# Patient Record
Sex: Female | Born: 1945
Health system: Southern US, Community
[De-identification: ages and names within clinical notes are randomized; demographics above are authoritative.]

## PROBLEM LIST (undated history)

## (undated) DIAGNOSIS — E785 Hyperlipidemia, unspecified: Secondary | ICD-10-CM

## (undated) DIAGNOSIS — R7303 Prediabetes: Secondary | ICD-10-CM

## (undated) DIAGNOSIS — E039 Hypothyroidism, unspecified: Secondary | ICD-10-CM

## (undated) DIAGNOSIS — F329 Major depressive disorder, single episode, unspecified: Secondary | ICD-10-CM

## (undated) DIAGNOSIS — Z9889 Other specified postprocedural states: Secondary | ICD-10-CM

## (undated) DIAGNOSIS — I4510 Unspecified right bundle-branch block: Secondary | ICD-10-CM

## (undated) DIAGNOSIS — M1990 Unspecified osteoarthritis, unspecified site: Secondary | ICD-10-CM

## (undated) DIAGNOSIS — T7840XA Allergy, unspecified, initial encounter: Secondary | ICD-10-CM

## (undated) DIAGNOSIS — M8580 Other specified disorders of bone density and structure, unspecified site: Secondary | ICD-10-CM

## (undated) DIAGNOSIS — G250 Essential tremor: Secondary | ICD-10-CM

## (undated) DIAGNOSIS — F32A Depression, unspecified: Secondary | ICD-10-CM

## (undated) DIAGNOSIS — I201 Angina pectoris with documented spasm: Secondary | ICD-10-CM

## (undated) DIAGNOSIS — F419 Anxiety disorder, unspecified: Secondary | ICD-10-CM

## (undated) DIAGNOSIS — I6529 Occlusion and stenosis of unspecified carotid artery: Secondary | ICD-10-CM

## (undated) DIAGNOSIS — R519 Headache, unspecified: Secondary | ICD-10-CM

## (undated) DIAGNOSIS — I2510 Atherosclerotic heart disease of native coronary artery without angina pectoris: Secondary | ICD-10-CM

## (undated) DIAGNOSIS — I10 Essential (primary) hypertension: Secondary | ICD-10-CM

## (undated) DIAGNOSIS — R112 Nausea with vomiting, unspecified: Secondary | ICD-10-CM

## (undated) HISTORY — DX: Hyperlipidemia, unspecified: E78.5

## (undated) HISTORY — DX: Essential tremor: G25.0

## (undated) HISTORY — PX: TYMPANOSTOMY TUBE PLACEMENT: SHX32

## (undated) HISTORY — DX: Occlusion and stenosis of unspecified carotid artery: I65.29

## (undated) HISTORY — PX: TOTAL ABDOMINAL HYSTERECTOMY W/ BILATERAL SALPINGOOPHORECTOMY: SHX83

## (undated) HISTORY — DX: Major depressive disorder, single episode, unspecified: F32.9

## (undated) HISTORY — DX: Allergy, unspecified, initial encounter: T78.40XA

## (undated) HISTORY — PX: KNEE SURGERY: SHX244

## (undated) HISTORY — DX: Angina pectoris with documented spasm: I20.1

## (undated) HISTORY — DX: Depression, unspecified: F32.A

## (undated) HISTORY — DX: Hypothyroidism, unspecified: E03.9

## (undated) HISTORY — DX: Essential (primary) hypertension: I10

## (undated) HISTORY — PX: THYROIDECTOMY: SHX17

## (undated) HISTORY — DX: Other specified disorders of bone density and structure, unspecified site: M85.80

## (undated) HISTORY — PX: CATARACT EXTRACTION W/ INTRAOCULAR LENS IMPLANT: SHX1309

## (undated) HISTORY — DX: Unspecified right bundle-branch block: I45.10

---

## 2002-05-19 ENCOUNTER — Encounter: Payer: Self-pay | Admitting: Emergency Medicine

## 2002-05-19 ENCOUNTER — Emergency Department (HOSPITAL_COMMUNITY): Admission: EM | Admit: 2002-05-19 | Discharge: 2002-05-19 | Payer: Self-pay | Admitting: Emergency Medicine

## 2002-12-28 ENCOUNTER — Ambulatory Visit (HOSPITAL_COMMUNITY): Admission: RE | Admit: 2002-12-28 | Discharge: 2002-12-28 | Payer: Self-pay | Admitting: Family Medicine

## 2003-01-24 ENCOUNTER — Encounter: Admission: RE | Admit: 2003-01-24 | Discharge: 2003-01-24 | Payer: Self-pay | Admitting: Family Medicine

## 2003-02-01 ENCOUNTER — Ambulatory Visit (HOSPITAL_COMMUNITY): Admission: RE | Admit: 2003-02-01 | Discharge: 2003-02-01 | Payer: Self-pay | Admitting: Orthopedic Surgery

## 2005-07-23 ENCOUNTER — Encounter: Admission: RE | Admit: 2005-07-23 | Discharge: 2005-07-23 | Payer: Self-pay | Admitting: Family Medicine

## 2005-08-09 HISTORY — PX: OTHER SURGICAL HISTORY: SHX169

## 2006-05-10 ENCOUNTER — Encounter: Admission: RE | Admit: 2006-05-10 | Discharge: 2006-05-10 | Payer: Self-pay | Admitting: Family Medicine

## 2006-05-17 ENCOUNTER — Encounter: Admission: RE | Admit: 2006-05-17 | Discharge: 2006-05-17 | Payer: Self-pay | Admitting: Family Medicine

## 2007-02-14 ENCOUNTER — Encounter: Admission: RE | Admit: 2007-02-14 | Discharge: 2007-02-14 | Payer: Self-pay | Admitting: Occupational Medicine

## 2007-04-26 ENCOUNTER — Encounter: Admission: RE | Admit: 2007-04-26 | Discharge: 2007-04-26 | Payer: Self-pay | Admitting: Internal Medicine

## 2007-10-18 ENCOUNTER — Encounter: Admission: RE | Admit: 2007-10-18 | Discharge: 2007-12-16 | Payer: Self-pay | Admitting: Occupational Medicine

## 2008-01-07 ENCOUNTER — Ambulatory Visit: Payer: Self-pay | Admitting: Internal Medicine

## 2008-01-07 ENCOUNTER — Inpatient Hospital Stay (HOSPITAL_COMMUNITY): Admission: EM | Admit: 2008-01-07 | Discharge: 2008-01-10 | Payer: Self-pay | Admitting: Emergency Medicine

## 2008-01-09 ENCOUNTER — Ambulatory Visit: Payer: Self-pay | Admitting: Vascular Surgery

## 2008-01-10 HISTORY — PX: CARDIAC CATHETERIZATION: SHX172

## 2010-03-01 ENCOUNTER — Encounter: Payer: Self-pay | Admitting: Family Medicine

## 2010-06-24 NOTE — Consult Note (Signed)
NAMEMAYBREE, RILING              ACCOUNT NO.:  [id number]   MEDICAL RECORD NO.:  [id number]          PATIENT TYPE:  INP   LOCATION:  3734                         FACILITY:  [facility]   PHYSICIAN:  Armanda Magic, M.D.     DATE OF BIRTH:  1945/06/14   DATE OF CONSULTATION:  01/09/2008  DATE OF DISCHARGE:                                 CONSULTATION   REFERRING PHYSICIAN:  [facility], Dr. Rito Ehrlich.   CHIEF COMPLAINT:  Elevated troponin and cardiac enzymes.   HISTORY OF PRESENT ILLNESS:  This is a 65 year old white female with a  history of hypertension and dyslipidemia who was admitted to the  emergency room on January 06, 2008, with acute mental status changes.  Apparently, she was home watching her grandchildren and all of a sudden  she did not feel well.  Apparently, she had been having some  intermittent nausea for several days.  She said she got real upset  because her 55-year-old had a rash.  She was waiting for his mom to get  there to take him to the doctor.  She said all of a sudden she did not  feel well.  She called her daughter to come over and then she called the  EMS.  She did not have any syncopal episode and does not remember  anything though from the episode.  She says she did not remember having  any chest pain, but she said she told the EMS that she did have chest  pain.  She has not had any episodes of chest pain or shortness of breath  in the past.  Initially, she was admitted with a diagnosis of TIA, but  then was found to have elevated cardiac enzymes consistent with non-ST  elevation MI.  We are now asked to consult.   PAST MEDICAL HISTORY:  1. Hypertension.  2. Dyslipidemia.  3. Anxiety.  4. Hypothyroidism.   PAST SURGICAL HISTORY:  Thyroid surgery and total abdominal  hysterectomy.   SOCIAL HISTORY:  She lives alone.  She denies any tobacco or alcohol  use.  Nor drug use.   FAMILY HISTORY:  Her sister who is younger than her died of an MI.  She  was  a smoker.  Her brother also died of an MI.  She has a sister who had  a TIA.   ALLERGIES:  She has no known drug allergies.   MEDICATIONS AT HOME:  1. Propranolol 80 mg daily.  2. Aspirin.  3. Paxil 20 mg a day.  4. Simvastatin 40 mg a day.  5. Armour Thyroid 60 mg daily.  6. Lisinopril HCT 20/25 mg daily.   REVIEW OF SYSTEMS:  All 14 review of systems is negative except what is  stated in the HPI.   PHYSICAL EXAMINATION:  VITAL SIGNS:  Blood pressure 143/81, heart rate  87, respirations 20, and O2 saturations 94% on room air.  GENERAL:  A well-developed and well-nourished female in no acute  distress.  HEENT:  Benign.  NECK:  Supple without lymphadenopathy.  Carotid upstrokes +2  bilaterally.  No bruits.  LUNGS:  Clear to auscultation throughout.  HEART:  Regular rate and rhythm.  No murmurs, rubs, or gallops.  Normal  S1 and S2.  ABDOMEN:  Soft, nontender, and nondistended with active bowel sounds.  No hepatosplenomegaly.  No abdominal bruits.  No pulsatile masses.  EXTREMITIES:  No cyanosis, erythema, or edema.   EKG shows sinus rhythm with right bundle-branch block.  Head CT was  negative.  MRI/MRA of the head was negative as well.  UA was negative.   LABORATORY DATA:  CPK is 92, 105, 99, 159, 116, 91, 83.  MB 2.0, 5.6, 6,  10.9, 11.2, 5.7, 3.8.  Troponin 0.04, 0.48, 0.53, 1.25, 0.92, 0.58,  0.49.  D-dimer less than 0.22.  Sodium 140, potassium 3.0, chloride 103,  bicarb 27, BUN 6, creatinine 0.48, glucose was not obtained.  White cell  count 7.1, hemoglobin is 14.8, hematocrit 44.2, platelet count 207.   ASSESSMENT:  1. Acute mental status changes possibly secondary to non-ST-elevation      myocardial infarction.  2. Elevated cardiac enzymes with a questionable episode of chest pain      and also nausea for several days.  Cardiac enzymes have peaked and      are trending downward.  Enzyme pattern is consistent with a small      non-ST-elevation myocardial infarction.   The patient does have a      very strong family history of coronary disease with myocardial      infarctions resulting in death in her younger sister and brother.  3. Hypokalemia.  4. Hypertension.  5. Strong family history of coronary disease in early age.  6. Dyslipidemia.   PLAN:  1. Check 2D echocardiogram to evaluate LV function.  We will need cath      and plan for January 10, 2008, for cardiac catheterization to      evaluate coronary anatomy.  2. Check a BMET in the morning.  3. Continue beta-blocker.  4. Change DVT heparin to full dose heparin for non-ST-elevation MI per      pharmacy.  5. Add Aspirin 325 mg daily.  6. Hold hydrochlorothiazide.      Armanda Magic, M.D.  Electronically Signed     TT/MEDQ  D:  01/09/2008  T:  01/09/2008  Job:  [id number]   cc:   Donia Guiles, M.D.

## 2010-06-24 NOTE — Discharge Summary (Signed)
Vanessa Stewart, CRAYTON              ACCOUNT NO.:  [id number]   MEDICAL RECORD NO.:  [id number]          PATIENT TYPE:  INP   LOCATION:  3734                         FACILITY:  [facility]   PHYSICIAN:  Ramiro Harvest, MD    DATE OF BIRTH:  [birth year]   DATE OF ADMISSION:  01/06/2008  DATE OF DISCHARGE:  01/10/2008                               DISCHARGE SUMMARY   ATTENDING PHYSICIAN:  Ramiro Harvest, M.D.   PRIMARY CARE PHYSICIAN:  Donia Guiles, M.D.   DISCHARGE DIAGNOSES:  1. Altered mental status likely secondary to non-ST elevated      myocardial infarction versus transient ischemic attack.  2. Non-ST elevated myocardial infarction secondary to esophageal      spasm.  3. Esophageal spasm.  4. Hypertension.  5. Hyperlipidemia.  6. Anxiety.  7. Hypothyroidism.  8. Status post total abdominal hysterectomy.  9. Status post thyroid surgery.   DISCHARGE MEDICATIONS:  1. Norvasc 5 mg p.o. daily  2. Simvastatin 80 mg p.o. daily  3. Aspirin 325 mg p.o. daily  4. Paroxetine  20 mg p.o. daily.  5. Lisinopril/ HCTZ 20/25 one tablet p.o. daily.  6. Armour thyroid 60 mg p.o. daily.  7. Zyrtec take as previously taken.   DISPOSITION/FOLLOWUP:  The patient will be discharged home.  The patient  is to follow up with Dr. Mayford Knife of [facility] in 2 weeks.  The  patient is also to follow up with his primary care physician, Dr.  Arvilla Market in 1-2 weeks.  On followup, the patient's blood pressure needs  to be reassessed as a propranolol has been changed to Norvasc.  The patient will likely need a comprehensive metabolic profile done in  about 6 weeks to follow up on her LFTs as the patient's Zocor been  increased to 80 mg daily.   CONSULTATIONS:  A cardiology consult was done.  The patient was seen in  consultation by Dr. Armanda Magic of [facility] on December 23, 2007.   PROCEDURE PERFORMED:  1. A CT of the head without contrast was performed on January 06, 2008 that  showed no acute intracranial abnormality.  2. An MRI/MRA of the head and neck was performed on January 07, 2008      which showed no acute intracranial findings.  No change from prior      CT.  No vascular stenosis or occlusion. Negative MRA of the neck.  3. Carotid Dopplers were done on January 09, 2008, that showed mild      intimal thickening noted throughout with mild area of known      calcific plaque noted in the left distal CCA/bifurcation.      Vertebral artery flow is antegrade bilaterally.  No significant      proximal right ICA stenosis noted.  The distal ICA was difficult to      visualize.  No significant left ICA stenosis noted.  4. A 2-D echo was obtained on December 23, 2007 that showed overall      left ventricular systolic function was normal, EF of 55-60%.  There  was no left ventricular regional wall motion abnormalities.  Left      ventricular wall thickness was mildly to moderately increased.  The      left atrium was moderately dilated.  Right ventricular size was      upper limits of normal.  Estimated peak right ventricular systolic      pressure was within the upper limits of normal.  Estimated peak      right ventricular systolic pressure was in the range of 25-30 mmHg.      The right atrium was mildly dilated.  No obvious cardioembolic      source identified.  Consider TEE if clinically warranted.  5. A cardiac catheterization was done on January 10, 2008 that showed      some coronary vasal spasm, normal coronary arteries, normal left      ventricle.   BRIEF ADMISSION HISTORY AND PHYSICAL:  Ms. Vanessa Stewart is a 65-year-  old white female with history of hypertension and hyperlipidemia brought  in by EMS.  Not clear about the details of the event.  There were no  family members except for two grandchildren with her when the episode  happened, but based on the patchy history that was obtained from the  patient and the family members, it seemed as if  she had been feeling  nauseated for the last few days and on the day of admission was not  feeling well.  Apparently she called 9-1-1 and was found to be confused.  Was brought in by EMS.  The patient currently was at her baseline at the  time she was seen by the admitting physician and told the admitting  physician that she had no similar episodes in the past.  The patient  denied any nausea at that time.  Did have some nausea earlier which had  resolved with medication.  The patient denied any shortness of breath.  The patient denied any chest pain, no dizziness, no weakness of  extremities or problems swallowing.  No recent travels.  The patient did  tell the admitting physician that thyroid medication had recently been  changed to a generic prescription. The patient does check a blood  pressure at home and systolic was usually 130s with diastolic 70s to  16X.  The patient had told the admitting physician she had some carotid  Dopplers done by PCP.  However, was told that the plaque burden was  normal for age.   PHYSICAL EXAMINATION:  VITAL SIGNS:  Per admitting physician,  temperature 98, pulse of 69 to 78, respiratory rate 16-20, blood  pressure 134/68 which was down from 156/89, satting 98% on room air.  GENERAL:  The patient is a white female, looking younger than her stated  age, in no acute distress, alert and oriented.  HEENT: Normocephalic, atraumatic.  Pupils equal, round and reactive to  light and accommodation.  Extraocular movements intact.  Oropharynx was  clear.  No lesions.  No exudates.  Neck was supple.  No lymphadenopathy.  No icterus, no pallor noted.  CARDIOVASCULAR:  Regular rate and rhythm.  No murmurs heard.  RESPIRATORY:  Lungs were clear to auscultation bilaterally.  No wheezes,  no rhonchi.  No crackles.  ABDOMEN:  Soft, nontender, nondistended,  positive bowel sounds.  EXTREMITIES:  No clubbing, cyanosis or edema.  NEUROLOGIC:  The patient was alert and  oriented x3.  Cranial nerves II-  XII grossly intact.  No focal deficits.   ADMISSION LABORATORY DATA:  EKG with a right bundle branch block.  No  previous EKGs for comparison.  CBC showed white count 5.8, hemoglobin  14.9, hematocrit 43.5, platelets of 212,000.  Sodium 137, potassium 2.8,  chloride 101, bicarb 27, glucose 110, BUN 10, creatinine 0.5, calcium of  8.8, albumin of 4, AST 18%.  Cardiac enzymes were negative.  CT of the  head as stated above.  Urinalysis was negative for UTI.  Urine  toxicology was negative.  Alcohol level was less than 5.   HOSPITAL COURSE:  1. Altered mental status.  Initially it was felt that the patient's      altered mental status could likely be secondary to a TIA.  A stroke      workup was undertaken.  TSH levels were obtained that came back      negative.  Cardiac enzymes  were obtained which came back elevated.      A CT of the head was obtained with results as stated above. MRI was      also obtained with results as stated above.  Homocysteine level was      obtained with results of 7.5 which was within normal limits.  A D-      dimer was obtained which was negative.  The patient's alcohol level      was obtained which was less than 5.  The patient had returned to      her baseline mentation by the time she was seen by the admitting      physician.  The patient remained stable.  Cardiology was consulted      as it was felt that the patient's altered mental status may be      likely secondary to a non-ST elevated MI.  The patient was seen in      consultation by cardiology.  A fasting lipid panel was also      obtained which showed an increased LDL of 114.  As the patient had      had a possible TIA versus a non-ST elevated MI, it was noted that      the patient's Zocor needed to be increased to 80 mg daily with      follow-up as an outpatient with her PCP.  The patient remained      stable and asymptomatic throughout the rest of the  hospitalization.      The patient was discharged in stable condition.  For non-ST      elevated MI, see problem #2.  2. Non-ST elevated MI.  During the patient's workup to rule out CVA      versus a TIA, it was noted that the patient did have elevated      cardiac enzymes.  Cardiology was consulted.  The patient was seen      in consultation by cardiology.  The patient was restarted back on      aspirin, was placed on the Zocor.  Zocor levels were increased      secondary to LDL level of 114.  The patient remained asymptomatic      and chest pain free throughout the hospitalization.  A 2-D echo was      also obtained with results as stated above.  The patient was taken      to the cardiac catheterization lab.  Had a catheterization done by      Dr. Armanda Magic on January 10, 2008 which also was normal      coronaries.  It  was felt the patient's non-ST elevated MI was      secondary to esophageal spasm and the recommendations were made to      discontinue the patient's propranolol and place her on Norvasc 5 mg      daily and to follow up as outpatient.  The patient remained in      stable and improved condition by day of discharge.  The patient      will be discharged in stable condition.  3. Hyperlipidemia.  During the hospitalization a total cholesterol      panel was obtained.  The patient was noted to have a cholesterol      level of 182 and triglycerides of 163, HDL of 35 and LDL of 114.      Due to the patient having a non-ST elevated MI secondary to      esophageal spasm and a history of hypertension and hyperlipidemia,      the patient's Zocor was increased to 80 mg daily.  This will need      to be followed up as an outpatient per primary care physician.   The rest of the patient's chronic medical issues remained stable  throughout the hospitalization.  The patient will be discharged in  stable and improved condition.  On day of discharge vital signs showed  temperature 97.8,  pulse of 65, blood pressure 116/69, respiratory rate  16, satting 96% on room air.   DISCHARGE LABORATORY DATA:  Sodium 140, potassium 3.7, chloride 103,  bicarb 29, BUN 8, creatinine 0.55, glucose of 104, calcium of 9.3.  CBC  showed a white count 6.6, hemoglobin of 14.6, platelets of 177,000, a  hematocrit of 43.6.   It has been a pleasure taking care of Ms. Rosalyn Gess.      Ramiro Harvest, MD  Electronically Signed     DT/MEDQ  D:  01/10/2008  T:  01/10/2008  Job:  [id number]   cc:   Donia Guiles, M.D.  Armanda Magic, M.D.

## 2010-06-24 NOTE — H&P (Signed)
Vanessa Stewart, Vanessa Stewart              ACCOUNT NO.:  [id number]   MEDICAL RECORD NO.:  [id number]          PATIENT TYPE:  INP   LOCATION:  3001                         FACILITY:  [facility]   PHYSICIAN:  Joylene John, MD       DATE OF BIRTH:  1945/04/13   DATE OF ADMISSION:  01/06/2008  DATE OF DISCHARGE:                              HISTORY & PHYSICAL   REASON FOR ADMISSION:  Confusion and what seems like to be a TIA  episode.   HISTORY OF PRESENT ILLNESS:  This is a 65 year old white female with  past medical history of hypertension and hyperlipidemia, brought in by  EMS.  The patient is not clear the details about the event.  There were  no family members except for her 2 grand kids with her when the episode  happened, but based on the patchy history that is obtained from the  patient and the family members, it seems that she has been feeling  nauseated for last few days and that today she was not feeling well.  Apparently, she called 911, was found to be confused, was brought in by  EMS.  The patient currently back to her baseline, tells me that she has  had no similar episodes in the past.  Denies any nausea right now.  She  did have some nausea earlier which has resolved with medication.  She  denies any shortness of breath, chest pain, dizziness, or any weakness  of her extremities or problems swallowing.  No recent travels.  The  patient does tell me that her thyroid medication was recently changed to  a generic portion.  The patient does check her blood pressure at home,  usually systolic is in the 130s, diastolic in the 70-80s.  The patient  does tell me that she did have carotid Dopplers by her PCP; however, she  was told that the plaque burden was normal for her age.   PAST MEDICAL HISTORY:  Anxiety, hyperlipidemia, hypertension, and  hypothyroidism.   PAST SURGICAL HISTORY:  Thyroid surgery many years ago and hysterectomy.   SOCIAL HISTORY:  No alcohol, drugs, or smoking.   Lives by herself.   FAMILY HISTORY:  Sister with TIA.   No allergies to food or medicines.   HOME MEDICATIONS:  Propranolol 80 mg once daily, aspirin, Paxil 20 mg  once daily, simvastatin 40 mg once daily, Armour Thyroid 60 mg once  daily, and lisinopril and hydrochlorothiazide combination 20/25 mg once  daily.   REVIEW OF SYSTEMS:  Please refer to the HPI, otherwise the 14-point  review of systems is negative.   PHYSICAL EXAMINATION:  VITAL SIGNS:  Temperature of 98, pulse of 69-78,  respirations 16-20, and blood pressure is 134/68.  Originally, when she  came in, blood pressure was 156/89.  The patient is sating 98% on room  air.  GENERAL:  This is a white female, looks younger than her stated age, in  no acute distress, alert and oriented.  HEENT:  No icterus.  No pallor noted.  NECK:  Supple.  No JVD appreciated.  No oral lesions noted.  CARDIOVASCULAR:  Regular rate and rhythm.  No murmurs heard.  LUNGS:  Clear to auscultation.  ABDOMEN:  Belly is soft.  Bowel sounds present.  LOWER EXTREMITIES:  No edema.  NEUROLOGIC:  Nonfocal.   Her EKG shows a right bundle-branch block; however, there are no  previous EKGs to compare, so plan to repeat an EKG in the morning.  Her  pertinent labs show the following:  She has a white count of 5.8,  hemoglobin 14.9, crit of 43.5, and platelets 212.  Sodium 137, potassium  2.8, chloride 101, bicarb 27, glucose 110, BUN 10, creatinine 0.5, and  calcium is 8.8.  Albumin is 4.  AST is 18 and ALT.  Her first set of  cardiac enzymes are negative.  CT head did not show any acute pathology.  Urinalysis was negative for UTI and urine tox was done which was  negative.  Alcohol level was less than 5, meaning negative.   ASSESSMENT AND PLAN:  This is a 65 year old white female with history of  hypertension and hyperlipidemia, who comes in with what seems like  transient ischemic attack.  Plan is to admit the patient to a tele bed  to do serial  cardiac enzymes, echo and carotid Dopplers in the morning.  Repeat labs in the morning.  We will add magnesium level to the labs  since her potassium was a little low.  We will continue her home  medications except we will change propranolol to metoprolol 25 mg q.12  h. with whole parameters.  If the patient's workup is negative, then she  probably can be discharged with followup with PCP.      Joylene John, MD  Electronically Signed     RP/MEDQ  D:  01/06/2008  T:  01/07/2008  Job:  [id number]

## 2010-06-24 NOTE — Cardiovascular Report (Signed)
NAMEJUANA, HARALSON              ACCOUNT NO.:  [id number]   MEDICAL RECORD NO.:  [id number]          PATIENT TYPE:  INP   LOCATION:  3734                         FACILITY:  [facility]   PHYSICIAN:  Armanda Magic, M.D.     DATE OF BIRTH:  [birth year]   DATE OF PROCEDURE:  DATE OF DISCHARGE:  01/10/2008                            CARDIAC CATHETERIZATION   REFERRING PHYSICIAN:  [facility].   PROCEDURES:  1. Left heart catheterization.  2. Coronary angiography.  3. Left ventriculography.   OPERATOR:  Armanda Magic, MD   INDICATIONS:  Non-ST-elevation myocardial infarction.   COMPLICATIONS:  None.   INTRAVENOUS ACCESS:  Via right femoral artery 6-French sheath   INTRAVENOUS MEDICATIONS:  Versed 1 mg, fentanyl 25 mcg.   This is a 65 year old female who recently presented with an episode of  mental status changes and some chest pain.  She presented to the  emergency room with possible TIA but was found to have elevated cardiac  enzymes consistent with non-ST-elevation MI.  She now presents for  cardiac catheterization.   The patient was brought to the Cardiac Catheterization Laboratory in a  fasting nonsedated state.  An informed consent was obtained.  The  patient was connected to continuous heart rate and pulse oximetry  monitor, intermittent blood pressure monitoring.  The right groin was  prepped and draped in a sterile fashion.  A 1% Xylocaine was used for  local anesthesia.  Using the modified Seldinger technique, a 6-French  sheath was placed in the right femoral artery.  Under fluoroscopic  guidance, a 6-French JL-4 catheter was placed in the left coronary  artery.  Multiple cine films taken at 3-degree RAO, 4-degree LAO views.  This catheter was then exchanged out over a guidewire for a 6-French JR-  4 catheter, which was placed under fluoroscopic guidance in the right  coronary artery.  Multiple cine films were taken at 3-degree RAO, 4-  degree LAO views.  Catheter  was then exchanged out over a guidewire for  a 6-French angled-pigtail catheter, which was placed under fluoroscopic  guidance in the left ventricular cavity.  Left ventriculography was  performed in a 3-degree RAO view using a total of 30 mL of contrast at  15 mL per second.  The catheter was then pulled back across the aortic  valve with no significant gradient noted.  At the end of procedure, all  catheters and sheaths were removed.  Manual compression was performed  until adequate hemostasis was obtained.  The patient was transferred  back to room in stable condition.   RESULTS:  1. The left main coronary artery is widely patent and bifurcates into      left anterior descending artery and left circumflex artery.  2. The left anterior descending artery is widely patent throughout its      course.  The apex giving rise to 1 diagonal branch, which is widely      patent.  3. The left circumflex is widely patent throughout its course in the      AV groove.  It gives rise to a small first obtuse  marginal branch      and then a second much larger obtuse marginal-2 branch, which      bifurcates into 2 daughter branches both of which are widely      patent.  4. The right coronary artery is widely patent throughout its course      and distally bifurcates into posterior descending artery and      posterolateral artery.  5. Left ventriculography shows normal LV function, EF 60%, left      ventricular pressure 152/0, aortic pressure 150/72 mmHg, LVEDP 5      mmHg.   ASSESSMENT:  1. Non-ST-elevation myocardial infarction possibly secondary to      vasospasm.  2. Normal coronary artery.  3. Normal left ventricular function.   PLAN:  IV fluid and bedrest for 6 hours.  We will discontinue  propranolol and change Norvasc 5 mg a day for possible vasospasm, will  follow up with me in 2 weeks, continue aspirin.      Armanda Magic, M.D.  Electronically Signed     TT/MEDQ  D:  01/10/2008   T:  01/10/2008  Job:  [id number]   cc:   Donia Guiles, M.D.

## 2010-09-22 ENCOUNTER — Other Ambulatory Visit: Payer: Self-pay | Admitting: Family Medicine

## 2010-09-22 ENCOUNTER — Ambulatory Visit
Admission: RE | Admit: 2010-09-22 | Discharge: 2010-09-22 | Disposition: A | Discharge status: Home / Self Care | Payer: BC Managed Care – PPO | Source: Ambulatory Visit | Attending: Family Medicine | Admitting: Family Medicine

## 2010-09-22 DIAGNOSIS — M25562 Pain in left knee: Secondary | ICD-10-CM

## 2010-11-11 LAB — CARDIAC PANEL(CRET KIN+CKTOT+MB+TROPI)
CK, MB: 6 ng/mL — ABNORMAL HIGH (ref 0.3–4.0)
Relative Index: 10.9 — ABNORMAL HIGH (ref 0.0–2.5)
Relative Index: INVALID (ref 0.0–2.5)
Troponin I: 0.53 ng/mL (ref 0.00–0.06)
Troponin I: 1.25 ng/mL (ref 0.00–0.06)

## 2010-11-11 LAB — MAGNESIUM: Magnesium: 2.3 mg/dL (ref 1.5–2.5)

## 2010-11-11 LAB — URINALYSIS, ROUTINE W REFLEX MICROSCOPIC
Bilirubin Urine: NEGATIVE
Glucose, UA: NEGATIVE mg/dL
Nitrite: NEGATIVE
Protein, ur: 100 mg/dL — AB
Specific Gravity, Urine: 1.014 (ref 1.005–1.030)
Urobilinogen, UA: 0.2 mg/dL (ref 0.0–1.0)
pH: 7.5 (ref 5.0–8.0)

## 2010-11-11 LAB — TSH
TSH: 1.549 u[IU]/mL (ref 0.350–4.500)
TSH: 2.33 u[IU]/mL (ref 0.350–4.500)

## 2010-11-11 LAB — PROTIME-INR
INR: 0.9 (ref 0.00–1.49)
Prothrombin Time: 12.7 seconds (ref 11.6–15.2)

## 2010-11-11 LAB — CBC
HCT: 43.5 % (ref 36.0–46.0)
HCT: 44.1 % (ref 36.0–46.0)
Hemoglobin: 14.8 g/dL (ref 12.0–15.0)
Hemoglobin: 14.9 g/dL (ref 12.0–15.0)
Platelets: 182 10*3/uL (ref 150–400)
RBC: 4.81 MIL/uL (ref 3.87–5.11)
RBC: 4.86 MIL/uL (ref 3.87–5.11)
RDW: 13 % (ref 11.5–15.5)
RDW: 13.1 % (ref 11.5–15.5)
RDW: 13.5 % (ref 11.5–15.5)
WBC: 7.1 10*3/uL (ref 4.0–10.5)
WBC: 7.5 10*3/uL (ref 4.0–10.5)

## 2010-11-11 LAB — COMPREHENSIVE METABOLIC PANEL
ALT: 11 U/L (ref 0–35)
AST: 19 U/L (ref 0–37)
Albumin: 4 g/dL (ref 3.5–5.2)
Alkaline Phosphatase: 69 U/L (ref 39–117)
BUN: 10 mg/dL (ref 6–23)
CO2: 27 mEq/L (ref 19–32)
Calcium: 8.8 mg/dL (ref 8.4–10.5)
Calcium: 9.1 mg/dL (ref 8.4–10.5)
Chloride: 101 mEq/L (ref 96–112)
Creatinine, Ser: 0.48 mg/dL (ref 0.4–1.2)
GFR calc Af Amer: >60 mL/min (ref >60)
GFR calc Af Amer: >60 mL/min (ref >60)
Sodium: 140 mEq/L (ref 135–145)
Total Protein: 6.7 g/dL (ref 6.0–8.3)

## 2010-11-11 LAB — RAPID URINE DRUG SCREEN, HOSP PERFORMED
Benzodiazepines: NOT DETECTED
Cocaine: NOT DETECTED
Tetrahydrocannabinol: NOT DETECTED

## 2010-11-11 LAB — GLUCOSE, CAPILLARY: Glucose-Capillary: 135 mg/dL — ABNORMAL HIGH (ref 70–99)

## 2010-11-11 LAB — APTT: aPTT: 29 seconds (ref 24–37)

## 2010-11-11 LAB — CK TOTAL AND CKMB (NOT AT ARMC)
CK, MB: 3.8 ng/mL (ref 0.3–4.0)
CK, MB: 5.6 ng/mL — ABNORMAL HIGH (ref 0.3–4.0)
Relative Index: 5.3 — ABNORMAL HIGH (ref 0.0–2.5)
Relative Index: 9.7 — ABNORMAL HIGH (ref 0.0–2.5)
Relative Index: INVALID (ref 0.0–2.5)
Total CK: 105 U/L (ref 7–177)
Total CK: 116 U/L (ref 7–177)

## 2010-11-11 LAB — DIFFERENTIAL
Basophils Relative: 1 % (ref 0–1)
Eosinophils Absolute: 0.2 10*3/uL (ref 0.0–0.7)
Eosinophils Relative: 3 % (ref 0–5)
Monocytes Absolute: 0.4 10*3/uL (ref 0.1–1.0)
Neutro Abs: 3.4 10*3/uL (ref 1.7–7.7)
Neutrophils Relative %: 58 % (ref 43–77)

## 2010-11-11 LAB — TROPONIN I
Troponin I: 0.49 ng/mL — ABNORMAL HIGH (ref 0.00–0.06)
Troponin I: 0.58 ng/mL (ref 0.00–0.06)
Troponin I: 0.92 ng/mL (ref 0.00–0.06)

## 2010-11-11 LAB — HOMOCYSTEINE: Homocysteine: 7.5 umol/L (ref 4.0–15.4)

## 2010-11-11 LAB — URINE MICROSCOPIC-ADD ON

## 2010-11-11 LAB — HEPARIN LEVEL (UNFRACTIONATED): Heparin Unfractionated: 0.86 IU/mL — ABNORMAL HIGH (ref 0.30–0.70)

## 2010-11-11 LAB — LIPID PANEL
Cholesterol: 182 mg/dL (ref 0–200)
LDL Cholesterol: 114 mg/dL — ABNORMAL HIGH (ref 0–99)
Total CHOL/HDL Ratio: 5.2 RATIO

## 2010-11-14 LAB — BASIC METABOLIC PANEL
Calcium: 9.3 mg/dL (ref 8.4–10.5)
GFR calc Af Amer: >60 mL/min (ref >60)
GFR calc non Af Amer: >60 mL/min (ref >60)
Sodium: 140 mEq/L (ref 135–145)

## 2010-11-14 LAB — CBC
Hemoglobin: 14.6 g/dL (ref 12.0–15.0)
RBC: 4.75 MIL/uL (ref 3.87–5.11)

## 2012-08-22 ENCOUNTER — Ambulatory Visit
Admission: RE | Admit: 2012-08-22 | Discharge: 2012-08-22 | Disposition: A | Discharge status: Home / Self Care | Payer: Medicare Other | Source: Ambulatory Visit | Attending: Family Medicine | Admitting: Family Medicine

## 2012-08-22 ENCOUNTER — Other Ambulatory Visit: Payer: Self-pay | Admitting: Family Medicine

## 2012-08-22 DIAGNOSIS — IMO0002 Reserved for concepts with insufficient information to code with codable children: Secondary | ICD-10-CM

## 2013-05-16 ENCOUNTER — Ambulatory Visit (INDEPENDENT_AMBULATORY_CARE_PROVIDER_SITE_OTHER): Payer: Medicare HMO | Admitting: Family Medicine

## 2013-05-16 VITALS — BP 130/82 | HR 77 | Temp 97.4°F | Resp 18 | Wt 162.0 lb

## 2013-05-16 DIAGNOSIS — H6120 Impacted cerumen, unspecified ear: Secondary | ICD-10-CM

## 2013-05-16 DIAGNOSIS — R42 Dizziness and giddiness: Secondary | ICD-10-CM

## 2013-05-16 DIAGNOSIS — H6590 Unspecified nonsuppurative otitis media, unspecified ear: Secondary | ICD-10-CM

## 2013-05-16 MED ORDER — AMOXICILLIN 875 MG PO TABS: 875.0000 mg | ORAL_TABLET | Freq: Two times a day (BID) | ORAL | Status: DC

## 2013-05-16 NOTE — Progress Notes (Signed)
This is a 68 year old retired woman who comes in with 24 hours of intermittent lightheadedness. She's had no vertigo, ear pain, or loss of hearing.  Patient's had no palpitations, chest pain, or shortness of breath. She also denies any edema of the legs.  Objective: No acute distress HEENT: Unremarkable except for cerumen impaction the right ear. This was debrided clear and she does have a mildly erythematous eardrum. Chest: Clear Heart: Regular no murmur Neck: No bruits, no thyromegaly  Assessment: Patient may have an early otitis, difficult to say. Her symptoms are mild  Nonsuppurative otitis media, not specified as acute or chronic - Plan: amoxicillin (AMOXIL) 875 MG tablet  Lightheadedness  Cerumen impaction  Signed, Robyn Haber, MD

## 2013-05-16 NOTE — Patient Instructions (Signed)
Otitis Media With Effusion Otitis media with effusion is the presence of fluid in the middle ear. This is a common problem in children, which often follows ear infections. It may be present for weeks or longer after the infection. Unlike an acute ear infection, otitis media with effusion refers only to fluid behind the ear drum and not infection. Children with repeated ear and sinus infections and allergy problems are the most likely to get otitis media with effusion. CAUSES  The most frequent cause of the fluid buildup is dysfunction of the eustachian tubes. These are the tubes that drain fluid in the ears to the to the back of the nose (nasopharynx). SYMPTOMS   The main symptom of this condition is hearing loss. As a result, you or your child may:  Listen to the TV at a loud volume.  Not respond to questions.  Ask "what" often when spoken to.  Mistake or confuse on sound or word for another.  There may be a sensation of fullness or pressure but usually not pain. DIAGNOSIS   Your health care provider will diagnose this condition by examining you or your child's ears.  Your health care provider may test the pressure in you or your child's ear with a tympanometer.  A hearing test may be conducted if the problem persists. TREATMENT   Treatment depends on the duration and the effects of the effusion.  Antibiotics, decongestants, nose drops, and cortisone-type drugs (tablets or nasal spray) may not be helpful.  Children with persistent ear effusions may have delayed language or behavioral problems. Children at risk for developmental delays in hearing, learning, and speech may require referral to a specialist earlier than children not at risk.  You or your child's health care provider may suggest a referral to an ear, nose, and throat surgeon for treatment. The following may help restore normal hearing:  Drainage of fluid.  Placement of ear tubes (tympanostomy tubes).  Removal of  adenoids (adenoidectomy). HOME CARE INSTRUCTIONS   Avoid second hand smoke.  Infants who are breast fed are less likely to have this condition.  Avoid feeding infants while laying flat.  Avoid known environmental allergens.  Avoid people who are sick. SEEK MEDICAL CARE IF:   Hearing is not better in 3 months.  Hearing is worse.  Ear pain.  Drainage from the ear.  Dizziness. MAKE SURE YOU:   Understand these instructions.  Will watch your condition.  Will get help right away if you are not doing well or get worse. Document Released: 03/05/2004 Document Revised: 11/16/2012 Document Reviewed: 08/23/2012 Exit[employer] Patient Information 2014 [employer].  

## 2013-06-06 ENCOUNTER — Ambulatory Visit (INDEPENDENT_AMBULATORY_CARE_PROVIDER_SITE_OTHER): Payer: Managed Care, Other (non HMO) | Admitting: *Deleted

## 2013-06-06 DIAGNOSIS — E7800 Pure hypercholesterolemia, unspecified: Secondary | ICD-10-CM

## 2013-06-06 LAB — LIPID PANEL
Cholesterol: 199 mg/dL (ref 0–200)
HDL: 47.4 mg/dL (ref >39.00)
LDL Cholesterol: 125 mg/dL — ABNORMAL HIGH (ref 0–99)
Total CHOL/HDL Ratio: 4
Triglycerides: 131 mg/dL (ref 0.0–149.0)
VLDL: 26.2 mg/dL (ref 0.0–40.0)

## 2013-06-06 LAB — ALT: ALT: 8 U/L (ref 0–35)

## 2013-06-09 ENCOUNTER — Telehealth: Payer: Self-pay | Admitting: Cardiology

## 2013-06-09 NOTE — Telephone Encounter (Signed)
New message  ° ° °Patient calling back for test results.  °

## 2013-06-09 NOTE — Telephone Encounter (Signed)
Called pt to give results.

## 2013-06-14 ENCOUNTER — Encounter: Payer: Self-pay | Admitting: General Surgery

## 2013-06-14 DIAGNOSIS — I201 Angina pectoris with documented spasm: Secondary | ICD-10-CM

## 2013-06-14 DIAGNOSIS — I4510 Unspecified right bundle-branch block: Secondary | ICD-10-CM

## 2013-06-14 DIAGNOSIS — I10 Essential (primary) hypertension: Secondary | ICD-10-CM

## 2013-06-14 DIAGNOSIS — E7800 Pure hypercholesterolemia, unspecified: Secondary | ICD-10-CM

## 2013-06-29 ENCOUNTER — Encounter (INDEPENDENT_AMBULATORY_CARE_PROVIDER_SITE_OTHER): Payer: Self-pay

## 2013-06-29 ENCOUNTER — Encounter: Payer: Self-pay | Admitting: Cardiology

## 2013-06-29 ENCOUNTER — Ambulatory Visit (INDEPENDENT_AMBULATORY_CARE_PROVIDER_SITE_OTHER): Payer: Managed Care, Other (non HMO) | Admitting: Cardiology

## 2013-06-29 VITALS — BP 144/78 | HR 72 | Ht 63.0 in | Wt 159.0 lb

## 2013-06-29 DIAGNOSIS — I201 Angina pectoris with documented spasm: Secondary | ICD-10-CM

## 2013-06-29 DIAGNOSIS — I4510 Unspecified right bundle-branch block: Secondary | ICD-10-CM

## 2013-06-29 DIAGNOSIS — E785 Hyperlipidemia, unspecified: Secondary | ICD-10-CM

## 2013-06-29 DIAGNOSIS — I10 Essential (primary) hypertension: Secondary | ICD-10-CM

## 2013-06-29 LAB — BASIC METABOLIC PANEL
BUN: 17 mg/dL (ref 6–23)
CO2: 28 meq/L (ref 19–32)
Calcium: 8.8 mg/dL (ref 8.4–10.5)
Chloride: 101 mEq/L (ref 96–112)
Creatinine, Ser: 0.5 mg/dL (ref 0.4–1.2)
GFR: 133.76 mL/min (ref >60.00)
Glucose, Bld: 103 mg/dL — ABNORMAL HIGH (ref 70–99)
POTASSIUM: 3.5 meq/L (ref 3.5–5.1)
SODIUM: 136 meq/L (ref 135–145)

## 2013-06-29 NOTE — Patient Instructions (Signed)
Your physician recommends that you continue on your current medications as directed. Please refer to the Current Medication list given to you today.  Your physician recommends that you go to the lab for a BMET  Your physician wants you to follow-up in: 1 year with Dr Mallie Snooks will receive a reminder letter in the mail two months in advance. If you don't receive a letter, please call our office to schedule the follow-up appointment.

## 2013-06-29 NOTE — Progress Notes (Signed)
[facility], [place], [place]  [address] Phone: [phone] Fax:  [phone]  Date:  06/29/2013   ID:  Vanessa Stewart, DOB April 14, 1945, MRN [id number]  PCP:  Mayra Neer, MD  Cardiologist:  Fransico Him, MD     History of Present Illness: Vanessa Stewart is a 68 y.o. female with a history of prinzmetal's angina, HTN and RBBB who presents today for followup.  She is doing well.  She denies any chest pain, SOB, DOE, LE edema, dizziness, palpitations or syncope.  She walks at the mall for exercise and uses the elliptical at the gym.    Wt Readings from Last 3 Encounters:  06/29/13 159 lb (72.122 kg)  05/16/13 162 lb (73.483 kg)     Past Medical History  Diagnosis Date  . Allergy   . Hypothyroidism   . Essential tremor   . Osteopenia   . Depression   . Hypertension   . Hyperlipidemia   . RBBB     Chronic  . Prinzmetal's angina     normal coronary arteries with coronary artery vasospasm at time of cath    Current Outpatient Prescriptions  Medication Sig Dispense Refill  . amoxicillin (AMOXIL) 875 MG tablet Take 1 tablet (875 mg total) by mouth 2 (two) times daily.  20 tablet  0  . aspirin 81 MG tablet Take 81 mg by mouth daily.      [name] diltiazem (CARDIZEM CD) 180 MG 24 hr capsule Take 180 mg by mouth daily.      [name] lisinopril-hydrochlorothiazide (PRINZIDE,ZESTORETIC) 20-25 MG per tablet Take 1 tablet by mouth daily.      [name] PARoxetine (PAXIL) 20 MG tablet Take 20 mg by mouth daily.      . simvastatin (ZOCOR) 40 MG tablet Take 40 mg by mouth daily.      [name] thyroid (ARMOUR) 60 MG tablet Take 60 mg by mouth daily before breakfast.       No current facility-administered medications for this visit.    Allergies:    Allergies  Allergen Reactions  . Septra [Sulfamethoxazole-Trimethoprim]     GI issues  . Sulfur     Social History:  The patient  reports that she has never smoked. She does not have any smokeless tobacco history on file. She reports that she  does not drink alcohol or use illicit drugs.   Family History:  The patient's family history includes Heart disease in her brother, brother, mother, and sister; Stroke in her father.   ROS:  Please see the history of present illness.      All other systems reviewed and negative.   PHYSICAL EXAM: VS:  BP 144/78  Pulse 72  Ht 5\' 3"  (1.6 m)  Wt 159 lb (72.122 kg)  BMI 28.17 kg/m2 Well nourished, well developed, in no acute distress HEENT: normal Neck: no JVD Cardiac:  normal S1, S2; RRR; no murmur Lungs:  clear to auscultation bilaterally, no wheezing, rhonchi or rales Abd: soft, nontender, no hepatomegaly Ext: no edema Skin: warm and dry Neuro:  CNs 2-12 intact, no focal abnormalities noted  EKG:   NSR with RBBB    ASSESSMENT AND PLAN: 1.  Prinzmetal's angina - with no angina - continue Cardizem 2.  HTN well controlled - continue Prinizide/Diltizazem - check BMET 3.  RBBB 4.  Dyslipidemia with LDL goal <130 with no history of CAD.  Her last LDL was 125, HDL 47 and TAG 131 - continue Zocor  Followup with  me in 1 year  Signed, Fransico Him, MD 06/29/2013 10:12 AM

## 2013-06-30 ENCOUNTER — Telehealth: Payer: Self-pay | Admitting: Cardiology

## 2013-06-30 DIAGNOSIS — I10 Essential (primary) hypertension: Secondary | ICD-10-CM

## 2013-06-30 MED ORDER — POTASSIUM CHLORIDE CRYS ER 20 MEQ PO TBCR: 20.0000 meq | EXTENDED_RELEASE_TABLET | Freq: Every day | ORAL | Status: DC

## 2013-06-30 NOTE — Telephone Encounter (Signed)
New message ° ° ° ° °Want lab results °

## 2013-06-30 NOTE — Telephone Encounter (Signed)
Spoke with pt and reviewed BMP results and instructions from Dr. Radford Pax to start [place] 20 meq daily. Pt is aware to come in for lab work next Friday.  Will send prescription to [facility] on [address]

## 2013-07-07 ENCOUNTER — Other Ambulatory Visit (INDEPENDENT_AMBULATORY_CARE_PROVIDER_SITE_OTHER): Payer: Managed Care, Other (non HMO)

## 2013-07-07 DIAGNOSIS — I10 Essential (primary) hypertension: Secondary | ICD-10-CM

## 2013-07-07 LAB — BASIC METABOLIC PANEL
BUN: 13 mg/dL (ref 6–23)
CO2: 29 mEq/L (ref 19–32)
Calcium: 9.3 mg/dL (ref 8.4–10.5)
Chloride: 103 mEq/L (ref 96–112)
Creatinine, Ser: 0.5 mg/dL (ref 0.4–1.2)
GFR: 133.75 mL/min (ref >60.00)
Glucose, Bld: 92 mg/dL (ref 70–99)
POTASSIUM: 3.7 meq/L (ref 3.5–5.1)
Sodium: 139 mEq/L (ref 135–145)

## 2013-07-11 ENCOUNTER — Telehealth: Payer: Self-pay | Admitting: Cardiology

## 2013-07-11 NOTE — Telephone Encounter (Signed)
New message ° ° ° ° ° °Returning a nurses call °

## 2013-07-11 NOTE — Telephone Encounter (Signed)
Patient notified of lab results. Patient verbalized understanding and appreciation for results phone call. She has no questions or concerns at this time.

## 2013-09-16 DIAGNOSIS — S83242A Other tear of medial meniscus, current injury, left knee, initial encounter: Secondary | ICD-10-CM | POA: Insufficient documentation

## 2013-09-16 DIAGNOSIS — M25562 Pain in left knee: Secondary | ICD-10-CM | POA: Insufficient documentation

## 2013-10-05 ENCOUNTER — Other Ambulatory Visit: Payer: Self-pay | Admitting: *Deleted

## 2013-10-05 MED ORDER — POTASSIUM CHLORIDE CRYS ER 20 MEQ PO TBCR: 20.0000 meq | EXTENDED_RELEASE_TABLET | Freq: Every day | ORAL | Status: DC

## 2013-11-08 DIAGNOSIS — Z9889 Other specified postprocedural states: Secondary | ICD-10-CM | POA: Insufficient documentation

## 2013-11-24 ENCOUNTER — Other Ambulatory Visit: Payer: Self-pay

## 2014-01-29 ENCOUNTER — Telehealth: Payer: Self-pay | Admitting: Cardiology

## 2014-01-29 NOTE — Telephone Encounter (Signed)
Pt st that at her OV with Dr. Brigitte Pulse this morning, her cholesterol was high. Per patient's memory, cholesterol was 229, triglycerides were 199, HDL 179, and LDL 139. Instructed patient to have her PCP send a copy of her labs to [facility].  Patient agrees with treatment plan.

## 2014-01-29 NOTE — Telephone Encounter (Signed)
Pt c/o medication issue: 1. Name of Medication: Simvastin 40mg   2. How are you currently taking this medication (dosage and times per day)? 1 every night 3. Are you having a reaction (difficulty breathing--STAT)?  no 4. What is your medication issue? Pt want to know if this medication can be increased by Dr Brigitte Pulse b/c of her cholesterol being high

## 2014-01-29 NOTE — Telephone Encounter (Signed)
Left message to call back  

## 2014-02-16 ENCOUNTER — Telehealth: Payer: Self-pay | Admitting: Cardiology

## 2014-02-16 ENCOUNTER — Encounter: Payer: Self-pay | Admitting: Cardiology

## 2014-02-16 NOTE — Telephone Encounter (Signed)
error 

## 2014-02-16 NOTE — Telephone Encounter (Signed)
New Msg           Pt calling, would like to discuss her cholesterol. Please call.

## 2014-02-16 NOTE — Telephone Encounter (Signed)
Patient inquiring whether we received lab work from her PCP.  Informed patient that they have not been received.

## 2014-02-27 ENCOUNTER — Telehealth: Payer: Self-pay

## 2014-02-27 DIAGNOSIS — E785 Hyperlipidemia, unspecified: Secondary | ICD-10-CM

## 2014-02-27 NOTE — Telephone Encounter (Signed)
-----   Message from Sueanne Margarita, MD sent at 02/19/2014  9:21 PM EST ----- LDL goal <130 and last LDL 139 - please have patient follow a low fat diet and increase exercise and recheck in 3 months

## 2014-02-27 NOTE — Telephone Encounter (Signed)
Patient informed of results and verbal understanding expressed.  Instructed patient to follow low fat diet and increase exercise. Repeat labs scheduled for 4/19. Patient agrees with treatment plan.

## 2014-04-02 ENCOUNTER — Encounter: Payer: Self-pay | Admitting: Cardiology

## 2014-05-29 ENCOUNTER — Other Ambulatory Visit: Payer: Managed Care, Other (non HMO)

## 2014-07-02 ENCOUNTER — Other Ambulatory Visit: Payer: Managed Care, Other (non HMO)

## 2014-07-03 ENCOUNTER — Other Ambulatory Visit (INDEPENDENT_AMBULATORY_CARE_PROVIDER_SITE_OTHER): Payer: Medicare HMO | Admitting: *Deleted

## 2014-07-03 DIAGNOSIS — E785 Hyperlipidemia, unspecified: Secondary | ICD-10-CM | POA: Diagnosis not present

## 2014-07-03 LAB — LIPID PANEL
CHOL/HDL RATIO: 4
Cholesterol: 199 mg/dL (ref 0–200)
HDL: 44.5 mg/dL (ref >39.00)
NonHDL: 154.5
TRIGLYCERIDES: 226 mg/dL — AB (ref 0.0–149.0)
VLDL: 45.2 mg/dL — ABNORMAL HIGH (ref 0.0–40.0)

## 2014-07-03 LAB — ALT: ALT: 8 U/L (ref 0–35)

## 2014-07-03 LAB — LDL CHOLESTEROL, DIRECT: Direct LDL: 114 mg/dL

## 2014-07-03 NOTE — Addendum Note (Signed)
Addended by: Eulis Foster on: 07/03/2014 10:46 AM   Modules accepted: Orders

## 2014-07-05 ENCOUNTER — Encounter: Payer: Self-pay | Admitting: Cardiology

## 2014-07-05 ENCOUNTER — Ambulatory Visit (INDEPENDENT_AMBULATORY_CARE_PROVIDER_SITE_OTHER): Payer: Medicare HMO | Admitting: Cardiology

## 2014-07-05 VITALS — BP 132/90 | HR 71 | Ht 63.0 in | Wt 157.8 lb

## 2014-07-05 DIAGNOSIS — I201 Angina pectoris with documented spasm: Secondary | ICD-10-CM

## 2014-07-05 DIAGNOSIS — I4510 Unspecified right bundle-branch block: Secondary | ICD-10-CM

## 2014-07-05 DIAGNOSIS — I10 Essential (primary) hypertension: Secondary | ICD-10-CM

## 2014-07-05 DIAGNOSIS — E785 Hyperlipidemia, unspecified: Secondary | ICD-10-CM

## 2014-07-05 LAB — BASIC METABOLIC PANEL
BUN: 17 mg/dL (ref 6–23)
CO2: 30 mEq/L (ref 19–32)
Calcium: 9.3 mg/dL (ref 8.4–10.5)
Chloride: 102 mEq/L (ref 96–112)
Creatinine, Ser: 0.47 mg/dL (ref 0.40–1.20)
GFR: 139.92 mL/min (ref >60.00)
Glucose, Bld: 103 mg/dL — ABNORMAL HIGH (ref 70–99)
Potassium: 3.4 mEq/L — ABNORMAL LOW (ref 3.5–5.1)
Sodium: 139 mEq/L (ref 135–145)

## 2014-07-05 NOTE — Patient Instructions (Signed)
Medication Instructions:  Your physician recommends that you continue on your current medications as directed. Please refer to the Current Medication list given to you today.   Labwork: TODAY: BMET  IN 4 MONTHS: Fasting appointment for LFTs, Lipids  Testing/Procedures: None  Follow-Up: Your physician wants you to follow-up in: 6 months with Dr. Radford Pax. You will receive a reminder letter in the mail two months in advance. If you don't receive a letter, please call our office to schedule the follow-up appointment.   Any Other Special Instructions Will Be Listed Below (If Applicable).

## 2014-07-05 NOTE — Progress Notes (Signed)
Cardiology Office Note   Date:  07/05/2014   ID:  Vanessa Stewart, DOB [birth year], MRN [id number]  PCP:  Mayra Neer, MD    Chief Complaint  Patient presents with  . Follow-up    RBBB      History of Present Illness: Vanessa Stewart is a 69 y.o. female with a history of prinzmetal's angina, HTN and RBBB who presents today for followup. She is doing well. She denies any chest pain, SOB, DOE, dizziness, palpitations or syncope. She walks at the mall for exercise and uses the elliptical at the gym.   She occasionally has some LE edema if she stands for long periods of time or going on trips.    Past Medical History  Diagnosis Date  . Allergy   . Hypothyroidism   . Essential tremor   . Osteopenia   . Depression   . Hypertension   . Hyperlipidemia   . RBBB     Chronic  . Prinzmetal's angina     normal coronary arteries with coronary artery vasospasm at time of cath    Past Surgical History  Procedure Laterality Date  . Cardiac catheterization  12/09    coronary spasm noted on heart cath. No coronary artery disease. LV function normal  . Total abdominal hysterectomy w/ bilateral salpingoophorectomy    . Thyroidectomy    . Colonoscopy  07/07    ganem- 10 Years     Current Outpatient Prescriptions  Medication Sig Dispense Refill  . amoxicillin (AMOXIL) 875 MG tablet Take 1 tablet (875 mg total) by mouth 2 (two) times daily. 20 tablet 0  . aspirin 81 MG tablet Take 81 mg by mouth daily.    [name] diltiazem (CARDIZEM CD) 180 MG 24 hr capsule Take 180 mg by mouth daily.    . folic acid (FOLVITE) 1 MG tablet Take 1 mg by mouth daily.    [name] levothyroxine (SYNTHROID, LEVOTHROID) 50 MCG tablet Take 50 mcg by mouth daily.    [name] lisinopril-hydrochlorothiazide (PRINZIDE,ZESTORETIC) 20-25 MG per tablet Take 1 tablet by mouth daily.    . Omega-3 Fatty Acids (FISH OIL) 1000 MG CAPS Take 1 capsule by mouth daily.    [name] PARoxetine (PAXIL) 20 MG tablet Take 20 mg by mouth daily.     . potassium chloride SA (K-DUR,KLOR-CON) 20 MEQ tablet Take 1 tablet (20 mEq total) by mouth daily. 90 tablet 1  . simvastatin (ZOCOR) 40 MG tablet Take 40 mg by mouth daily.    [name] thiamine (VITAMIN B-1) 100 MG tablet Take 100 mg by mouth daily.    [name] thyroid (ARMOUR) 60 MG tablet Take 60 mg by mouth daily before breakfast.     No current facility-administered medications for this visit.    Allergies:   Sulfa antibiotics; Septra; and Sulfur    Social History:  The patient  reports that she has never smoked. She does not have any smokeless tobacco history on file. She reports that she does not drink alcohol or use illicit drugs.   Family History:  The patient's family history includes Heart disease in her brother, brother, mother, and sister; Stroke in her father.    ROS:  Please see the history of present illness.   Otherwise, review of systems are positive for none.   All other systems are reviewed and negative.    PHYSICAL EXAM: VS:  BP 132/90 mmHg  Pulse 71  Ht 5\' 3"  (1.6 m)  Wt 157 lb 12.8 oz (71.578 kg)  BMI 27.96 kg/m2 , BMI Body mass index is 27.96 kg/(m^2). GEN: Well nourished, well developed, in no acute distress HEENT: normal Neck: no JVD, carotid bruits, or masses Cardiac: RRR; no murmurs, rubs, or gallops,no edema  Respiratory:  clear to auscultation bilaterally, normal work of breathing GI: soft, nontender, nondistended, + BS MS: no deformity or atrophy Skin: warm and dry, no rash Neuro:  Strength and sensation are intact Psych: euthymic mood, full affect   EKG:  EKG was ordered today and showed NSR with RBBB    Recent Labs: 07/07/2013: BUN 13; Creatinine 0.5; Potassium 3.7; Sodium 139 07/03/2014: ALT 8    Lipid Panel    Component Value Date/Time   CHOL 199 07/03/2014 1046   TRIG 226.0* 07/03/2014 1046   HDL 44.50 07/03/2014 1046   CHOLHDL 4 07/03/2014 1046   VLDL 45.2* 07/03/2014 1046   LDLCALC 125* 06/06/2013 0752   LDLDIRECT 114.0 07/03/2014 1046        Wt Readings from Last 3 Encounters:  07/05/14 157 lb 12.8 oz (71.578 kg)  06/29/13 159 lb (72.122 kg)  05/16/13 162 lb (73.483 kg)    ASSESSMENT AND PLAN: 1. Prinzmetal's angina - with no angina - continue Cardizem 2. HTN borderline controlled - at home it runs 130/74mmHg - continue Prinizide/Diltizazem - check BMET 3. RBBB 4. Dyslipidemia with LDL goal <130 with no history of CAD. Her last LDL was 114, HDL 45 and TAG 226.  She has been eating a lot of carbs recently and also had knee surgery and has been sedentary.  She is going to watch her diet and get back into swimming - continue Zocor - recheck FLP and ALT in 4 months    Current medicines are reviewed at length with the patient today.  The patient does not have concerns regarding medicines.  The following changes have been made:  no change  Labs/ tests ordered today include: see above assessment and plan  Orders Placed This Encounter  Procedures  . Basic Metabolic Panel (BMET)  . Hepatic function panel  . Lipid Profile  . EKG 12-Lead     Disposition:   FU with me in 1 year   Signed, Sueanne Margarita, MD  07/05/2014 11:30 AM    [facility] [address], [place], [place]  [address] Phone: [phone]; Fax: [phone]

## 2014-07-06 ENCOUNTER — Telehealth: Payer: Self-pay | Admitting: Cardiology

## 2014-07-06 DIAGNOSIS — I10 Essential (primary) hypertension: Secondary | ICD-10-CM

## 2014-07-06 MED ORDER — POTASSIUM CHLORIDE CRYS ER 20 MEQ PO TBCR: 20.0000 meq | EXTENDED_RELEASE_TABLET | Freq: Two times a day (BID) | ORAL | Status: DC

## 2014-07-06 NOTE — Telephone Encounter (Signed)
New problem ° ° °Pt returning your call. °

## 2014-07-06 NOTE — Telephone Encounter (Signed)
-----   Message from Sueanne Margarita, MD sent at 07/05/2014  2:59 PM EDT ----- Increase Kdur to 44meq 1 tablet BID and recheck BMET in 1 week

## 2014-07-06 NOTE — Telephone Encounter (Signed)
Informed patient of results and verbal understanding expressed.  Instructed patient to INCREASE KDUR to 20 meq BID. Repeat BMET scheduled for next Friday.  Patient agrees with treatment plan.

## 2014-07-13 ENCOUNTER — Other Ambulatory Visit (INDEPENDENT_AMBULATORY_CARE_PROVIDER_SITE_OTHER): Payer: Medicare HMO | Admitting: *Deleted

## 2014-07-13 DIAGNOSIS — I10 Essential (primary) hypertension: Secondary | ICD-10-CM

## 2014-07-13 LAB — BASIC METABOLIC PANEL
BUN: 13 mg/dL (ref 6–23)
CO2: 26 mEq/L (ref 19–32)
Calcium: 9.4 mg/dL (ref 8.4–10.5)
Chloride: 104 mEq/L (ref 96–112)
Creatinine, Ser: 0.5 mg/dL (ref 0.40–1.20)
GFR: 130.27 mL/min (ref >60.00)
Glucose, Bld: 125 mg/dL — ABNORMAL HIGH (ref 70–99)
Potassium: 3.8 mEq/L (ref 3.5–5.1)
Sodium: 137 mEq/L (ref 135–145)

## 2014-07-13 NOTE — Addendum Note (Signed)
Addended by: Eulis Foster on: 07/13/2014 08:29 AM   Modules accepted: Orders

## 2014-07-16 ENCOUNTER — Telehealth: Payer: Self-pay | Admitting: Cardiology

## 2014-07-16 DIAGNOSIS — E785 Hyperlipidemia, unspecified: Secondary | ICD-10-CM

## 2014-07-16 NOTE — Telephone Encounter (Signed)
New message      Question about cholesterol

## 2014-07-16 NOTE — Telephone Encounter (Signed)
In EPIC it is listed the patient is taking 40 mg, but she is not.  Patient is currently taking Simvastatin 10 mg and wants to know if Dr. Radford Pax wants her to take 20 mg. Informed the patient that as of a couple weeks ago when we spoke about her lab results, her triglycerides were high and she needed to watch her fats and her carbs but no medication changes were made. Instructed her to continue 10 mg since that is what she has been taking and her labs were fine.

## 2014-07-17 NOTE — Telephone Encounter (Signed)
Agree and repeat lipids in 4 months

## 2014-07-18 MED ORDER — SIMVASTATIN 10 MG PO TABS: 10.0000 mg | ORAL_TABLET | Freq: Every day | ORAL | Status: DC

## 2014-07-18 NOTE — Telephone Encounter (Signed)
Instructed patient to continue Simvastatin 10 mg daily. Patient requests recall letter for fasting lab work in 4 months. Labs ordered for scheduling and refill sent.

## 2014-08-06 ENCOUNTER — Other Ambulatory Visit: Payer: Self-pay

## 2014-10-30 ENCOUNTER — Telehealth: Payer: Self-pay

## 2014-10-30 NOTE — Telephone Encounter (Signed)
Any suggestions on a cheaper option?

## 2014-10-31 NOTE — Telephone Encounter (Signed)
Spoke with pt.  Her K was only 3.8 in June and she is taking 40 mEq per day.  There is no OTC version available that will be able to match that amount of potassium.  I did check [facility] website and it was ~$30 per month for 60 tablets.  Suggested pt check with other pharmacies to see which has the best price and we can send in a new Rx if she would like.

## 2014-12-07 DIAGNOSIS — R350 Frequency of micturition: Secondary | ICD-10-CM | POA: Diagnosis not present

## 2014-12-07 DIAGNOSIS — M545 Low back pain: Secondary | ICD-10-CM | POA: Diagnosis not present

## 2014-12-07 DIAGNOSIS — N3000 Acute cystitis without hematuria: Secondary | ICD-10-CM | POA: Diagnosis not present

## 2015-01-30 DIAGNOSIS — Z0100 Encounter for examination of eyes and vision without abnormal findings: Secondary | ICD-10-CM | POA: Diagnosis not present

## 2015-03-07 DIAGNOSIS — I10 Essential (primary) hypertension: Secondary | ICD-10-CM | POA: Diagnosis not present

## 2015-03-07 DIAGNOSIS — E039 Hypothyroidism, unspecified: Secondary | ICD-10-CM | POA: Diagnosis not present

## 2015-03-07 DIAGNOSIS — I201 Angina pectoris with documented spasm: Secondary | ICD-10-CM | POA: Diagnosis not present

## 2015-03-07 DIAGNOSIS — R69 Illness, unspecified: Secondary | ICD-10-CM | POA: Diagnosis not present

## 2015-03-07 DIAGNOSIS — E7800 Pure hypercholesterolemia, unspecified: Secondary | ICD-10-CM | POA: Diagnosis not present

## 2015-03-07 DIAGNOSIS — Z0000 Encounter for general adult medical examination without abnormal findings: Secondary | ICD-10-CM | POA: Diagnosis not present

## 2015-03-07 DIAGNOSIS — Z1211 Encounter for screening for malignant neoplasm of colon: Secondary | ICD-10-CM | POA: Diagnosis not present

## 2015-03-07 DIAGNOSIS — M810 Age-related osteoporosis without current pathological fracture: Secondary | ICD-10-CM | POA: Diagnosis not present

## 2015-03-11 ENCOUNTER — Encounter: Payer: Self-pay | Admitting: Cardiology

## 2015-03-13 ENCOUNTER — Telehealth: Payer: Self-pay

## 2015-03-13 NOTE — Telephone Encounter (Signed)
From these results, PCP increased simvastatin to 20 mg daily. She has been taking 20 mg daily for 4 days.  FU labs are scheduled for early April. She understands to have PCP forward lab results when they are complete.  Med list updated.

## 2015-03-13 NOTE — Telephone Encounter (Signed)
-----   Message from Sueanne Margarita, MD sent at 03/12/2015  9:40 AM EST ----- LDL from PCP labs too high - please verify what dose of Simvastatin patient is on

## 2015-04-23 DIAGNOSIS — Z0100 Encounter for examination of eyes and vision without abnormal findings: Secondary | ICD-10-CM | POA: Diagnosis not present

## 2015-05-17 ENCOUNTER — Ambulatory Visit (INDEPENDENT_AMBULATORY_CARE_PROVIDER_SITE_OTHER): Payer: Medicare HMO | Admitting: Emergency Medicine

## 2015-05-17 ENCOUNTER — Ambulatory Visit: Payer: Self-pay

## 2015-05-17 ENCOUNTER — Ambulatory Visit (INDEPENDENT_AMBULATORY_CARE_PROVIDER_SITE_OTHER): Payer: Medicare HMO

## 2015-05-17 VITALS — BP 142/90 | HR 63 | Temp 97.5°F | Resp 16 | Ht 62.0 in | Wt 155.4 lb

## 2015-05-17 DIAGNOSIS — R0781 Pleurodynia: Secondary | ICD-10-CM | POA: Diagnosis not present

## 2015-05-17 DIAGNOSIS — S299XXA Unspecified injury of thorax, initial encounter: Secondary | ICD-10-CM | POA: Diagnosis not present

## 2015-05-17 DIAGNOSIS — S20212A Contusion of left front wall of thorax, initial encounter: Secondary | ICD-10-CM | POA: Diagnosis not present

## 2015-05-17 MED ORDER — HYDROCODONE-ACETAMINOPHEN 5-325 MG PO TABS: ORAL_TABLET | ORAL | Status: DC

## 2015-05-17 NOTE — Patient Instructions (Addendum)
IF you received an x-ray today, you will receive an invoice from [facility]. Please contact [facility] at [phone] with questions or concerns regarding your invoice.   IF you received labwork today, you will receive an invoice from [employer]. Please contact [facility] at [phone] with questions or concerns regarding your invoice.   Our billing staff will not be able to assist you with questions regarding bills from these companies.  You will be contacted with the lab results as soon as they are available. The fastest way to get your results is to activate your My Chart account. Instructions are located on the last page of this paperwork. If you have not heard from [place] regarding the results in 2 weeks, please contact this office.     Rib Contusion A rib contusion is a deep bruise on your rib area. Contusions are the result of a blunt trauma that causes bleeding and injury to the tissues under the skin. A rib contusion may involve bruising of the ribs and of the skin and muscles in the area. The skin overlying the contusion may turn blue, purple, or yellow. Minor injuries will give you a painless contusion, but more severe contusions may stay painful and swollen for a few weeks. CAUSES  A contusion is usually caused by a blow, trauma, or direct force to an area of the body. This often occurs while playing contact sports. SYMPTOMS  Swelling and redness of the injured area.  Discoloration of the injured area.  Tenderness and soreness of the injured area.  Pain with or without movement. DIAGNOSIS  The diagnosis can be made by taking a medical history and performing a physical exam. An X-ray, CT scan, or MRI may be needed to determine if there were any associated injuries, such as broken bones (fractures) or internal injuries. TREATMENT  Often, the best treatment for a rib contusion is rest. Icing or applying cold compresses to the  injured area may help reduce swelling and inflammation. Deep breathing exercises may be recommended to reduce the risk of partial lung collapse and pneumonia. Over-the-counter or prescription medicines may also be recommended for pain control. HOME CARE INSTRUCTIONS   Apply ice to the injured area:  Put ice in a plastic bag.  Place a towel between your skin and the bag.  Leave the ice on for 20 minutes, 2-3 times per day.  Take medicines only as directed by your health care provider.  Rest the injured area. Avoid strenuous activity and any activities or movements that cause pain. Be careful during activities and avoid bumping the injured area.  Perform deep-breathing exercises as directed by your health care provider.  Do not lift anything that is heavier than 5 lb (2.3 kg) until your health care provider approves.  Do not use any tobacco products, including cigarettes, chewing tobacco, or electronic cigarettes. If you need help quitting, ask your health care provider. SEEK MEDICAL CARE IF:   You have increased bruising or swelling.  You have pain that is not controlled with treatment.  You have a fever. SEEK IMMEDIATE MEDICAL CARE IF:   You have difficulty breathing or shortness of breath.  You develop a continual cough, or you cough up thick or bloody sputum.  You feel sick to your stomach (nauseous), you throw up (vomit), or you have abdominal pain.   This information is not intended to replace advice given to you by your health care provider. Make sure you discuss any  questions you have with your health care provider.   Document Released: 10/21/2000 Document Revised: 02/16/2014 Document Reviewed: 11/07/2013 Elsevier Interactive Patient Education [employer].
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
## 2015-05-25 ENCOUNTER — Other Ambulatory Visit: Payer: Self-pay | Admitting: Cardiology

## 2015-06-05 DIAGNOSIS — E782 Mixed hyperlipidemia: Secondary | ICD-10-CM | POA: Diagnosis not present

## 2015-06-10 ENCOUNTER — Encounter: Payer: Self-pay | Admitting: Cardiology

## 2015-07-03 ENCOUNTER — Ambulatory Visit (INDEPENDENT_AMBULATORY_CARE_PROVIDER_SITE_OTHER): Payer: Medicare HMO | Admitting: Cardiology

## 2015-07-03 ENCOUNTER — Encounter: Payer: Self-pay | Admitting: Cardiology

## 2015-07-03 VITALS — BP 140/96 | HR 73 | Ht 62.0 in | Wt 153.4 lb

## 2015-07-03 DIAGNOSIS — I10 Essential (primary) hypertension: Secondary | ICD-10-CM

## 2015-07-03 DIAGNOSIS — I201 Angina pectoris with documented spasm: Secondary | ICD-10-CM | POA: Diagnosis not present

## 2015-07-03 DIAGNOSIS — I4510 Unspecified right bundle-branch block: Secondary | ICD-10-CM | POA: Diagnosis not present

## 2015-07-03 DIAGNOSIS — I6529 Occlusion and stenosis of unspecified carotid artery: Secondary | ICD-10-CM | POA: Diagnosis not present

## 2015-07-03 HISTORY — DX: Occlusion and stenosis of unspecified carotid artery: I65.29

## 2015-07-03 MED ORDER — ATORVASTATIN CALCIUM 20 MG PO TABS: 20.0000 mg | ORAL_TABLET | Freq: Every day | ORAL | Status: DC

## 2015-07-03 MED ORDER — POTASSIUM CHLORIDE CRYS ER 20 MEQ PO TBCR: 20.0000 meq | EXTENDED_RELEASE_TABLET | Freq: Two times a day (BID) | ORAL | Status: DC

## 2015-07-03 NOTE — Progress Notes (Signed)
Cardiology Office Note    Date:  07/03/2015   ID:  Vanessa Stewart, DOB 1945-07-27, MRN [id number]  PCP:  Mayra Neer, MD  Cardiologist:  Fransico Him, MD   Chief Complaint  Patient presents with  . Chest Pain  . Hypertension    History of Present Illness:  Vanessa Stewart is a 70 y.o. female with a history of prinzmetal's angina, HTN and RBBB who presents today for followup. She is doing well. She denies any chest pain, SOB, DOE, dizziness, LE edema, palpitations or syncope. She fractured some ribs after a fall and was not exercising but is starting back at the gym. She had a life screening done a few years ago which showed some minimal plaque in her carotid arteries.  Her LDL was up at 127 recently.    Past Medical History  Diagnosis Date  . Allergy   . Hypothyroidism   . Essential tremor   . Osteopenia   . Depression   . Hypertension   . Hyperlipidemia   . RBBB     Chronic  . Prinzmetal's angina (HCC)     normal coronary arteries with coronary artery vasospasm at time of cath  . Carotid artery stenosis 07/03/2015    Past Surgical History  Procedure Laterality Date  . Cardiac catheterization  12/09    coronary spasm noted on heart cath. No coronary artery disease. LV function normal  . Total abdominal hysterectomy w/ bilateral salpingoophorectomy    . Thyroidectomy    . Colonoscopy  07/07    ganem- 10 Years  . Knee surgery      Current Medications: Outpatient Prescriptions Prior to Visit  Medication Sig Dispense Refill  . aspirin 81 MG tablet Take 81 mg by mouth daily.    [name] diltiazem (CARDIZEM CD) 180 MG 24 hr capsule Take 180 mg by mouth daily.    . folic acid (FOLVITE) 1 MG tablet Take 1 mg by mouth daily.    [name] HYDROcodone-acetaminophen (NORCO) 5-325 MG tablet One half tablet every 6 hours as needed for severe pain 20 tablet 0  . KLOR-CON M20 20 MEQ tablet TAKE ONE TABLET BY MOUTH TWICE DAILY 60 tablet 0  . lisinopril-hydrochlorothiazide  (PRINZIDE,ZESTORETIC) 20-25 MG per tablet Take 1 tablet by mouth daily.    . Omega-3 Fatty Acids (FISH OIL) 1000 MG CAPS Take 1 capsule by mouth daily.    [name] PARoxetine (PAXIL) 20 MG tablet Take 20 mg by mouth daily.    [name] thiamine (VITAMIN B-1) 100 MG tablet Take 100 mg by mouth daily.    . simvastatin (ZOCOR) 20 MG tablet Take 20 mg by mouth daily.    [name] thyroid (ARMOUR) 60 MG tablet Take 60 mg by mouth daily before breakfast.     No facility-administered medications prior to visit.     Allergies:   Sulfa antibiotics; Septra; and Sulfur   Social History   Social History  . Marital Status: Widowed    Spouse Name: N/A  . Number of Children: N/A  . Years of Education: N/A   Social History Main Topics  . Smoking status: Never Smoker   . Smokeless tobacco: Never Used  . Alcohol Use: No  . Drug Use: No  . Sexual Activity: Not Asked   Other Topics Concern  . None   Social History Narrative     Family History:  The patient's family history includes Heart disease in her brother, brother, mother, and sister; Stroke in her father.  ROS:   Please see the history of present illness.    ROS All other systems reviewed and are negative.   PHYSICAL EXAM:   VS:  BP 140/96 mmHg  Pulse 73  Ht 5\' 2"  (1.575 m)  Wt 153 lb 6.4 oz (69.582 kg)  BMI 28.05 kg/m2   GEN: Well nourished, well developed, in no acute distress HEENT: normal Neck: no JVD, carotid bruits, or masses Cardiac: RRR; no murmurs, rubs, or gallops,no edema.  Intact distal pulses bilaterally.  Respiratory:  clear to auscultation bilaterally, normal work of breathing GI: soft, nontender, nondistended, + BS MS: no deformity or atrophy Skin: warm and dry, no rash Neuro:  Alert and Oriented x 3, Strength and sensation are intact Psych: euthymic mood, full affect  Wt Readings from Last 3 Encounters:  07/03/15 153 lb 6.4 oz (69.582 kg)  05/17/15 155 lb 6.4 oz (70.489 kg)  07/05/14 157 lb 12.8 oz (71.578 kg)       Studies/Labs Reviewed:   EKG:  EKG is  ordered today and showed NSR at 73bpm with RBBB.  Recent Labs: 07/03/2014: ALT 8 07/13/2014: BUN 13; Creatinine, Ser 0.50; Potassium 3.8; Sodium 137   Lipid Panel    Component Value Date/Time   CHOL 199 07/03/2014 1046   TRIG 226.0* 07/03/2014 1046   HDL 44.50 07/03/2014 1046   CHOLHDL 4 07/03/2014 1046   VLDL 45.2* 07/03/2014 1046   LDLCALC 125* 06/06/2013 0752   LDLDIRECT 114.0 07/03/2014 1046    Additional studies/ records that were reviewed today include:  none    ASSESSMENT:    1. Prinzmetal angina ([place])   2. Essential hypertension, benign   3. RBBB   4. Carotid artery stenosis, unspecified laterality      PLAN:  In order of problems listed above:  1. Prinzmetal angina - she has not had any further angina.  Continue CCB 2. HTN - BP borderline controlled today but at home 126/75mmHg. Continue CCB and Prinizide 3. RBBB 4. Carotid artery stenosis by life screening - I will get carotid dopplers to assess further.  If this documents carotid disease then will need LDL < 70.  She is on simvastatin and dose was increased in March.  I have discussed with her that she should not be on simvastatin in conjunction with CCB.  I have told her to stop the Simvastatin and change to Lipitor 20mg  daily and recheck FLP and ALT in 6 weeks.     Medication Adjustments/Labs and Tests Ordered: Current medicines are reviewed at length with the patient today.  Concerns regarding medicines are outlined above.  Medication changes, Labs and Tests ordered today are listed in the Patient Instructions below.  There are no Patient Instructions on file for this visit.   Signed, Fransico Him, MD  07/03/2015 9:40 AM    Vanessa Stewart, [place], [place]  [id number] Phone: [phone]; Fax: [phone]

## 2015-07-03 NOTE — Patient Instructions (Signed)
Medication Instructions:  1) STOP SIMVASTATIN 2) START LIPITOR 20 mg daily  Labwork: Your physician recommends that you return for FASTING lab work in [place].  Testing/Procedures: Your physician has requested that you have a carotid duplex. This test is an ultrasound of the carotid arteries in your neck. It looks at blood flow through these arteries that supply the brain with blood. Allow one hour for this exam. There are no restrictions or special instructions.  Follow-Up: Your physician wants you to follow-up in: 6 months with Dr. Radford Pax. You will receive a reminder letter in the mail two months in advance. If you don't receive a letter, please call our office to schedule the follow-up appointment.   Any Other Special Instructions Will Be Listed Below (If Applicable).     If you need a refill on your cardiac medications before your next appointment, please call your pharmacy.

## 2015-07-11 ENCOUNTER — Telehealth: Payer: Self-pay | Admitting: Cardiology

## 2015-07-11 ENCOUNTER — Inpatient Hospital Stay (HOSPITAL_COMMUNITY): Admission: RE | Admit: 2015-07-11 | Payer: Medicare HMO | Source: Ambulatory Visit

## 2015-07-11 NOTE — Telephone Encounter (Signed)
If price is a concern, may try pravastatin 80mg  daily.  If her carotid dopplers show evidence of disease, this may not be potent enough to get her to goal of < 70 mg/dL.

## 2015-07-11 NOTE — Telephone Encounter (Signed)
Patient called to report she tried taking Lipitor prescribed at last OV 5/24 but simply cannot take it. She is "sore all over." She looked into Crestor prices and it is too expensive.  She is requesting new recommendations from Dr. Radford Pax for medications.  To Dr. Radford Pax and the [facility].

## 2015-07-11 NOTE — Telephone Encounter (Signed)
New Message  Pt calling to speak w/ RN about pt's lipitor. Please call back and discuss.

## 2015-07-11 NOTE — Telephone Encounter (Signed)
Left message to call back  

## 2015-07-12 MED ORDER — PRAVASTATIN SODIUM 80 MG PO TABS: 80.0000 mg | ORAL_TABLET | Freq: Every evening | ORAL | Status: DC

## 2015-07-12 NOTE — Telephone Encounter (Signed)
F/u    Patient returning RN call from 6/1

## 2015-07-12 NOTE — Telephone Encounter (Signed)
Patient agrees to try Pravastatin. Instructed patient to START PRAVASTATIN 80 mg daily.  Rescheduled fasting lab work to 8/4. Patient agrees with treatment plan.

## 2015-07-15 ENCOUNTER — Telehealth: Payer: Self-pay | Admitting: Cardiology

## 2015-07-15 NOTE — Telephone Encounter (Signed)
Patient called to report her intolerance to Pravastatin. She just started taking last week and she wakes up in the morning with extremely sore, weak legs and hands.  She st the only statin she was able to take was Simvastatin, but due to other medications she could not increase the dosage.  She is intolerant to Lipitor and Pravastatin and cannot afford Crestor to try.  To [facility] Clinic for recommendations.

## 2015-07-15 NOTE — Telephone Encounter (Signed)
Pt calling re Cholesterol med not working -pls call [phone]

## 2015-07-17 NOTE — Telephone Encounter (Signed)
Max dose of simvastatin with diltiazem is 10mg  daily.  Pt on dilt for Prinzmetal angina.  Verapamil has the same dosing limits so not able to adjust her CCB to avoid interaction. She is primary prevention (has carotid ultrasound scheduled to ensure no CAD) and simvastatin 10mg  resulted in LDL of 140 mg/dL.  Best option may be to continue low dose simvastatin given her intolerances and drug interactions.

## 2015-07-18 NOTE — Telephone Encounter (Signed)
Patient st she tried Pravastatin again and does not have any aching. She has even gone swimming and has had no discomfort in her arms or legs. She st she will continue to take Pravastatin and will call if side effects occur. Patient was grateful for call.

## 2015-07-18 NOTE — Telephone Encounter (Signed)
Agree with Elberta Leatherwood PharmD

## 2015-07-23 ENCOUNTER — Telehealth: Payer: Self-pay | Admitting: Cardiology

## 2015-07-23 MED ORDER — SIMVASTATIN 10 MG PO TABS: 10.0000 mg | ORAL_TABLET | Freq: Every day | ORAL | Status: DC

## 2015-07-23 NOTE — Telephone Encounter (Signed)
Vanessa Stewart, RPH at 07/17/2015 2:48 PM     Status: Signed       Expand All Collapse All   Max dose of simvastatin with diltiazem is 10mg  daily. Pt on dilt for Prinzmetal angina. Verapamil has the same dosing limits so not able to adjust her CCB to avoid interaction. She is primary prevention (has carotid ultrasound scheduled to ensure no CAD) and simvastatin 10mg  resulted in LDL of 140 mg/dL. Best option may be to continue low dose simvastatin given her intolerances and drug interactions.         Patient called to report her legs are weak and aching and she can no longer tolerate her Pravastatin. Per note last week, instructed patient to STOP PRAVASTATIN and START SIMVASTATIN 10 mg daily. She will keep follow-up lab appointment in August. Patient was grateful for call back.

## 2015-07-23 NOTE — Telephone Encounter (Signed)
New Message  Pt requested to speak /w RN about her cholesterol medication. Please call back and discuss.

## 2015-07-26 DIAGNOSIS — Z1231 Encounter for screening mammogram for malignant neoplasm of breast: Secondary | ICD-10-CM | POA: Diagnosis not present

## 2015-07-30 ENCOUNTER — Telehealth: Payer: Self-pay

## 2015-07-30 ENCOUNTER — Encounter: Payer: Self-pay | Admitting: Cardiology

## 2015-07-30 ENCOUNTER — Ambulatory Visit (HOSPITAL_COMMUNITY)
Admission: RE | Admit: 2015-07-30 | Discharge: 2015-07-30 | Disposition: A | Discharge status: Home / Self Care | Payer: Medicare HMO | Source: Ambulatory Visit | Attending: Cardiology | Admitting: Cardiology

## 2015-07-30 DIAGNOSIS — F329 Major depressive disorder, single episode, unspecified: Secondary | ICD-10-CM | POA: Diagnosis not present

## 2015-07-30 DIAGNOSIS — R69 Illness, unspecified: Secondary | ICD-10-CM | POA: Diagnosis not present

## 2015-07-30 DIAGNOSIS — E785 Hyperlipidemia, unspecified: Secondary | ICD-10-CM | POA: Diagnosis not present

## 2015-07-30 DIAGNOSIS — I6523 Occlusion and stenosis of bilateral carotid arteries: Secondary | ICD-10-CM | POA: Insufficient documentation

## 2015-07-30 DIAGNOSIS — I10 Essential (primary) hypertension: Secondary | ICD-10-CM | POA: Insufficient documentation

## 2015-07-30 DIAGNOSIS — I6529 Occlusion and stenosis of unspecified carotid artery: Secondary | ICD-10-CM

## 2015-07-30 NOTE — Telephone Encounter (Signed)
-----   Message from Sueanne Margarita, MD sent at 07/30/2015  3:33 PM EDT ----- 1-39% bilateral carotid artery stenosis - repeat dopplers in 2 years

## 2015-07-30 NOTE — Telephone Encounter (Signed)
Informed patient of results and verbal understanding expressed.   Repeat carotids ordered to be scheduled in 2 years. Patient agrees with treatment plan. 

## 2015-08-01 DIAGNOSIS — R200 Anesthesia of skin: Secondary | ICD-10-CM | POA: Diagnosis not present

## 2015-08-14 ENCOUNTER — Other Ambulatory Visit: Payer: Medicare HMO

## 2015-08-20 DIAGNOSIS — H2511 Age-related nuclear cataract, right eye: Secondary | ICD-10-CM | POA: Diagnosis not present

## 2015-08-20 DIAGNOSIS — H25011 Cortical age-related cataract, right eye: Secondary | ICD-10-CM | POA: Diagnosis not present

## 2015-08-20 DIAGNOSIS — H25012 Cortical age-related cataract, left eye: Secondary | ICD-10-CM | POA: Diagnosis not present

## 2015-08-20 DIAGNOSIS — H2512 Age-related nuclear cataract, left eye: Secondary | ICD-10-CM | POA: Diagnosis not present

## 2015-09-12 ENCOUNTER — Telehealth: Payer: Self-pay | Admitting: Cardiology

## 2015-09-12 NOTE — Telephone Encounter (Signed)
Informed patient lab work is being drawn to check her cholesterol and liver function after starting Zocor. Explained to her that results will be forwarded to PCP, so if she draws labs next week (a concern was double charging for tests), she shouldn't have to have the same lab work drawn again unless there are abnormalities. She was grateful for call.

## 2015-09-12 NOTE — Telephone Encounter (Signed)
New message     Pt has a appt tomorrow for lab work and wants to know can she wait and have the lab work done with her reg doc in 6 months. She wants to know if insurance will pay for both lab work appt. Please call.

## 2015-09-13 ENCOUNTER — Other Ambulatory Visit: Payer: Medicare HMO | Admitting: *Deleted

## 2015-09-13 DIAGNOSIS — I6529 Occlusion and stenosis of unspecified carotid artery: Secondary | ICD-10-CM

## 2015-09-13 DIAGNOSIS — I4510 Unspecified right bundle-branch block: Secondary | ICD-10-CM

## 2015-09-13 DIAGNOSIS — I10 Essential (primary) hypertension: Secondary | ICD-10-CM | POA: Diagnosis not present

## 2015-09-13 DIAGNOSIS — I201 Angina pectoris with documented spasm: Secondary | ICD-10-CM | POA: Diagnosis not present

## 2015-09-13 LAB — HEPATIC FUNCTION PANEL
ALK PHOS: 76 U/L (ref 33–130)
ALT: 6 U/L (ref 6–29)
AST: 13 U/L (ref 10–35)
Albumin: 4.1 g/dL (ref 3.6–5.1)
BILIRUBIN DIRECT: 0.1 mg/dL (ref <=0.2)
BILIRUBIN INDIRECT: 0.4 mg/dL (ref 0.2–1.2)
BILIRUBIN TOTAL: 0.5 mg/dL (ref 0.2–1.2)
TOTAL PROTEIN: 6.6 g/dL (ref 6.1–8.1)

## 2015-09-13 LAB — BASIC METABOLIC PANEL
BUN: 14 mg/dL (ref 7–25)
CALCIUM: 9 mg/dL (ref 8.6–10.4)
CO2: 27 mmol/L (ref 20–31)
Chloride: 101 mmol/L (ref 98–110)
Creat: 0.51 mg/dL (ref 0.50–0.99)
Glucose, Bld: 96 mg/dL (ref 65–99)
Potassium: 4 mmol/L (ref 3.5–5.3)
Sodium: 138 mmol/L (ref 135–146)

## 2015-09-13 LAB — LIPID PANEL
CHOL/HDL RATIO: 4.3 ratio (ref <=5.0)
Cholesterol: 222 mg/dL — ABNORMAL HIGH (ref 125–200)
HDL: 52 mg/dL (ref >=46)
LDL CALC: 137 mg/dL — AB (ref <130)
Triglycerides: 167 mg/dL — ABNORMAL HIGH (ref <150)
VLDL: 33 mg/dL — AB (ref <30)

## 2015-09-17 ENCOUNTER — Encounter: Payer: Self-pay | Admitting: Cardiology

## 2015-09-17 NOTE — Telephone Encounter (Signed)
This encounter was created in error - please disregard.

## 2015-09-17 NOTE — Telephone Encounter (Signed)
New message   Pt verbalized that she is calling for rn, and that she wants her lab results sent to [facility] for her appt tomorrow.

## 2015-09-18 DIAGNOSIS — E782 Mixed hyperlipidemia: Secondary | ICD-10-CM | POA: Diagnosis not present

## 2015-09-18 DIAGNOSIS — I10 Essential (primary) hypertension: Secondary | ICD-10-CM | POA: Diagnosis not present

## 2015-09-18 DIAGNOSIS — G629 Polyneuropathy, unspecified: Secondary | ICD-10-CM | POA: Diagnosis not present

## 2015-09-18 DIAGNOSIS — E039 Hypothyroidism, unspecified: Secondary | ICD-10-CM | POA: Diagnosis not present

## 2015-09-18 DIAGNOSIS — R7301 Impaired fasting glucose: Secondary | ICD-10-CM | POA: Diagnosis not present

## 2015-09-26 ENCOUNTER — Telehealth: Payer: Self-pay

## 2015-09-26 DIAGNOSIS — E785 Hyperlipidemia, unspecified: Secondary | ICD-10-CM

## 2015-09-26 MED ORDER — SIMVASTATIN 10 MG PO TABS: 10.0000 mg | ORAL_TABLET | Freq: Every day | ORAL | 3 refills | Status: DC

## 2015-09-26 MED ORDER — EZETIMIBE 10 MG PO TABS: 10.0000 mg | ORAL_TABLET | Freq: Every day | ORAL | 3 refills | Status: DC

## 2015-09-26 NOTE — Telephone Encounter (Signed)
-----   Message from Leeroy Bock, [facility] sent at 09/18/2015 12:07 PM EDT ----- Patient taking max dose simvastatin 10mg  given interaction with diltiazem. Would see if Zetia is affordable first and if pt is willing to try Zetia 10mg  daily in addition to her simvastatin.  [id number] would not likely be an option later due to cost considering Crestor is too expensive for her.

## 2015-09-26 NOTE — Telephone Encounter (Signed)
Patient agrees to try Zetia 10 mg daily. Rx called in. Patient unable to schedule fasting labs at this time. She will call back after vacation to schedule labs in 2 months. She was grateful for call.

## 2015-10-13 DIAGNOSIS — R21 Rash and other nonspecific skin eruption: Secondary | ICD-10-CM | POA: Diagnosis not present

## 2015-10-13 DIAGNOSIS — B354 Tinea corporis: Secondary | ICD-10-CM | POA: Diagnosis not present

## 2015-10-21 DIAGNOSIS — H2512 Age-related nuclear cataract, left eye: Secondary | ICD-10-CM | POA: Diagnosis not present

## 2015-10-22 DIAGNOSIS — H2511 Age-related nuclear cataract, right eye: Secondary | ICD-10-CM | POA: Diagnosis not present

## 2015-10-25 DIAGNOSIS — L304 Erythema intertrigo: Secondary | ICD-10-CM | POA: Diagnosis not present

## 2015-10-25 DIAGNOSIS — Z23 Encounter for immunization: Secondary | ICD-10-CM | POA: Diagnosis not present

## 2015-10-29 DIAGNOSIS — H2513 Age-related nuclear cataract, bilateral: Secondary | ICD-10-CM | POA: Diagnosis not present

## 2015-11-11 DIAGNOSIS — H2511 Age-related nuclear cataract, right eye: Secondary | ICD-10-CM | POA: Diagnosis not present

## 2015-11-19 DIAGNOSIS — H2513 Age-related nuclear cataract, bilateral: Secondary | ICD-10-CM | POA: Diagnosis not present

## 2015-11-21 ENCOUNTER — Other Ambulatory Visit: Payer: Medicare HMO

## 2015-11-25 ENCOUNTER — Other Ambulatory Visit: Payer: Medicare HMO | Admitting: *Deleted

## 2015-11-25 DIAGNOSIS — E785 Hyperlipidemia, unspecified: Secondary | ICD-10-CM | POA: Diagnosis not present

## 2015-11-25 LAB — LIPID PANEL
CHOL/HDL RATIO: 2.2 ratio (ref <=5.0)
Cholesterol: 148 mg/dL (ref 125–200)
HDL: 68 mg/dL (ref >=46)
LDL CALC: 61 mg/dL (ref <130)
Triglycerides: 95 mg/dL (ref <150)
VLDL: 19 mg/dL (ref <30)

## 2015-11-25 LAB — ALT: ALT: 9 U/L (ref 6–29)

## 2015-11-28 ENCOUNTER — Telehealth: Payer: Self-pay | Admitting: Cardiology

## 2015-11-28 NOTE — Telephone Encounter (Signed)
Phone rang x3 and cut off. Will try again later.    Pts results were released to [place].

## 2015-11-28 NOTE — Telephone Encounter (Signed)
Follow Up:     Pt would like her lab results from Monday please.

## 2015-11-28 NOTE — Telephone Encounter (Signed)
Pt cb. I advised pt of lab results. She voiced understanding and thanks. I also advised they were released to [place].

## 2016-02-13 DIAGNOSIS — L570 Actinic keratosis: Secondary | ICD-10-CM | POA: Diagnosis not present

## 2016-02-13 DIAGNOSIS — R202 Paresthesia of skin: Secondary | ICD-10-CM | POA: Diagnosis not present

## 2016-02-13 DIAGNOSIS — R7301 Impaired fasting glucose: Secondary | ICD-10-CM | POA: Diagnosis not present

## 2016-02-13 DIAGNOSIS — L821 Other seborrheic keratosis: Secondary | ICD-10-CM | POA: Diagnosis not present

## 2016-03-27 ENCOUNTER — Encounter: Payer: Self-pay | Admitting: Cardiology

## 2016-03-27 DIAGNOSIS — Z1211 Encounter for screening for malignant neoplasm of colon: Secondary | ICD-10-CM | POA: Diagnosis not present

## 2016-03-27 DIAGNOSIS — K5730 Diverticulosis of large intestine without perforation or abscess without bleeding: Secondary | ICD-10-CM | POA: Diagnosis not present

## 2016-04-06 ENCOUNTER — Ambulatory Visit (INDEPENDENT_AMBULATORY_CARE_PROVIDER_SITE_OTHER): Payer: Medicare HMO | Admitting: Cardiology

## 2016-04-06 ENCOUNTER — Encounter: Payer: Self-pay | Admitting: Cardiology

## 2016-04-06 VITALS — BP 164/92 | HR 67 | Ht 62.5 in | Wt 155.0 lb

## 2016-04-06 DIAGNOSIS — I6523 Occlusion and stenosis of bilateral carotid arteries: Secondary | ICD-10-CM | POA: Diagnosis not present

## 2016-04-06 DIAGNOSIS — I10 Essential (primary) hypertension: Secondary | ICD-10-CM

## 2016-04-06 DIAGNOSIS — I201 Angina pectoris with documented spasm: Secondary | ICD-10-CM

## 2016-04-06 DIAGNOSIS — E7800 Pure hypercholesterolemia, unspecified: Secondary | ICD-10-CM

## 2016-04-06 DIAGNOSIS — I4510 Unspecified right bundle-branch block: Secondary | ICD-10-CM | POA: Diagnosis not present

## 2016-04-06 NOTE — Progress Notes (Signed)
Cardiology Office Note    Date:  04/06/2016   ID:  [name], DOB 06-30-1945, MRN [id number]  PCP:  Mayra Neer, MD  Cardiologist:  Fransico Him, MD   Chief Complaint  Patient presents with  . Follow-up    Prinzmetals angina/HTN    History of Present Illness:  Vanessa Stewart is a 71 y.o. female with a history of prinzmetal's angina, HTN and RBBB who presents today for followup. She is doing well. She denies any chest pain or pressure, SOB, DOE, PND, orthopnea, dizziness, LE edema, palpitations or syncope. At last OV, she was switched from simvastatin to Liptor due to interaction with amlodipine.  She tried the lipitor but got muscle aches so she put herself back on simvastatin and muscle aches resolved.   Past Medical History:  Diagnosis Date  . Allergy   . Carotid artery stenosis 07/03/2015   1-39% bilateral by  dopplers 07/2015  . Depression   . Essential tremor   . Hyperlipidemia   . Hypertension   . Hypothyroidism   . Osteopenia   . Prinzmetal's angina (HCC)    normal coronary arteries with coronary artery vasospasm at time of cath  . RBBB    Chronic    Past Surgical History:  Procedure Laterality Date  . CARDIAC CATHETERIZATION  12/09   coronary spasm noted on heart cath. No coronary artery disease. LV function normal  . colonoscopy  07/07   ganem- 10 Years  . KNEE SURGERY    . THYROIDECTOMY    . TOTAL ABDOMINAL HYSTERECTOMY W/ BILATERAL SALPINGOOPHORECTOMY      Current Medications: Current Meds  Medication Sig  . ALPRAZolam (XANAX) 0.5 MG tablet Take 0.5 mg by mouth 2 (two) times daily as needed. (anxiety)  . aspirin 81 MG tablet Take 81 mg by mouth daily.  [name] diltiazem (CARDIZEM CD) 180 MG 24 hr capsule Take 180 mg by mouth daily.  [name] ezetimibe (ZETIA) 10 MG tablet Take 1 tablet (10 mg total) by mouth daily.  . folic acid (FOLVITE) 1 MG tablet Take 1 mg by mouth daily.  [name] HYDROcodone-acetaminophen (NORCO) 5-325 MG tablet One half tablet every 6  hours as needed for severe pain  . levothyroxine (SYNTHROID, LEVOTHROID) 50 MCG tablet Take 50 mcg by mouth daily before breakfast.  . lisinopril-hydrochlorothiazide (PRINZIDE,ZESTORETIC) 20-25 MG per tablet Take 1 tablet by mouth daily.  . Omega-3 Fatty Acids (FISH OIL) 1000 MG CAPS Take 1 capsule by mouth daily.  [name] PARoxetine (PAXIL) 20 MG tablet Take 20 mg by mouth daily.  . potassium chloride SA (KLOR-CON M20) 20 MEQ tablet Take 1 tablet (20 mEq total) by mouth 2 (two) times daily.  . simvastatin (ZOCOR) 10 MG tablet Take 1 tablet (10 mg total) by mouth at bedtime.  . thiamine (VITAMIN B-1) 100 MG tablet Take 100 mg by mouth daily.    Allergies:   Sulfa antibiotics; Septra [sulfamethoxazole-trimethoprim]; and Sulfur   Social History   Social History  . Marital status: Widowed    Spouse name: N/A  . Number of children: N/A  . Years of education: N/A   Social History Main Topics  . Smoking status: Never Smoker  . Smokeless tobacco: Never Used  . Alcohol use No  . Drug use: No  . Sexual activity: Not Asked   Other Topics Concern  . None   Social History Narrative  . None     Family History:  The patient's family history includes Heart disease in her  brother, brother, mother, and sister; Stroke in her father.   ROS:   Please see the history of present illness.    ROS All other systems reviewed and are negative.  No flowsheet data found.    PHYSICAL EXAM:   VS:  BP (!) 164/92   Pulse 67   Ht 5' 2.5" (1.588 m)   Wt 155 lb (70.3 kg)   SpO2 96%   BMI 27.90 kg/m    GEN: Well nourished, well developed, in no acute distress  HEENT: normal  Neck: no JVD, carotid bruits, or masses Cardiac: RRR; no murmurs, rubs, or gallops,no edema.  Intact distal pulses bilaterally.  Respiratory:  clear to auscultation bilaterally, normal work of breathing GI: soft, nontender, nondistended, + BS MS: no deformity or atrophy  Skin: warm and dry, no rash Neuro:  Alert and Oriented x  3, Strength and sensation are intact Psych: euthymic mood, full affect  Wt Readings from Last 3 Encounters:  04/06/16 155 lb (70.3 kg)  07/03/15 153 lb 6.4 oz (69.6 kg)  05/17/15 155 lb 6.4 oz (70.5 kg)      Studies/Labs Reviewed:   EKG:  EKG is not ordered today.  Recent Labs: 09/13/2015: BUN 14; Creat 0.51; Potassium 4.0; Sodium 138 11/25/2015: ALT 9   Lipid Panel    Component Value Date/Time   CHOL 148 11/25/2015 1053   TRIG 95 11/25/2015 1053   HDL 68 11/25/2015 1053   CHOLHDL 2.2 11/25/2015 1053   VLDL 19 11/25/2015 1053   LDLCALC 61 11/25/2015 1053   LDLDIRECT 114.0 07/03/2014 1046    Additional studies/ records that were reviewed today include:  none    ASSESSMENT:    1. Prinzmetal angina ([place])   2. Essential hypertension, benign   3. RBBB   4. Bilateral carotid artery stenosis   5. Pure hypercholesterolemia      PLAN:  In order of problems listed above:  1. Prinzmetal angina - she has not had any further angina.  Continue CCB 2. HTN - BP borderline controlled today but at home 136/47mmHg. Continue CCB and Prinizide.  I encouraged her to follow a low sodium diet.  She had a high sodium meal last night which likely increased today.  3. RBBB - old 4. Mild carotid artery stenosis (1-39%) by dopplers 07/2015. She will continue on ASA and low dose statin.   5. Hyperlipidemia with LDL goal < 70.  LDL was 61 on 11/25/2015. She did not tolerate Lipitor.  She cannot be on a  Higher dose of simvastatin due to interaction with Cardizem which she is on for her angina. She will continue on zetia/low dose statin.     Medication Adjustments/Labs and Tests Ordered: Current medicines are reviewed at length with the patient today.  Concerns regarding medicines are outlined above.  Medication changes, Labs and Tests ordered today are listed in the Patient Instructions below.  There are no Patient Instructions on file for this visit.   Signed, Fransico Him, MD    04/06/2016 11:22 AM    [facility] [address], [place], [place]  [address] Phone: [phone]; Fax: [phone]

## 2016-04-06 NOTE — Patient Instructions (Signed)

## 2016-05-04 DIAGNOSIS — Z0000 Encounter for general adult medical examination without abnormal findings: Secondary | ICD-10-CM | POA: Diagnosis not present

## 2016-05-04 DIAGNOSIS — E782 Mixed hyperlipidemia: Secondary | ICD-10-CM | POA: Diagnosis not present

## 2016-05-04 DIAGNOSIS — Z1159 Encounter for screening for other viral diseases: Secondary | ICD-10-CM | POA: Diagnosis not present

## 2016-05-04 DIAGNOSIS — I201 Angina pectoris with documented spasm: Secondary | ICD-10-CM | POA: Diagnosis not present

## 2016-05-04 DIAGNOSIS — R69 Illness, unspecified: Secondary | ICD-10-CM | POA: Diagnosis not present

## 2016-05-04 DIAGNOSIS — I10 Essential (primary) hypertension: Secondary | ICD-10-CM | POA: Diagnosis not present

## 2016-05-04 DIAGNOSIS — E039 Hypothyroidism, unspecified: Secondary | ICD-10-CM | POA: Diagnosis not present

## 2016-05-04 DIAGNOSIS — M810 Age-related osteoporosis without current pathological fracture: Secondary | ICD-10-CM | POA: Diagnosis not present

## 2016-05-04 DIAGNOSIS — G629 Polyneuropathy, unspecified: Secondary | ICD-10-CM | POA: Diagnosis not present

## 2016-05-04 DIAGNOSIS — R7303 Prediabetes: Secondary | ICD-10-CM | POA: Diagnosis not present

## 2016-05-15 ENCOUNTER — Telehealth: Payer: Self-pay | Admitting: Cardiology

## 2016-05-15 NOTE — Telephone Encounter (Signed)
New message    Pt c/o medication issue:  1. Name of Medication: pt said it's her cholesterol medication-she's not sure the name.  2. How are you currently taking this medication (dosage and times per day)? Once daily  3. Are you having a reaction (difficulty breathing--STAT)?   4. What is your medication issue? Pt states she isn't sure if it is causing her arms to ache but she wants to talk about it.

## 2016-05-15 NOTE — Telephone Encounter (Signed)
Patient states about 2 months ago, her legs started aching. Her legs still ache but has improved a little. Her biggest concern now is BUE aching for over 2 weeks.  She states she has been on Zocor for years and knows it isn't the issue. She spoke with her son who is a Software engineer. He told her the aching could be due to her Zetia she started last summer - that it could be a delayed reaction. She requests new medication recommendations.  To Lipid Clinic for recs.

## 2016-05-15 NOTE — Telephone Encounter (Signed)
If pt is convinced side effects are from Zetia, she can try cutting her tablet in half and taking 5mg  daily. If she does not notice any improvement in a few weeks, would have her stop therapy and monitor for any improvements in her muscle aches with repeat lipid panel in 2-3 months. Pt already taking max dose simvastatin 10mg  given interaction with diltiazem, is intolerant to Lipitor, and Crestor is cost prohibitive.

## 2016-05-20 NOTE — Telephone Encounter (Signed)
Cannot increase simvastatin any further as she is on CCB.  Could change to lipitor or crestor if she is willing

## 2016-05-20 NOTE — Telephone Encounter (Signed)
Patient states she already stopped her Zetia and has already seen some improvement with her arm aches. She does not wish to try a lower dose or continue the medication at all. Attempted to schedule lab work in 2-3 months, but patient had to go. She will call back to schedule.  She states that she would be willing to increase Simvastatin and change Cardizem if Dr. Radford Pax would like her to try that instead.

## 2016-05-20 NOTE — Telephone Encounter (Signed)
Follow Up   Vanessa Stewart is returning your call

## 2016-05-20 NOTE — Telephone Encounter (Signed)
Left message to call back to discuss Zetia.

## 2016-05-21 NOTE — Telephone Encounter (Signed)
It appears pt has been intolerant to lipitor. Crestor was cost-prohibitive at one time - I am not sure if this would still be the case so this could be an option or she could try pravastatin 40mg  daily.

## 2016-06-02 DIAGNOSIS — M79603 Pain in arm, unspecified: Secondary | ICD-10-CM | POA: Diagnosis not present

## 2016-06-02 DIAGNOSIS — I4510 Unspecified right bundle-branch block: Secondary | ICD-10-CM | POA: Diagnosis not present

## 2016-06-11 DIAGNOSIS — M810 Age-related osteoporosis without current pathological fracture: Secondary | ICD-10-CM | POA: Diagnosis not present

## 2016-06-15 ENCOUNTER — Ambulatory Visit
Admission: RE | Admit: 2016-06-15 | Discharge: 2016-06-15 | Disposition: A | Discharge status: Home / Self Care | Payer: Medicare HMO | Source: Ambulatory Visit | Attending: Family Medicine | Admitting: Family Medicine

## 2016-06-15 ENCOUNTER — Other Ambulatory Visit: Payer: Self-pay | Admitting: Family Medicine

## 2016-06-15 DIAGNOSIS — M4802 Spinal stenosis, cervical region: Secondary | ICD-10-CM | POA: Diagnosis not present

## 2016-06-15 DIAGNOSIS — R202 Paresthesia of skin: Secondary | ICD-10-CM

## 2016-07-01 DIAGNOSIS — M50823 Other cervical disc disorders at C6-C7 level: Secondary | ICD-10-CM | POA: Diagnosis not present

## 2016-07-01 DIAGNOSIS — M5081 Other cervical disc disorders,  high cervical region: Secondary | ICD-10-CM | POA: Diagnosis not present

## 2016-07-01 DIAGNOSIS — M50822 Other cervical disc disorders at C5-C6 level: Secondary | ICD-10-CM | POA: Diagnosis not present

## 2016-07-01 DIAGNOSIS — M50821 Other cervical disc disorders at C4-C5 level: Secondary | ICD-10-CM | POA: Diagnosis not present

## 2016-07-16 DIAGNOSIS — M50821 Other cervical disc disorders at C4-C5 level: Secondary | ICD-10-CM | POA: Diagnosis not present

## 2016-07-16 DIAGNOSIS — M50823 Other cervical disc disorders at C6-C7 level: Secondary | ICD-10-CM | POA: Diagnosis not present

## 2016-07-16 DIAGNOSIS — M50822 Other cervical disc disorders at C5-C6 level: Secondary | ICD-10-CM | POA: Diagnosis not present

## 2016-07-27 DIAGNOSIS — Z1231 Encounter for screening mammogram for malignant neoplasm of breast: Secondary | ICD-10-CM | POA: Diagnosis not present

## 2016-07-27 NOTE — Addendum Note (Signed)
Addended by: Harland German A on: 07/27/2016 03:58 PM   Modules accepted: Orders

## 2016-07-27 NOTE — Telephone Encounter (Signed)
Called patient to schedule fasting blood work. She states it costs money to have drawn at [facility] but not at PCP and requests to have her fasting labs drawn there. Patient states she is taking Simvastatin 20 mg daily and no Zetia. Reiterated to patient that Simvastatin 10 mg is max dose with dilt but she states she isn't having any problems and would like to continue.  Med list updated. Patient states she will call when labs are drawn.

## 2016-07-29 DIAGNOSIS — M50821 Other cervical disc disorders at C4-C5 level: Secondary | ICD-10-CM | POA: Diagnosis not present

## 2016-07-29 DIAGNOSIS — M50822 Other cervical disc disorders at C5-C6 level: Secondary | ICD-10-CM | POA: Diagnosis not present

## 2016-07-29 DIAGNOSIS — M50823 Other cervical disc disorders at C6-C7 level: Secondary | ICD-10-CM | POA: Diagnosis not present

## 2016-07-30 DIAGNOSIS — E782 Mixed hyperlipidemia: Secondary | ICD-10-CM | POA: Diagnosis not present

## 2016-08-06 ENCOUNTER — Telehealth: Payer: Self-pay | Admitting: Cardiology

## 2016-08-06 NOTE — Telephone Encounter (Signed)
Returned call to patient, made aware of results.  Verbalized she cannot and does not want to go back on Lipitor as she has tried this medication before and did not tolerate due to muscle aches.   Patient wondering what other options there are for her.   Advised exercise and a heart healthy diet are recommended as well.   Also advised I would route back to Dr. Radford Pax for further recommendations/advice.  Patient currently taking 20mg  simvastatin (reports this was increased by PCP).  Patient aware and verbalized understanding.

## 2016-08-06 NOTE — Telephone Encounter (Signed)
Patient returning a call for cholesterol results, states that if you can't reach her please leave a detailed vm.

## 2016-08-06 NOTE — Telephone Encounter (Signed)
Left message for patient (ok per DPR)-advised Dr. Radford Pax recommends lipid clinic, will send to scheduler to schedule appt with pharmacist.  Advised to call back with questions or concerns.

## 2016-08-06 NOTE — Telephone Encounter (Signed)
Refer to lipid clinic 

## 2016-08-10 DIAGNOSIS — M50822 Other cervical disc disorders at C5-C6 level: Secondary | ICD-10-CM | POA: Diagnosis not present

## 2016-08-10 DIAGNOSIS — M50823 Other cervical disc disorders at C6-C7 level: Secondary | ICD-10-CM | POA: Diagnosis not present

## 2016-08-10 DIAGNOSIS — M50821 Other cervical disc disorders at C4-C5 level: Secondary | ICD-10-CM | POA: Diagnosis not present

## 2016-08-11 NOTE — Telephone Encounter (Signed)
Left message to call back to schedule Lipid Clinic appointment.

## 2016-08-14 NOTE — Telephone Encounter (Signed)
Follow up      Returning a call to the nurse.  Pt did not want to schedule lipid clinic appt.  However, she did request a callback from the nurse

## 2016-08-14 NOTE — Telephone Encounter (Signed)
Patient states she spoke with her nephew who is a Storden at [facility] [facility] [employer] and he said she should stay on her statin since she has no side effects.  She refuses to schedule in the Lipid Clinic at this time.  Explained to patient that the appointment was to discuss possible alternatives to statin therapy. She reports she is going out of town and will stay on current therapy at this time. She will call back if she changes her mind.

## 2016-08-24 ENCOUNTER — Other Ambulatory Visit: Payer: Self-pay | Admitting: Cardiology

## 2016-09-11 DIAGNOSIS — M545 Low back pain: Secondary | ICD-10-CM | POA: Diagnosis not present

## 2016-11-02 DIAGNOSIS — R238 Other skin changes: Secondary | ICD-10-CM | POA: Diagnosis not present

## 2016-11-09 DIAGNOSIS — R300 Dysuria: Secondary | ICD-10-CM | POA: Diagnosis not present

## 2016-11-09 DIAGNOSIS — E782 Mixed hyperlipidemia: Secondary | ICD-10-CM | POA: Diagnosis not present

## 2016-11-09 DIAGNOSIS — M810 Age-related osteoporosis without current pathological fracture: Secondary | ICD-10-CM | POA: Diagnosis not present

## 2016-11-09 DIAGNOSIS — G629 Polyneuropathy, unspecified: Secondary | ICD-10-CM | POA: Diagnosis not present

## 2016-11-09 DIAGNOSIS — E039 Hypothyroidism, unspecified: Secondary | ICD-10-CM | POA: Diagnosis not present

## 2016-11-09 DIAGNOSIS — Z23 Encounter for immunization: Secondary | ICD-10-CM | POA: Diagnosis not present

## 2016-11-09 DIAGNOSIS — I10 Essential (primary) hypertension: Secondary | ICD-10-CM | POA: Diagnosis not present

## 2016-11-09 DIAGNOSIS — R7303 Prediabetes: Secondary | ICD-10-CM | POA: Diagnosis not present

## 2016-11-13 DIAGNOSIS — M79601 Pain in right arm: Secondary | ICD-10-CM | POA: Diagnosis not present

## 2016-11-27 DIAGNOSIS — M25511 Pain in right shoulder: Secondary | ICD-10-CM | POA: Diagnosis not present

## 2016-12-08 DIAGNOSIS — M25511 Pain in right shoulder: Secondary | ICD-10-CM | POA: Diagnosis not present

## 2017-02-10 ENCOUNTER — Other Ambulatory Visit: Payer: Self-pay | Admitting: Family Medicine

## 2017-02-10 ENCOUNTER — Ambulatory Visit
Admission: RE | Admit: 2017-02-10 | Discharge: 2017-02-10 | Disposition: A | Discharge status: Home / Self Care | Payer: Medicare HMO | Source: Ambulatory Visit | Attending: Family Medicine | Admitting: Family Medicine

## 2017-02-10 DIAGNOSIS — R002 Palpitations: Secondary | ICD-10-CM | POA: Diagnosis not present

## 2017-02-10 DIAGNOSIS — R0789 Other chest pain: Secondary | ICD-10-CM

## 2017-02-10 DIAGNOSIS — R079 Chest pain, unspecified: Secondary | ICD-10-CM | POA: Diagnosis not present

## 2017-02-11 ENCOUNTER — Encounter: Payer: Self-pay | Admitting: *Deleted

## 2017-02-16 ENCOUNTER — Encounter: Payer: Self-pay | Admitting: Cardiology

## 2017-02-16 ENCOUNTER — Ambulatory Visit: Payer: Medicare HMO | Admitting: Cardiology

## 2017-02-16 DIAGNOSIS — R0789 Other chest pain: Secondary | ICD-10-CM | POA: Diagnosis not present

## 2017-02-16 DIAGNOSIS — R079 Chest pain, unspecified: Secondary | ICD-10-CM | POA: Diagnosis not present

## 2017-02-16 NOTE — Progress Notes (Signed)
02/16/2017 Vanessa Stewart   [birth year]  [id number]  Primary Physician Mayra Neer, MD Primary Cardiologist: Dr. Radford Pax   Reason for Visit/CC: CP and Palpitations   HPI:  Vanessa Stewart is a 72 y.o. female with a history of prinzmetal's angina, HTN and RBBB. She has a h/o normal coronaries with coronary artery vasospasm at time of cath 10 years ago. This has been treated with cardizem. Pt is followed by Dr. Radford Pax.   She presents to clinic today after her PCP recommended f/u given recent symptoms of palpitations and chest pain. She has had intermitted SSCP, that feels like indigestion, for the past several weeks. Some relationship with meals and relieved with belching but can also occur w/o meals. She denies any exertional symptoms. She has also had intermitted left arm pain, as well as some palpations. She saw Dr. Brigitte Pulse and had labs. TSH and CBC were normal. K however was low at 3.3. She was placed on K supplementation w/ plans to repeat labs at Dr. Raul Del office tomorrow. She has noted improvement with less palpitations. No recurrence in the last several days.    Current Meds  Medication Sig  . ALPRAZolam (XANAX) 0.5 MG tablet Take 0.5 mg by mouth 2 (two) times daily as needed. (anxiety)  . aspirin 81 MG tablet Take 81 mg by mouth daily.  [name] diltiazem (CARDIZEM CD) 180 MG 24 hr capsule Take 180 mg by mouth daily.  [name] KLOR-CON M20 20 MEQ tablet TAKE ONE TABLET BY MOUTH TWICE DAILY  . levothyroxine (SYNTHROID, LEVOTHROID) 50 MCG tablet Take 50 mcg by mouth daily before breakfast.  . lisinopril-hydrochlorothiazide (PRINZIDE,ZESTORETIC) 20-25 MG per tablet Take 1 tablet by mouth daily.  . Omega-3 Fatty Acids (FISH OIL) 1000 MG CAPS Take 1 capsule by mouth daily.  [name] PARoxetine (PAXIL) 20 MG tablet Take 20 mg by mouth daily.  . simvastatin (ZOCOR) 20 MG tablet Take 20 mg by mouth daily.   Allergies  Allergen Reactions  . Fosamax [Alendronate] Other (See Comments)    Jaw pain   .  Lipitor [Atorvastatin] Other (See Comments)    Aches and pains   . Pravastatin Other (See Comments)    Aches and pains   . Septra [Sulfamethoxazole-Trimethoprim] Other (See Comments)    GI issues  . Sulfa Antibiotics Nausea And Vomiting    GI issues  . Sulfur Other (See Comments)    GI Issues  . Zetia [Ezetimibe] Other (See Comments)    Aches and pains    Past Medical History:  Diagnosis Date  . Allergy   . Carotid artery stenosis 07/03/2015   1-39% bilateral by  dopplers 07/2015  . Depression   . Essential tremor   . Hyperlipidemia   . Hypertension   . Hypothyroidism   . Osteopenia   . Prinzmetal's angina (HCC)    normal coronary arteries with coronary artery vasospasm at time of cath  . RBBB    Chronic   Family History  Problem Relation Age of Onset  . Heart disease Mother   . Stroke Father   . Heart disease Sister   . Heart disease Brother   . Heart disease Brother    Past Surgical History:  Procedure Laterality Date  . CARDIAC CATHETERIZATION  12/09   coronary spasm noted on heart cath. No coronary artery disease. LV function normal  . colonoscopy  07/07   ganem- 10 Years  . KNEE SURGERY    . THYROIDECTOMY    . TOTAL ABDOMINAL  HYSTERECTOMY W/ BILATERAL SALPINGOOPHORECTOMY     Social History   Socioeconomic History  . Marital status: Widowed    Spouse name: Not on file  . Number of children: Not on file  . Years of education: Not on file  . Highest education level: Not on file  Social Needs  . Financial resource strain: Not on file  . Food insecurity - worry: Not on file  . Food insecurity - inability: Not on file  . Transportation needs - medical: Not on file  . Transportation needs - non-medical: Not on file  Occupational History  . Not on file  Tobacco Use  . Smoking status: Never Smoker  . Smokeless tobacco: Never Used  Substance and Sexual Activity  . Alcohol use: No    Alcohol/week: 0.0 oz  . Drug use: No  . Sexual activity: Not on file    Other Topics Concern  . Not on file  Social History Narrative  . Not on file     Review of Systems: General: negative for chills, fever, night sweats or weight changes.  Cardiovascular: negative for chest pain, dyspnea on exertion, edema, orthopnea, palpitations, paroxysmal nocturnal dyspnea or shortness of breath Dermatological: negative for rash Respiratory: negative for cough or wheezing Urologic: negative for hematuria Abdominal: negative for nausea, vomiting, diarrhea, bright red blood per rectum, melena, or hematemesis Neurologic: negative for visual changes, syncope, or dizziness All other systems reviewed and are otherwise negative except as noted above.   Physical Exam:  Blood pressure 138/80, pulse 72, height 5' 2.5" (1.588 m), weight 155 lb 12.8 oz (70.7 kg), SpO2 98 %.  General appearance: alert, cooperative and no distress Neck: no adenopathy, no carotid bruit and no JVD Lungs: clear to auscultation bilaterally Heart: regular rate and rhythm, S1, S2 normal, no murmur, click, rub or gallop Extremities: extremities normal, atraumatic, no cyanosis or edema Pulses: 2+ and symmetric Skin: Skin color, texture, turgor normal. No rashes or lesions Neurologic: Grossly normal  EKG not performed, RRR on exam -- personally reviewed   ASSESSMENT AND PLAN:   1. Chest Pain: mixed typical and atypical features. Suspect some GI component, however she has also had some recurrence w/o relationship with meals and has had some occasional left arm pain. Given her other cardiac risk factors, we will set up for nuclear stress test to exclude coronary ischemia.   2. Palpitations: recent labs showed hypokalemia. PCP started supplementation with plans to get f/u labs later this week. TSH and CBC were normal. Pt notes improvement in palpitations. No recurrence in the last several days. Symptoms may have been triggered by hypokalemia. RRR on exam today. Pt will continue to monitor for recurrence.  If she has recurrent palpitations, we can later consider outpatient monitor to further evaluate.  3. HTN: controlled on current regimen.   5. HLD: on statin therapy with simvastatin (low dose, 20 mg, given use of Cardizem).   6. H/o Prinzmetal Angina: coronary vasospasm noted on previous cath. Has been managed with Cardizem.    Follow-Up after stress test in 1-2 weeks  Vanessa Stewart, MHS [facility] 02/16/2017 11:10 AM

## 2017-02-16 NOTE — Patient Instructions (Signed)
Your physician recommends that you continue on your current medications as directed. Please refer to the Current Medication list given to you today.   Your physician has requested that you have en exercise stress myoview. For further information please visit [email or web address]. Please follow instruction sheet, as given.   Your physician recommends that you schedule a follow-up appointment in: [place]

## 2017-02-18 DIAGNOSIS — E876 Hypokalemia: Secondary | ICD-10-CM | POA: Diagnosis not present

## 2017-02-22 ENCOUNTER — Telehealth (HOSPITAL_COMMUNITY): Payer: Self-pay | Admitting: *Deleted

## 2017-02-22 NOTE — Telephone Encounter (Signed)
Left message on voicemail per DPR in reference to upcoming appointment scheduled on 02/24/17 at 0730 with detailed instructions given per Myocardial Perfusion Study Information Sheet for the test. LM to arrive 15 minutes early, and that it is imperative to arrive on time for appointment to keep from having the test rescheduled. If you need to cancel or reschedule your appointment, please call the office within 24 hours of your appointment. Failure to do so may result in a cancellation of your appointment, and a $50 no show fee. Phone number given for call back for any questions.

## 2017-02-23 ENCOUNTER — Encounter: Payer: Self-pay | Admitting: Cardiology

## 2017-02-24 ENCOUNTER — Ambulatory Visit (HOSPITAL_COMMUNITY): Payer: Medicare HMO | Attending: Cardiology

## 2017-02-24 DIAGNOSIS — R0789 Other chest pain: Secondary | ICD-10-CM | POA: Diagnosis not present

## 2017-02-24 LAB — MYOCARDIAL PERFUSION IMAGING
CHL CUP MPHR: 150 {beats}/min
CHL CUP NUCLEAR SRS: 0
CHL CUP NUCLEAR SSS: 3
CSEPED: 5 min
CSEPEW: 7 METS
CSEPHR: 93 %
Exercise duration (sec): 32 s
LHR: 0.28
LV dias vol: 75 mL (ref 46–106)
LV sys vol: 28 mL
NUC STRESS TID: 0.84
Peak HR: 139 {beats}/min
RPE: 17
Rest HR: 66 {beats}/min
SDS: 3

## 2017-02-24 MED ORDER — TECHNETIUM TC 99M TETROFOSMIN IV KIT
32.8000 | PACK | Freq: Once | INTRAVENOUS | Status: AC | PRN
  Administered 2017-02-24: 32.8 via INTRAVENOUS
  Filled 2017-02-24: qty 33

## 2017-02-24 MED ORDER — TECHNETIUM TC 99M TETROFOSMIN IV KIT
10.1000 | PACK | Freq: Once | INTRAVENOUS | Status: AC | PRN
  Administered 2017-02-24: 10.1 via INTRAVENOUS
  Filled 2017-02-24: qty 11

## 2017-03-04 ENCOUNTER — Telehealth: Payer: Self-pay | Admitting: Cardiology

## 2017-03-04 NOTE — Telephone Encounter (Signed)
New message    Patient calling to confirm  If she needs to keep appt for 1/31. Patient states she already has results of stress test. Please advise

## 2017-03-04 NOTE — Telephone Encounter (Signed)
Called patient; left message to call back.

## 2017-03-04 NOTE — Telephone Encounter (Signed)
Returned call to patient. Patient wanted to know if she needed to keep appt with [name], [name] on 1/31 since her stress test was normal. Patient also due for a 12 month f/u with Dr. Radford Pax. Can I reschedule 1/31 appt with the next available appt with Dr. Radford Pax?

## 2017-03-04 NOTE — Telephone Encounter (Signed)
Spoke with patient and informed her that Dr. Radford Pax said to keep follow up appt with Ellen Henri PA on 1/31 and there would be no plans to see Dr. Radford Pax in 1 year.

## 2017-03-04 NOTE — Telephone Encounter (Signed)
Follow up   Patient returning

## 2017-03-04 NOTE — Telephone Encounter (Signed)
Since she is stable and stress test was fine she can keep her Appt with APP and that can be her 12 month followup

## 2017-03-11 ENCOUNTER — Ambulatory Visit: Payer: Medicare HMO | Admitting: Cardiology

## 2017-05-21 ENCOUNTER — Other Ambulatory Visit: Payer: Self-pay | Admitting: Family Medicine

## 2017-05-21 DIAGNOSIS — I10 Essential (primary) hypertension: Secondary | ICD-10-CM | POA: Diagnosis not present

## 2017-05-21 DIAGNOSIS — G629 Polyneuropathy, unspecified: Secondary | ICD-10-CM | POA: Diagnosis not present

## 2017-05-21 DIAGNOSIS — E782 Mixed hyperlipidemia: Secondary | ICD-10-CM | POA: Diagnosis not present

## 2017-05-21 DIAGNOSIS — R69 Illness, unspecified: Secondary | ICD-10-CM | POA: Diagnosis not present

## 2017-05-21 DIAGNOSIS — I201 Angina pectoris with documented spasm: Secondary | ICD-10-CM | POA: Diagnosis not present

## 2017-05-21 DIAGNOSIS — R7303 Prediabetes: Secondary | ICD-10-CM | POA: Diagnosis not present

## 2017-05-21 DIAGNOSIS — M810 Age-related osteoporosis without current pathological fracture: Secondary | ICD-10-CM | POA: Diagnosis not present

## 2017-05-21 DIAGNOSIS — I6523 Occlusion and stenosis of bilateral carotid arteries: Secondary | ICD-10-CM

## 2017-05-21 DIAGNOSIS — Z0000 Encounter for general adult medical examination without abnormal findings: Secondary | ICD-10-CM | POA: Diagnosis not present

## 2017-05-21 DIAGNOSIS — E039 Hypothyroidism, unspecified: Secondary | ICD-10-CM | POA: Diagnosis not present

## 2017-05-21 DIAGNOSIS — I6529 Occlusion and stenosis of unspecified carotid artery: Secondary | ICD-10-CM | POA: Diagnosis not present

## 2017-06-03 ENCOUNTER — Other Ambulatory Visit: Payer: Medicare HMO

## 2017-06-09 DIAGNOSIS — H524 Presbyopia: Secondary | ICD-10-CM | POA: Diagnosis not present

## 2017-06-14 ENCOUNTER — Ambulatory Visit
Admission: RE | Admit: 2017-06-14 | Discharge: 2017-06-14 | Disposition: A | Discharge status: Home / Self Care | Payer: Medicare HMO | Source: Ambulatory Visit | Attending: Family Medicine | Admitting: Family Medicine

## 2017-06-14 DIAGNOSIS — I6523 Occlusion and stenosis of bilateral carotid arteries: Secondary | ICD-10-CM

## 2017-06-28 ENCOUNTER — Encounter: Payer: Self-pay | Admitting: Cardiology

## 2017-07-30 DIAGNOSIS — Z1231 Encounter for screening mammogram for malignant neoplasm of breast: Secondary | ICD-10-CM | POA: Diagnosis not present

## 2017-08-03 ENCOUNTER — Encounter: Payer: Medicare HMO | Admitting: Vascular Surgery

## 2017-08-10 ENCOUNTER — Other Ambulatory Visit: Payer: Self-pay

## 2017-08-10 ENCOUNTER — Ambulatory Visit: Payer: Medicare HMO | Admitting: Vascular Surgery

## 2017-08-10 ENCOUNTER — Encounter: Payer: Self-pay | Admitting: Vascular Surgery

## 2017-08-10 VITALS — BP 164/82 | HR 75 | Temp 97.9°F | Resp 18 | Ht 62.5 in | Wt 155.1 lb

## 2017-08-10 DIAGNOSIS — I6522 Occlusion and stenosis of left carotid artery: Secondary | ICD-10-CM | POA: Diagnosis not present

## 2017-08-10 NOTE — Progress Notes (Signed)
Vascular and Vein Specialist of [facility]  Patient name: Vanessa Stewart MRN: [id number] DOB: [birth year] Sex: female  REASON FOR CONSULT: Evaluation carotid stenosis  HPI: Vanessa Stewart is a 72 y.o. female, who is for evaluation of carotid stenosis.  She reports that she had had prior evaluation at her cardiologist's office and also at a [facility] screening suggesting some left rotted stenosis which is asymptomatic.  She specifically denies any prior stroke or amaurosis fugax or a aphasia.  She has no history of coronary artery disease.  She is a non-smoker.  Past Medical History:  Diagnosis Date  . Allergy   . Carotid artery stenosis 07/03/2015   1-39% bilateral by  dopplers 07/2015  . Depression   . Essential tremor   . Hyperlipidemia   . Hypertension   . Hypothyroidism   . Osteopenia   . Prinzmetal's angina (HCC)    normal coronary arteries with coronary artery vasospasm at time of cath  . RBBB    Chronic    Family History  Problem Relation Age of Onset  . Heart disease Mother   . Stroke Father   . Heart disease Sister   . Heart disease Brother   . Heart disease Brother     SOCIAL HISTORY: Social History   Socioeconomic History  . Marital status: Widowed    Spouse name: Not on file  . Number of children: Not on file  . Years of education: Not on file  . Highest education level: Not on file  Occupational History  . Not on file  Social Needs  . Financial resource strain: Not on file  . Food insecurity:    Worry: Not on file    Inability: Not on file  . Transportation needs:    Medical: Not on file    Non-medical: Not on file  Tobacco Use  . Smoking status: Never Smoker  . Smokeless tobacco: Never Used  Substance and Sexual Activity  . Alcohol use: No    Alcohol/week: 0.0 oz  . Drug use: No  . Sexual activity: Not on file  Lifestyle  . Physical activity:    Days per week: Not on file    Minutes per session: Not on  file  . Stress: Not on file  Relationships  . Social connections:    Talks on phone: Not on file    Gets together: Not on file    Attends religious service: Not on file    Active member of club or organization: Not on file    Attends meetings of clubs or organizations: Not on file    Relationship status: Not on file  . Intimate partner violence:    Fear of current or ex partner: Not on file    Emotionally abused: Not on file    Physically abused: Not on file    Forced sexual activity: Not on file  Other Topics Concern  . Not on file  Social History Narrative  . Not on file    Allergies  Allergen Reactions  . Fosamax [Alendronate] Other (See Comments)    Jaw pain   . Lipitor [Atorvastatin] Other (See Comments)    Aches and pains   . Pravastatin Other (See Comments)    Aches and pains   . Septra [Sulfamethoxazole-Trimethoprim] Other (See Comments)    GI issues  . Sulfa Antibiotics Nausea And Vomiting    GI issues  . Sulfur Other (See Comments)    GI Issues  . Zetia [  Ezetimibe] Other (See Comments)    Aches and pains     Current Outpatient Medications  Medication Sig Dispense Refill  . ALPRAZolam (XANAX) 0.5 MG tablet Take 0.5 mg by mouth 2 (two) times daily as needed. (anxiety)    . aspirin 81 MG tablet Take 81 mg by mouth daily.    [name] CALCIUM CARBONATE PO Take by mouth.    . diltiazem (CARDIZEM CD) 180 MG 24 hr capsule Take 180 mg by mouth daily.    [name] KLOR-CON M20 20 MEQ tablet TAKE ONE TABLET BY MOUTH TWICE DAILY 180 tablet 2  . lisinopril-hydrochlorothiazide (PRINZIDE,ZESTORETIC) 20-25 MG per tablet Take 1 tablet by mouth daily.    . Omega-3 Fatty Acids (FISH OIL) 1000 MG CAPS Take 1 capsule by mouth daily.    [name] PARoxetine (PAXIL) 20 MG tablet Take 20 mg by mouth daily.    . simvastatin (ZOCOR) 20 MG tablet Take 20 mg by mouth daily.    [name] levothyroxine (SYNTHROID, LEVOTHROID) 50 MCG tablet Take 50 mcg by mouth daily before breakfast.     No current  facility-administered medications for this visit.     REVIEW OF SYSTEMS:  [X]  denotes positive finding, [ ]  denotes negative finding Cardiac  Comments:  Chest pain or chest pressure:    Shortness of breath upon exertion: x   Short of breath when lying flat:    Irregular heart rhythm:        Vascular    Pain in calf, thigh, or hip brought on by ambulation:    Pain in feet at night that wakes you up from your sleep:     Blood clot in your veins:    Leg swelling:  x       Pulmonary    Oxygen at home:    Productive cough:     Wheezing:         Neurologic    Sudden weakness in arms or legs:     Sudden numbness in arms or legs:     Sudden onset of difficulty speaking or slurred speech:    Temporary loss of vision in one eye:     Problems with dizziness:         Gastrointestinal    Blood in stool:     Vomited blood:         Genitourinary    Burning when urinating:     Blood in urine:        Psychiatric    Major depression:         Hematologic    Bleeding problems:    Problems with blood clotting too easily:        Skin    Rashes or ulcers:        Constitutional    Fever or chills:      PHYSICAL EXAM: Vitals:   08/10/17 1157 08/10/17 1158  BP: (!) 165/85 (!) 164/82  Pulse: 75   Resp: 18   Temp: 97.9 F (36.6 C)   TempSrc: Oral   SpO2: 96%   Weight: 155 lb 1.6 oz (70.4 kg)   Height: 5' 2.5" (1.588 m)     GENERAL: The patient is a well-nourished female, in no acute distress. The vital signs are documented above. CARDIOVASCULAR: Do not hear carotid bruits bilaterally.  She has 2+ radial 2+ femoral 2+ popliteal and 2+ dorsalis pedis pulses bilaterally PULMONARY: There is good air exchange  ABDOMEN: Soft and non-tender  MUSCULOSKELETAL: There are no major  deformities or cyanosis. NEUROLOGIC: No focal weakness or paresthesias are detected. SKIN: There are no ulcers or rashes noted. PSYCHIATRIC: The patient has a normal affect.  DATA:  Did review her  carotid duplex from 06/14/2017 and this shows no evidence of right-sided stenosis.  Her velocities suggest 50 to 69% stenosis in the left internal carotid artery  MEDICAL ISSUES: Asymptomatic moderate left carotid stenosis.  I explained that with her velocity this would certainly be in the lower end of the range.  I reviewed symptoms of carotid disease and she knows to present immediately to the emergency room should this occur.  Otherwise we will see her again in 1 year with repeat carotid duplex for surveillance to rule out progression of her asymptomatic disease   Rosetta Posner, MD [facility] Vascular and Vein Specialists of [facility] Tel [phone] Pager [phone]

## 2017-09-06 ENCOUNTER — Other Ambulatory Visit: Payer: Self-pay | Admitting: Cardiology

## 2017-11-22 DIAGNOSIS — Z23 Encounter for immunization: Secondary | ICD-10-CM | POA: Diagnosis not present

## 2017-11-22 DIAGNOSIS — E039 Hypothyroidism, unspecified: Secondary | ICD-10-CM | POA: Diagnosis not present

## 2017-11-22 DIAGNOSIS — I10 Essential (primary) hypertension: Secondary | ICD-10-CM | POA: Diagnosis not present

## 2017-11-22 DIAGNOSIS — E782 Mixed hyperlipidemia: Secondary | ICD-10-CM | POA: Diagnosis not present

## 2017-11-22 DIAGNOSIS — R7303 Prediabetes: Secondary | ICD-10-CM | POA: Diagnosis not present

## 2017-12-02 NOTE — Telephone Encounter (Signed)
Left the patient a voicemail to call back.  

## 2017-12-15 DIAGNOSIS — L821 Other seborrheic keratosis: Secondary | ICD-10-CM | POA: Diagnosis not present

## 2017-12-15 DIAGNOSIS — D2372 Other benign neoplasm of skin of left lower limb, including hip: Secondary | ICD-10-CM | POA: Diagnosis not present

## 2017-12-15 DIAGNOSIS — D2362 Other benign neoplasm of skin of left upper limb, including shoulder: Secondary | ICD-10-CM | POA: Diagnosis not present

## 2017-12-15 DIAGNOSIS — L570 Actinic keratosis: Secondary | ICD-10-CM | POA: Diagnosis not present

## 2017-12-15 DIAGNOSIS — D2272 Melanocytic nevi of left lower limb, including hip: Secondary | ICD-10-CM | POA: Diagnosis not present

## 2017-12-15 DIAGNOSIS — D1801 Hemangioma of skin and subcutaneous tissue: Secondary | ICD-10-CM | POA: Diagnosis not present

## 2017-12-29 DIAGNOSIS — R300 Dysuria: Secondary | ICD-10-CM | POA: Diagnosis not present

## 2017-12-29 DIAGNOSIS — N3000 Acute cystitis without hematuria: Secondary | ICD-10-CM | POA: Diagnosis not present

## 2017-12-30 NOTE — Telephone Encounter (Signed)
See Result note 

## 2018-01-27 DIAGNOSIS — E782 Mixed hyperlipidemia: Secondary | ICD-10-CM | POA: Diagnosis not present

## 2018-01-27 DIAGNOSIS — I10 Essential (primary) hypertension: Secondary | ICD-10-CM | POA: Diagnosis not present

## 2018-03-29 DIAGNOSIS — M79622 Pain in left upper arm: Secondary | ICD-10-CM | POA: Diagnosis not present

## 2018-04-04 ENCOUNTER — Other Ambulatory Visit: Payer: Self-pay | Admitting: Cardiology

## 2018-04-20 DIAGNOSIS — M79622 Pain in left upper arm: Secondary | ICD-10-CM | POA: Diagnosis not present

## 2018-04-22 ENCOUNTER — Ambulatory Visit
Admission: RE | Admit: 2018-04-22 | Discharge: 2018-04-22 | Disposition: A | Discharge status: Home / Self Care | Payer: Medicare HMO | Source: Ambulatory Visit | Attending: Family Medicine | Admitting: Family Medicine

## 2018-04-22 ENCOUNTER — Other Ambulatory Visit: Payer: Self-pay | Admitting: Family Medicine

## 2018-04-22 ENCOUNTER — Other Ambulatory Visit: Payer: Self-pay

## 2018-04-22 DIAGNOSIS — W19XXXA Unspecified fall, initial encounter: Secondary | ICD-10-CM

## 2018-04-22 DIAGNOSIS — S40011A Contusion of right shoulder, initial encounter: Secondary | ICD-10-CM | POA: Diagnosis not present

## 2018-04-22 DIAGNOSIS — S299XXA Unspecified injury of thorax, initial encounter: Secondary | ICD-10-CM | POA: Diagnosis not present

## 2018-04-22 DIAGNOSIS — S5001XA Contusion of right elbow, initial encounter: Secondary | ICD-10-CM | POA: Diagnosis not present

## 2018-04-22 DIAGNOSIS — S20211A Contusion of right front wall of thorax, initial encounter: Secondary | ICD-10-CM

## 2018-04-22 DIAGNOSIS — M25511 Pain in right shoulder: Secondary | ICD-10-CM

## 2018-04-22 DIAGNOSIS — R0781 Pleurodynia: Secondary | ICD-10-CM | POA: Diagnosis not present

## 2018-04-22 DIAGNOSIS — S20219A Contusion of unspecified front wall of thorax, initial encounter: Secondary | ICD-10-CM | POA: Diagnosis not present

## 2018-06-15 DIAGNOSIS — R7303 Prediabetes: Secondary | ICD-10-CM | POA: Diagnosis not present

## 2018-06-15 DIAGNOSIS — I10 Essential (primary) hypertension: Secondary | ICD-10-CM | POA: Diagnosis not present

## 2018-06-15 DIAGNOSIS — E782 Mixed hyperlipidemia: Secondary | ICD-10-CM | POA: Diagnosis not present

## 2018-06-15 DIAGNOSIS — E039 Hypothyroidism, unspecified: Secondary | ICD-10-CM | POA: Diagnosis not present

## 2018-06-17 DIAGNOSIS — M810 Age-related osteoporosis without current pathological fracture: Secondary | ICD-10-CM | POA: Diagnosis not present

## 2018-06-17 DIAGNOSIS — I4510 Unspecified right bundle-branch block: Secondary | ICD-10-CM | POA: Diagnosis not present

## 2018-06-17 DIAGNOSIS — G629 Polyneuropathy, unspecified: Secondary | ICD-10-CM | POA: Diagnosis not present

## 2018-06-17 DIAGNOSIS — E782 Mixed hyperlipidemia: Secondary | ICD-10-CM | POA: Diagnosis not present

## 2018-06-17 DIAGNOSIS — I10 Essential (primary) hypertension: Secondary | ICD-10-CM | POA: Diagnosis not present

## 2018-06-17 DIAGNOSIS — E039 Hypothyroidism, unspecified: Secondary | ICD-10-CM | POA: Diagnosis not present

## 2018-06-17 DIAGNOSIS — R69 Illness, unspecified: Secondary | ICD-10-CM | POA: Diagnosis not present

## 2018-06-17 DIAGNOSIS — I201 Angina pectoris with documented spasm: Secondary | ICD-10-CM | POA: Diagnosis not present

## 2018-06-17 DIAGNOSIS — R7303 Prediabetes: Secondary | ICD-10-CM | POA: Diagnosis not present

## 2018-06-17 DIAGNOSIS — Z0000 Encounter for general adult medical examination without abnormal findings: Secondary | ICD-10-CM | POA: Diagnosis not present

## 2018-07-06 DIAGNOSIS — H6991 Unspecified Eustachian tube disorder, right ear: Secondary | ICD-10-CM | POA: Diagnosis not present

## 2018-07-06 DIAGNOSIS — J309 Allergic rhinitis, unspecified: Secondary | ICD-10-CM | POA: Diagnosis not present

## 2018-07-12 DIAGNOSIS — H16209 Unspecified keratoconjunctivitis, unspecified eye: Secondary | ICD-10-CM | POA: Diagnosis not present

## 2018-07-28 DIAGNOSIS — H9201 Otalgia, right ear: Secondary | ICD-10-CM | POA: Diagnosis not present

## 2018-07-28 DIAGNOSIS — Z0100 Encounter for examination of eyes and vision without abnormal findings: Secondary | ICD-10-CM | POA: Diagnosis not present

## 2018-09-10 IMAGING — NM NM MISC PROCEDURE
3 series · 18 of 18 positions shown · non-contrast
Comparison: none

[Series 1: stress-gsp_(id)_sa · 6.4mm · 6.40mm/px · 6 of 512 frames shown]
[frame 43/512]
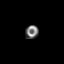
[frame 128/512]
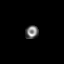
[frame 214/512]
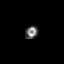
[frame 299/512]
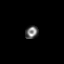
[frame 384/512]
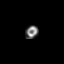
[frame 470/512]
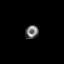

[Series 1: rest_(id)_sa · 6.4mm · 6.40mm/px · 6 of 64 frames shown]
[frame 6/64]
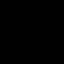
[frame 16/64]
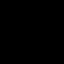
[frame 27/64]
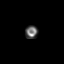
[frame 38/64]
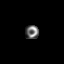
[frame 48/64]
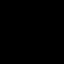
[frame 59/64]
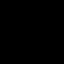

[Series 1: stress-sum-em_(id)_sa · 6.4mm · 6.40mm/px · 6 of 64 frames shown]
[frame 6/64]
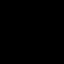
[frame 16/64]
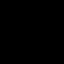
[frame 27/64]
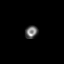
[frame 38/64]
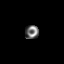
[frame 48/64]
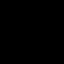
[frame 59/64]
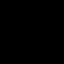

[18 of 18 positions shown; findings below may reference images not displayed]

Canned report from images found in remote index.

Refer to host system for actual result text.

## 2018-11-12 DIAGNOSIS — Z23 Encounter for immunization: Secondary | ICD-10-CM | POA: Diagnosis not present

## 2018-11-17 DIAGNOSIS — M5033 Other cervical disc degeneration, cervicothoracic region: Secondary | ICD-10-CM | POA: Diagnosis not present

## 2018-11-17 DIAGNOSIS — R69 Illness, unspecified: Secondary | ICD-10-CM | POA: Diagnosis not present

## 2018-11-17 DIAGNOSIS — M5032 Other cervical disc degeneration, mid-cervical region, unspecified level: Secondary | ICD-10-CM | POA: Diagnosis not present

## 2018-12-19 DIAGNOSIS — H9011 Conductive hearing loss, unilateral, right ear, with unrestricted hearing on the contralateral side: Secondary | ICD-10-CM | POA: Diagnosis not present

## 2018-12-19 DIAGNOSIS — H6501 Acute serous otitis media, right ear: Secondary | ICD-10-CM | POA: Diagnosis not present

## 2018-12-19 DIAGNOSIS — H90A31 Mixed conductive and sensorineural hearing loss, unilateral, right ear with restricted hearing on the contralateral side: Secondary | ICD-10-CM | POA: Diagnosis not present

## 2018-12-20 DIAGNOSIS — D2272 Melanocytic nevi of left lower limb, including hip: Secondary | ICD-10-CM | POA: Diagnosis not present

## 2018-12-20 DIAGNOSIS — D2362 Other benign neoplasm of skin of left upper limb, including shoulder: Secondary | ICD-10-CM | POA: Diagnosis not present

## 2018-12-20 DIAGNOSIS — L570 Actinic keratosis: Secondary | ICD-10-CM | POA: Diagnosis not present

## 2018-12-20 DIAGNOSIS — D1801 Hemangioma of skin and subcutaneous tissue: Secondary | ICD-10-CM | POA: Diagnosis not present

## 2018-12-20 DIAGNOSIS — L821 Other seborrheic keratosis: Secondary | ICD-10-CM | POA: Diagnosis not present

## 2018-12-20 DIAGNOSIS — D2372 Other benign neoplasm of skin of left lower limb, including hip: Secondary | ICD-10-CM | POA: Diagnosis not present

## 2018-12-21 DIAGNOSIS — E782 Mixed hyperlipidemia: Secondary | ICD-10-CM | POA: Diagnosis not present

## 2018-12-21 DIAGNOSIS — E039 Hypothyroidism, unspecified: Secondary | ICD-10-CM | POA: Diagnosis not present

## 2018-12-21 DIAGNOSIS — I10 Essential (primary) hypertension: Secondary | ICD-10-CM | POA: Diagnosis not present

## 2018-12-21 DIAGNOSIS — R7303 Prediabetes: Secondary | ICD-10-CM | POA: Diagnosis not present

## 2018-12-23 DIAGNOSIS — I10 Essential (primary) hypertension: Secondary | ICD-10-CM | POA: Diagnosis not present

## 2018-12-23 DIAGNOSIS — E039 Hypothyroidism, unspecified: Secondary | ICD-10-CM | POA: Diagnosis not present

## 2018-12-23 DIAGNOSIS — Z7189 Other specified counseling: Secondary | ICD-10-CM | POA: Diagnosis not present

## 2018-12-23 DIAGNOSIS — I341 Nonrheumatic mitral (valve) prolapse: Secondary | ICD-10-CM | POA: Diagnosis not present

## 2018-12-23 DIAGNOSIS — R7303 Prediabetes: Secondary | ICD-10-CM | POA: Diagnosis not present

## 2018-12-23 DIAGNOSIS — E782 Mixed hyperlipidemia: Secondary | ICD-10-CM | POA: Diagnosis not present

## 2018-12-26 ENCOUNTER — Telehealth: Payer: Self-pay

## 2018-12-26 NOTE — Telephone Encounter (Signed)
-----   Message from Sueanne Margarita, MD sent at 12/26/2018  2:40 PM EST ----- Please find out if patient is still taking cholesterol meds

## 2018-12-26 NOTE — Telephone Encounter (Signed)
lpmtcb 11/16 

## 2018-12-28 ENCOUNTER — Telehealth: Payer: Self-pay

## 2018-12-28 NOTE — Telephone Encounter (Signed)
Notes recorded by Frederik Schmidt, RN on 12/28/2018 at 2:12 PM EST  Letter sent to patient 11/18  ------

## 2018-12-28 NOTE — Telephone Encounter (Signed)
Notes recorded by Frederik Schmidt, RN on 12/28/2018 at 8:40 AM EST  lpmtcb 11/18  ------

## 2018-12-28 NOTE — Telephone Encounter (Signed)
-----   Message from Sueanne Margarita, MD sent at 12/26/2018  2:40 PM EST ----- Please find out if patient is still taking cholesterol meds

## 2019-01-03 ENCOUNTER — Encounter: Payer: Self-pay | Admitting: Cardiology

## 2019-01-03 NOTE — Telephone Encounter (Signed)
Patient is calling in regards to letter she received about cholesterol medication. Patient states she is still taking medication at lower dosage than originally prescribed. Patient also states she has went over it with PCP.

## 2019-01-03 NOTE — Telephone Encounter (Signed)
I called the patient back but got her VM. Wanted to find out specifically what dose/how often she is taking cholesterol medication.  Will route to Dr. Radford Pax to review below.

## 2019-01-03 NOTE — Telephone Encounter (Signed)
It appears PCP is following her lipids.

## 2019-01-03 NOTE — Telephone Encounter (Signed)
Error

## 2019-01-11 ENCOUNTER — Other Ambulatory Visit (HOSPITAL_COMMUNITY): Payer: Self-pay | Admitting: Family Medicine

## 2019-01-11 DIAGNOSIS — I341 Nonrheumatic mitral (valve) prolapse: Secondary | ICD-10-CM

## 2019-01-13 ENCOUNTER — Other Ambulatory Visit: Payer: Self-pay

## 2019-01-13 DIAGNOSIS — I6522 Occlusion and stenosis of left carotid artery: Secondary | ICD-10-CM

## 2019-01-17 ENCOUNTER — Ambulatory Visit: Payer: Medicare HMO | Admitting: Vascular Surgery

## 2019-01-17 ENCOUNTER — Encounter (HOSPITAL_COMMUNITY): Payer: Medicare HMO

## 2019-01-19 ENCOUNTER — Other Ambulatory Visit (HOSPITAL_COMMUNITY): Payer: Medicare HMO

## 2019-02-01 ENCOUNTER — Ambulatory Visit (HOSPITAL_COMMUNITY): Payer: Medicare HMO | Attending: Cardiology

## 2019-02-01 ENCOUNTER — Other Ambulatory Visit: Payer: Self-pay

## 2019-02-01 DIAGNOSIS — I341 Nonrheumatic mitral (valve) prolapse: Secondary | ICD-10-CM | POA: Diagnosis not present

## 2019-02-07 ENCOUNTER — Telehealth: Payer: Self-pay | Admitting: Cardiology

## 2019-02-07 NOTE — Telephone Encounter (Signed)
Left message for patient letting her know that her results will be faxed.

## 2019-02-07 NOTE — Telephone Encounter (Signed)
Patient would like to have results faxed to her PCP, Dr. Brigitte Pulse.  Fax #: [phone]

## 2019-02-07 NOTE — Telephone Encounter (Signed)
I will forward call to [facility] Dr. Theodosia Blender nurse Carly to follow up. Looks like PCP is who ordered the echo.

## 2019-02-08 NOTE — Telephone Encounter (Signed)
Left message for patient. Echo results have not been reviewed yet. I will call back with results once Dr. Radford Pax has looked at them.

## 2019-02-08 NOTE — Telephone Encounter (Signed)
Patient is calling wanting to know the results of her Echo and didn't know if she should schedule an appointment or could just get them over the phone from a nurse. Please advise.

## 2019-02-13 ENCOUNTER — Other Ambulatory Visit: Payer: Self-pay | Admitting: *Deleted

## 2019-02-13 DIAGNOSIS — I6522 Occlusion and stenosis of left carotid artery: Secondary | ICD-10-CM

## 2019-02-14 ENCOUNTER — Ambulatory Visit (HOSPITAL_COMMUNITY)
Admission: RE | Admit: 2019-02-14 | Discharge: 2019-02-14 | Disposition: A | Discharge status: Home / Self Care | Payer: Medicare HMO | Source: Ambulatory Visit | Attending: Vascular Surgery | Admitting: Vascular Surgery

## 2019-02-14 ENCOUNTER — Encounter: Payer: Self-pay | Admitting: Vascular Surgery

## 2019-02-14 ENCOUNTER — Other Ambulatory Visit: Payer: Self-pay

## 2019-02-14 ENCOUNTER — Ambulatory Visit (INDEPENDENT_AMBULATORY_CARE_PROVIDER_SITE_OTHER): Payer: Medicare HMO | Admitting: Vascular Surgery

## 2019-02-14 VITALS — BP 144/80 | HR 62 | Temp 97.3°F | Resp 20 | Ht 62.5 in | Wt 151.6 lb

## 2019-02-14 DIAGNOSIS — I6522 Occlusion and stenosis of left carotid artery: Secondary | ICD-10-CM | POA: Diagnosis not present

## 2019-02-14 NOTE — Progress Notes (Signed)
Vascular and Vein Specialist of [facility]  Patient name: Vanessa Stewart MRN: [id number] DOB: [birth year] Sex: female  REASON FOR VISIT: Follow-up carotid stenosis  HPI: Vanessa Stewart is a 74 y.o. female here today for continued follow-up.  She remains quite healthy.  She has had no new cardiac issues.  She has no neurologic deficits.  Specifically no amaurosis fugax, transient ischemic attack, aphasia or stroke.  She remains quite active with multiple grandchildren.  Past Medical History:  Diagnosis Date  . Allergy   . Carotid artery stenosis 07/03/2015   1-39% bilateral by  dopplers 07/2015  . Depression   . Essential tremor   . Hyperlipidemia   . Hypertension   . Hypothyroidism   . Osteopenia   . Prinzmetal's angina (HCC)    normal coronary arteries with coronary artery vasospasm at time of cath  . RBBB    Chronic    Family History  Problem Relation Age of Onset  . Heart disease Mother   . Stroke Father   . Heart disease Sister   . Heart disease Brother   . Heart disease Brother     SOCIAL HISTORY: Social History   Tobacco Use  . Smoking status: Never Smoker  . Smokeless tobacco: Never Used  Substance Use Topics  . Alcohol use: No    Alcohol/week: 0.0 standard drinks    Allergies  Allergen Reactions  . Fosamax [Alendronate] Other (See Comments)    Jaw pain   . Lipitor [Atorvastatin] Other (See Comments)    Aches and pains   . Pravastatin Other (See Comments)    Aches and pains   . Septra [Sulfamethoxazole-Trimethoprim] Other (See Comments)    GI issues  . Sulfa Antibiotics Nausea And Vomiting    GI issues  . Sulfur Other (See Comments)    GI Issues  . Zetia [Ezetimibe] Other (See Comments)    Aches and pains     Current Outpatient Medications  Medication Sig Dispense Refill  . ALPRAZolam (XANAX) 0.5 MG tablet Take 0.5 mg by mouth 2 (two) times daily as needed. (anxiety)    . aspirin 81 MG tablet Take 81  mg by mouth daily.    [name] CALCIUM CARBONATE PO Take by mouth.    . diltiazem (CARDIZEM CD) 180 MG 24 hr capsule Take 180 mg by mouth daily.    [name] levothyroxine (SYNTHROID, LEVOTHROID) 50 MCG tablet Take 50 mcg by mouth daily before breakfast.    . lisinopril-hydrochlorothiazide (PRINZIDE,ZESTORETIC) 20-25 MG per tablet Take 1 tablet by mouth daily.    . Omega-3 Fatty Acids (FISH OIL) 1000 MG CAPS Take 1 capsule by mouth daily.    [name] PARoxetine (PAXIL) 20 MG tablet Take 20 mg by mouth daily.    . potassium chloride SA (K-DUR,KLOR-CON) 20 MEQ tablet Take 1 tablet (20 mEq total) by mouth 2 (two) times daily. Please make overdue appt with Dr. Radford Pax before anymore refills. 1st attempt 60 tablet 0  . simvastatin (ZOCOR) 20 MG tablet Take 20 mg by mouth daily.     No current facility-administered medications for this visit.    REVIEW OF SYSTEMS:  [X]  denotes positive finding, [ ]  denotes negative finding Cardiac  Comments:  Chest pain or chest pressure:    Shortness of breath upon exertion:    Short of breath when lying flat:    Irregular heart rhythm:        Vascular    Pain in calf, thigh, or hip brought on  by ambulation:    Pain in feet at night that wakes you up from your sleep:     Blood clot in your veins:    Leg swelling:           PHYSICAL EXAM: Vitals:   02/14/19 0911 02/14/19 0913  BP: (!) 143/78 (!) 144/80  Pulse: 62   Resp: 20   Temp: (!) 97.3 F (36.3 C)   SpO2: 96%   Weight: 151 lb 9.6 oz (68.8 kg)   Height: 5' 2.5" (1.588 m)     GENERAL: The patient is a well-nourished female, in no acute distress. The vital signs are documented above. CARDIOVASCULAR: Carotid arteries without bruits bilaterally.  2+ radial and 2+ dorsalis pedis pulses bilaterally PULMONARY: There is good air exchange  MUSCULOSKELETAL: There are no major deformities or cyanosis. NEUROLOGIC: No focal weakness or paresthesias are detected. SKIN: There are no ulcers or rashes noted. PSYCHIATRIC: The  patient has a normal affect.  DATA:  Carotid duplex today shows no significant stenosis in the right carotid system.  Left is 40 to 59% stenosis.  This is slightly lower than her prior study in an outlying lab.  In May 2019 she was felt to be 50 to 69% stenosis on the left.  MEDICAL ISSUES: Moderate asymptomatic left carotid stenosis.  I again reviewed symptoms of carotid disease with her.  She knows to report immediately should this occur.  Otherwise we will see her again in 1 year with repeat carotid duplex    Rosetta Posner, MD [facility] Vascular and Vein Specialists of [facility] Tel [phone] Pager [phone]

## 2019-02-15 DIAGNOSIS — H6991 Unspecified Eustachian tube disorder, right ear: Secondary | ICD-10-CM | POA: Diagnosis not present

## 2019-02-17 ENCOUNTER — Other Ambulatory Visit: Payer: Self-pay | Admitting: *Deleted

## 2019-02-17 DIAGNOSIS — I6522 Occlusion and stenosis of left carotid artery: Secondary | ICD-10-CM

## 2019-03-20 ENCOUNTER — Telehealth: Payer: Self-pay | Admitting: Cardiology

## 2019-03-20 NOTE — Telephone Encounter (Signed)
We are recommending the COVID-19 vaccine to all of our patients. Cardiac medications (including blood thinners) should not deter anyone from being vaccinated and there is no need to hold any of those medications prior to vaccine administration.     Currently, there is a hotline to call (active 02/17/19) to schedule vaccination appointments as no walk-ins will be accepted.   Number: [phone].    If an appointment is not available please go to [email or web address] to sign up for notification when additional vaccine appointments are available.   If you have further questions or concerns about the vaccine process, please visit [email or web address] or contact your primary care physician.   

## 2019-04-07 DIAGNOSIS — Z1159 Encounter for screening for other viral diseases: Secondary | ICD-10-CM | POA: Diagnosis not present

## 2019-04-15 DIAGNOSIS — H04123 Dry eye syndrome of bilateral lacrimal glands: Secondary | ICD-10-CM | POA: Diagnosis not present

## 2019-04-15 DIAGNOSIS — H16143 Punctate keratitis, bilateral: Secondary | ICD-10-CM | POA: Diagnosis not present

## 2019-04-17 DIAGNOSIS — H6504 Acute serous otitis media, recurrent, right ear: Secondary | ICD-10-CM | POA: Diagnosis not present

## 2019-05-01 DIAGNOSIS — H6504 Acute serous otitis media, recurrent, right ear: Secondary | ICD-10-CM | POA: Diagnosis not present

## 2019-05-24 DIAGNOSIS — D2272 Melanocytic nevi of left lower limb, including hip: Secondary | ICD-10-CM | POA: Diagnosis not present

## 2019-05-24 DIAGNOSIS — D485 Neoplasm of uncertain behavior of skin: Secondary | ICD-10-CM | POA: Diagnosis not present

## 2019-05-24 DIAGNOSIS — L570 Actinic keratosis: Secondary | ICD-10-CM | POA: Diagnosis not present

## 2019-06-01 DIAGNOSIS — R69 Illness, unspecified: Secondary | ICD-10-CM | POA: Diagnosis not present

## 2019-06-02 DIAGNOSIS — Z1231 Encounter for screening mammogram for malignant neoplasm of breast: Secondary | ICD-10-CM | POA: Diagnosis not present

## 2019-06-21 DIAGNOSIS — I4510 Unspecified right bundle-branch block: Secondary | ICD-10-CM | POA: Diagnosis not present

## 2019-06-21 DIAGNOSIS — I10 Essential (primary) hypertension: Secondary | ICD-10-CM | POA: Diagnosis not present

## 2019-06-21 DIAGNOSIS — I201 Angina pectoris with documented spasm: Secondary | ICD-10-CM | POA: Diagnosis not present

## 2019-06-21 DIAGNOSIS — I6529 Occlusion and stenosis of unspecified carotid artery: Secondary | ICD-10-CM | POA: Diagnosis not present

## 2019-06-21 DIAGNOSIS — R69 Illness, unspecified: Secondary | ICD-10-CM | POA: Diagnosis not present

## 2019-06-21 DIAGNOSIS — E039 Hypothyroidism, unspecified: Secondary | ICD-10-CM | POA: Diagnosis not present

## 2019-06-21 DIAGNOSIS — Z0000 Encounter for general adult medical examination without abnormal findings: Secondary | ICD-10-CM | POA: Diagnosis not present

## 2019-06-21 DIAGNOSIS — R7309 Other abnormal glucose: Secondary | ICD-10-CM | POA: Diagnosis not present

## 2019-06-21 DIAGNOSIS — M810 Age-related osteoporosis without current pathological fracture: Secondary | ICD-10-CM | POA: Diagnosis not present

## 2019-06-21 DIAGNOSIS — E782 Mixed hyperlipidemia: Secondary | ICD-10-CM | POA: Diagnosis not present

## 2019-06-27 DIAGNOSIS — R69 Illness, unspecified: Secondary | ICD-10-CM | POA: Diagnosis not present

## 2019-07-07 DIAGNOSIS — M8589 Other specified disorders of bone density and structure, multiple sites: Secondary | ICD-10-CM | POA: Diagnosis not present

## 2019-07-07 DIAGNOSIS — Z780 Asymptomatic menopausal state: Secondary | ICD-10-CM | POA: Diagnosis not present

## 2019-07-18 DIAGNOSIS — M7541 Impingement syndrome of right shoulder: Secondary | ICD-10-CM | POA: Diagnosis not present

## 2019-07-18 DIAGNOSIS — M25511 Pain in right shoulder: Secondary | ICD-10-CM | POA: Diagnosis not present

## 2019-07-25 DIAGNOSIS — R69 Illness, unspecified: Secondary | ICD-10-CM | POA: Diagnosis not present

## 2019-08-23 ENCOUNTER — Other Ambulatory Visit: Payer: Self-pay | Admitting: Family Medicine

## 2019-08-23 DIAGNOSIS — R1031 Right lower quadrant pain: Secondary | ICD-10-CM

## 2019-08-25 ENCOUNTER — Ambulatory Visit
Admission: RE | Admit: 2019-08-25 | Discharge: 2019-08-25 | Disposition: A | Discharge status: Home / Self Care | Payer: Medicare HMO | Source: Ambulatory Visit | Attending: Family Medicine | Admitting: Family Medicine

## 2019-08-25 ENCOUNTER — Other Ambulatory Visit: Payer: Self-pay

## 2019-08-25 DIAGNOSIS — K5730 Diverticulosis of large intestine without perforation or abscess without bleeding: Secondary | ICD-10-CM | POA: Diagnosis not present

## 2019-08-25 DIAGNOSIS — K8020 Calculus of gallbladder without cholecystitis without obstruction: Secondary | ICD-10-CM | POA: Diagnosis not present

## 2019-08-25 DIAGNOSIS — R1031 Right lower quadrant pain: Secondary | ICD-10-CM

## 2019-08-25 MED ORDER — IOPAMIDOL (ISOVUE-300) INJECTION 61%
100.0000 mL | Freq: Once | INTRAVENOUS | Status: AC | PRN
  Administered 2019-08-25: 100 mL via INTRAVENOUS

## 2019-08-30 ENCOUNTER — Other Ambulatory Visit: Payer: Medicare HMO

## 2019-09-06 DIAGNOSIS — H04123 Dry eye syndrome of bilateral lacrimal glands: Secondary | ICD-10-CM | POA: Diagnosis not present

## 2019-09-06 DIAGNOSIS — H16143 Punctate keratitis, bilateral: Secondary | ICD-10-CM | POA: Diagnosis not present

## 2019-09-26 DIAGNOSIS — R69 Illness, unspecified: Secondary | ICD-10-CM | POA: Diagnosis not present

## 2019-09-28 DIAGNOSIS — R69 Illness, unspecified: Secondary | ICD-10-CM | POA: Diagnosis not present

## 2019-10-09 DIAGNOSIS — M25511 Pain in right shoulder: Secondary | ICD-10-CM | POA: Diagnosis not present

## 2019-10-12 DIAGNOSIS — M75120 Complete rotator cuff tear or rupture of unspecified shoulder, not specified as traumatic: Secondary | ICD-10-CM | POA: Insufficient documentation

## 2019-10-12 DIAGNOSIS — M25511 Pain in right shoulder: Secondary | ICD-10-CM | POA: Diagnosis not present

## 2019-10-12 DIAGNOSIS — M75121 Complete rotator cuff tear or rupture of right shoulder, not specified as traumatic: Secondary | ICD-10-CM | POA: Diagnosis not present

## 2019-11-01 DIAGNOSIS — Z23 Encounter for immunization: Secondary | ICD-10-CM | POA: Diagnosis not present

## 2019-11-01 DIAGNOSIS — R300 Dysuria: Secondary | ICD-10-CM | POA: Diagnosis not present

## 2019-11-17 DIAGNOSIS — M25511 Pain in right shoulder: Secondary | ICD-10-CM | POA: Diagnosis not present

## 2019-11-17 DIAGNOSIS — H6980 Other specified disorders of Eustachian tube, unspecified ear: Secondary | ICD-10-CM | POA: Diagnosis not present

## 2019-11-23 DIAGNOSIS — H6504 Acute serous otitis media, recurrent, right ear: Secondary | ICD-10-CM | POA: Diagnosis not present

## 2019-11-23 DIAGNOSIS — H7111 Cholesteatoma of tympanum, right ear: Secondary | ICD-10-CM | POA: Diagnosis not present

## 2019-12-04 DIAGNOSIS — Z8669 Personal history of other diseases of the nervous system and sense organs: Secondary | ICD-10-CM | POA: Diagnosis not present

## 2019-12-04 DIAGNOSIS — J301 Allergic rhinitis due to pollen: Secondary | ICD-10-CM | POA: Diagnosis not present

## 2019-12-20 DIAGNOSIS — Z20822 Contact with and (suspected) exposure to covid-19: Secondary | ICD-10-CM | POA: Diagnosis not present

## 2019-12-25 DIAGNOSIS — R69 Illness, unspecified: Secondary | ICD-10-CM | POA: Diagnosis not present

## 2019-12-25 DIAGNOSIS — I10 Essential (primary) hypertension: Secondary | ICD-10-CM | POA: Diagnosis not present

## 2019-12-25 DIAGNOSIS — E782 Mixed hyperlipidemia: Secondary | ICD-10-CM | POA: Diagnosis not present

## 2019-12-25 DIAGNOSIS — I700 Atherosclerosis of aorta: Secondary | ICD-10-CM | POA: Diagnosis not present

## 2019-12-25 DIAGNOSIS — E039 Hypothyroidism, unspecified: Secondary | ICD-10-CM | POA: Diagnosis not present

## 2019-12-25 DIAGNOSIS — R7303 Prediabetes: Secondary | ICD-10-CM | POA: Diagnosis not present

## 2019-12-25 DIAGNOSIS — J069 Acute upper respiratory infection, unspecified: Secondary | ICD-10-CM | POA: Diagnosis not present

## 2019-12-27 DIAGNOSIS — S46011A Strain of muscle(s) and tendon(s) of the rotator cuff of right shoulder, initial encounter: Secondary | ICD-10-CM | POA: Diagnosis not present

## 2020-01-17 DIAGNOSIS — R69 Illness, unspecified: Secondary | ICD-10-CM | POA: Diagnosis not present

## 2020-02-14 DIAGNOSIS — M75121 Complete rotator cuff tear or rupture of right shoulder, not specified as traumatic: Secondary | ICD-10-CM | POA: Diagnosis not present

## 2020-02-14 DIAGNOSIS — J301 Allergic rhinitis due to pollen: Secondary | ICD-10-CM | POA: Diagnosis not present

## 2020-02-14 DIAGNOSIS — M25511 Pain in right shoulder: Secondary | ICD-10-CM | POA: Diagnosis not present

## 2020-02-15 DIAGNOSIS — D1801 Hemangioma of skin and subcutaneous tissue: Secondary | ICD-10-CM | POA: Diagnosis not present

## 2020-02-15 DIAGNOSIS — D2272 Melanocytic nevi of left lower limb, including hip: Secondary | ICD-10-CM | POA: Diagnosis not present

## 2020-02-15 DIAGNOSIS — D2372 Other benign neoplasm of skin of left lower limb, including hip: Secondary | ICD-10-CM | POA: Diagnosis not present

## 2020-02-15 DIAGNOSIS — L570 Actinic keratosis: Secondary | ICD-10-CM | POA: Diagnosis not present

## 2020-02-15 DIAGNOSIS — L820 Inflamed seborrheic keratosis: Secondary | ICD-10-CM | POA: Diagnosis not present

## 2020-02-15 DIAGNOSIS — L821 Other seborrheic keratosis: Secondary | ICD-10-CM | POA: Diagnosis not present

## 2020-02-15 DIAGNOSIS — R300 Dysuria: Secondary | ICD-10-CM | POA: Diagnosis not present

## 2020-02-26 ENCOUNTER — Encounter (HOSPITAL_COMMUNITY): Payer: Medicare HMO

## 2020-02-26 ENCOUNTER — Ambulatory Visit: Payer: Medicare HMO

## 2020-03-01 ENCOUNTER — Ambulatory Visit: Payer: Self-pay

## 2020-03-01 ENCOUNTER — Encounter (HOSPITAL_COMMUNITY): Payer: Medicare HMO

## 2020-03-15 ENCOUNTER — Other Ambulatory Visit: Payer: Self-pay | Admitting: Family Medicine

## 2020-03-15 ENCOUNTER — Ambulatory Visit
Admission: RE | Admit: 2020-03-15 | Discharge: 2020-03-15 | Disposition: A | Discharge status: Home / Self Care | Payer: Medicare HMO | Source: Ambulatory Visit | Attending: Family Medicine | Admitting: Family Medicine

## 2020-03-15 DIAGNOSIS — I201 Angina pectoris with documented spasm: Secondary | ICD-10-CM | POA: Diagnosis not present

## 2020-03-15 DIAGNOSIS — K219 Gastro-esophageal reflux disease without esophagitis: Secondary | ICD-10-CM | POA: Diagnosis not present

## 2020-03-15 DIAGNOSIS — R079 Chest pain, unspecified: Secondary | ICD-10-CM | POA: Diagnosis not present

## 2020-03-15 DIAGNOSIS — K861 Other chronic pancreatitis: Secondary | ICD-10-CM

## 2020-03-15 DIAGNOSIS — R140 Abdominal distension (gaseous): Secondary | ICD-10-CM | POA: Diagnosis not present

## 2020-03-22 ENCOUNTER — Telehealth: Payer: Self-pay | Admitting: Cardiology

## 2020-03-22 NOTE — Telephone Encounter (Signed)
I am fine with that 

## 2020-03-22 NOTE — Telephone Encounter (Signed)
[place] with me Candee Furbish, MD

## 2020-03-22 NOTE — Telephone Encounter (Signed)
New Message  Pt would like to switch from Dr. Radford Pax to Dr. Marlou Porch, she said her friend recommended Dr. Marlou Porch.

## 2020-03-25 DIAGNOSIS — M25511 Pain in right shoulder: Secondary | ICD-10-CM | POA: Insufficient documentation

## 2020-03-26 ENCOUNTER — Other Ambulatory Visit: Payer: Self-pay

## 2020-03-26 DIAGNOSIS — I6522 Occlusion and stenosis of left carotid artery: Secondary | ICD-10-CM

## 2020-03-27 ENCOUNTER — Ambulatory Visit: Payer: Self-pay

## 2020-03-27 ENCOUNTER — Encounter (HOSPITAL_COMMUNITY): Payer: Medicare HMO

## 2020-04-01 ENCOUNTER — Ambulatory Visit: Payer: Medicare HMO | Admitting: Cardiology

## 2020-04-03 DIAGNOSIS — M25511 Pain in right shoulder: Secondary | ICD-10-CM | POA: Diagnosis not present

## 2020-04-19 DIAGNOSIS — M25511 Pain in right shoulder: Secondary | ICD-10-CM | POA: Diagnosis not present

## 2020-04-24 ENCOUNTER — Ambulatory Visit: Payer: Medicare HMO | Admitting: Physician Assistant

## 2020-04-24 ENCOUNTER — Other Ambulatory Visit: Payer: Self-pay

## 2020-04-24 ENCOUNTER — Ambulatory Visit (HOSPITAL_COMMUNITY)
Admission: RE | Admit: 2020-04-24 | Discharge: 2020-04-24 | Disposition: A | Discharge status: Home / Self Care | Payer: Medicare HMO | Source: Ambulatory Visit | Attending: Vascular Surgery | Admitting: Vascular Surgery

## 2020-04-24 VITALS — BP 138/79 | HR 72 | Temp 97.8°F | Ht 62.5 in | Wt 150.7 lb

## 2020-04-24 DIAGNOSIS — I6522 Occlusion and stenosis of left carotid artery: Secondary | ICD-10-CM | POA: Diagnosis not present

## 2020-04-24 NOTE — Progress Notes (Signed)
History of Present Illness:  Patient is a 75 y.o. year old female who presents for evaluation of carotid stenosis.  The patient denies symptoms of TIA, amaurosis, or stroke.  The patient is currently on ASA antiplatelet therapy and Crestor for hyperlipidemia.      Past Medical History:  Diagnosis Date  . Allergy   . Carotid artery stenosis 07/03/2015   1-39% bilateral by  dopplers 07/2015  . Depression   . Essential tremor   . Hyperlipidemia   . Hypertension   . Hypothyroidism   . Osteopenia   . Prinzmetal's angina (HCC)    normal coronary arteries with coronary artery vasospasm at time of cath  . RBBB    Chronic    Past Surgical History:  Procedure Laterality Date  . CARDIAC CATHETERIZATION  12/09   coronary spasm noted on heart cath. No coronary artery disease. LV function normal  . colonoscopy  07/07   ganem- 10 Years  . KNEE SURGERY    . THYROIDECTOMY    . TOTAL ABDOMINAL HYSTERECTOMY W/ BILATERAL SALPINGOOPHORECTOMY       Social History Social History   Tobacco Use  . Smoking status: Never Smoker  . Smokeless tobacco: Never Used  Vaping Use  . Vaping Use: Never used  Substance Use Topics  . Alcohol use: No    Alcohol/week: 0.0 standard drinks  . Drug use: No    Family History Family History  Problem Relation Age of Onset  . Heart disease Mother   . Stroke Father   . Heart disease Sister   . Heart disease Brother   . Heart disease Brother     Allergies  Allergies  Allergen Reactions  . Elemental Sulfur Other (See Comments)    GI Issues  . Fosamax [Alendronate] Other (See Comments)    Jaw pain   . Lipitor [Atorvastatin] Other (See Comments)    Aches and pains   . Pravastatin Other (See Comments)    Aches and pains   . Septra [Sulfamethoxazole-Trimethoprim] Other (See Comments)    GI issues  . Sulfa Antibiotics Nausea And Vomiting    GI issues  . Zetia [Ezetimibe] Other (See Comments)    Aches and pains      Current  Outpatient Medications  Medication Sig Dispense Refill  . ALPRAZolam (XANAX) 0.5 MG tablet Take 0.5 mg by mouth 2 (two) times daily as needed. (anxiety)    . aspirin 81 MG tablet Take 81 mg by mouth daily.    [name] CALCIUM CARBONATE PO Take by mouth.    . diltiazem (CARDIZEM CD) 180 MG 24 hr capsule Take 180 mg by mouth daily.    [name] levothyroxine (SYNTHROID, LEVOTHROID) 50 MCG tablet Take 50 mcg by mouth daily before breakfast.    . lisinopril-hydrochlorothiazide (PRINZIDE,ZESTORETIC) 20-25 MG per tablet Take 1 tablet by mouth daily.    . Omega-3 Fatty Acids (FISH OIL) 1000 MG CAPS Take 1 capsule by mouth daily.    [name] PARoxetine (PAXIL) 20 MG tablet Take 20 mg by mouth daily.    . potassium chloride SA (K-DUR,KLOR-CON) 20 MEQ tablet Take 1 tablet (20 mEq total) by mouth 2 (two) times daily. Please make overdue appt with Dr. Radford Pax before anymore refills. 1st attempt 60 tablet 0  . rosuvastatin (CRESTOR) 5 MG tablet Take 5 mg by mouth daily.     No current facility-administered medications for this visit.    ROS:   General:  No weight loss, Fever,  chills  HEENT: No recent headaches, no nasal bleeding, no visual changes, no sore throat  Neurologic: No dizziness, blackouts, seizures. No recent symptoms of stroke or mini- stroke. No recent episodes of slurred speech, or temporary blindness.  Cardiac: No recent episodes of chest pain/pressure, no shortness of breath at rest.  No shortness of breath with exertion.  Denies history of atrial fibrillation or irregular heartbeat  Vascular: No history of rest pain in feet.  No history of claudication.  No history of non-healing ulcer, No history of DVT   Pulmonary: No home oxygen, no productive cough, no hemoptysis,  No asthma or wheezing  Musculoskeletal:  [ ]  Arthritis, [ ]  Low back pain,  [x ] Joint pain  Hematologic:No history of hypercoagulable state.  No history of easy bleeding.  No history of anemia  Gastrointestinal: No hematochezia or  melena,  No gastroesophageal reflux, no trouble swallowing  Urinary: [ ]  chronic Kidney disease, [ ]  on HD - [ ]  MWF or [ ]  TTHS, [ ]  Burning with urination, [ ]  Frequent urination, [ ]  Difficulty urinating;   Skin: No rashes  Psychological: No history of anxiety,  No history of depression   Physical Examination  Vitals:   04/24/20 1443 04/24/20 1445  BP: 139/82 138/79  Pulse: 72   Temp: 97.8 F (36.6 C)   TempSrc: Temporal   SpO2: 97%   Weight: 150 lb 11.2 oz (68.4 kg)   Height: 5' 2.5" (1.588 m)     Body mass index is 27.12 kg/m.  General:  Alert and oriented, no acute distress HEENT: Normal Neck: No bruit or JVD Pulmonary: Clear to auscultation bilaterally Cardiac: Regular Rate and Rhythm without murmur Gastrointestinal: Soft, non-tender, non-distended, no mass, no scars Skin: No rash Extremity Pulses:  2+ radial, brachial, femoral, dorsalis pedis pulses bilaterally Musculoskeletal: No deformity or edema  Neurologic: Upper and lower extremity motor 5/5 and symmetric  DATA:    Right Carotid Findings:  +----------+--------+--------+--------+------------------+--------+       PSV cm/sEDV cm/sStenosisPlaque DescriptionComments  +----------+--------+--------+--------+------------------+--------+  CCA Prox 45   10                      +----------+--------+--------+--------+------------------+--------+  CCA Distal37   14                      +----------+--------+--------+--------+------------------+--------+  ICA Prox 42   15   1-39%  heterogenous         +----------+--------+--------+--------+------------------+--------+  ICA Mid  68   29                      +----------+--------+--------+--------+------------------+--------+  ICA Distal112   43                       +----------+--------+--------+--------+------------------+--------+  ECA    40   7                       +----------+--------+--------+--------+------------------+--------+   +----------+--------+-------+----------------+-------------------+       PSV cm/sEDV cmsDescribe    Arm Pressure (mmHG)  +----------+--------+-------+----------------+-------------------+  [id number]       Multiphasic, WNL            +----------+--------+-------+----------------+-------------------+   +---------+--------+--+--------+--+---------+  VertebralPSV cm/s70EDV cm/s18Antegrade  +---------+--------+--+--------+--+---------+      Left Carotid Findings:  +----------+--------+--------+--------+------------------+--------+       PSV cm/sEDV cm/sStenosisPlaque DescriptionComments  +----------+--------+--------+--------+------------------+--------+  CCA Prox 61   15                      +----------+--------+--------+--------+------------------+--------+  CCA Distal52   14                      +----------+--------+--------+--------+------------------+--------+  ICA Prox 54   21   1-39%  heterogenous         +----------+--------+--------+--------+------------------+--------+  ICA Mid  95   37   1-39%                 +----------+--------+--------+--------+------------------+--------+  ICA Distal143   43   40-59%          tortuous  +----------+--------+--------+--------+------------------+--------+  ECA    85   21       heterogenous         +----------+--------+--------+--------+------------------+--------+   +----------+--------+--------+----------------+-------------------+       PSV cm/sEDV cm/sDescribe    Arm Pressure (mmHG)   +----------+--------+--------+----------------+-------------------+  [id number]       Multiphasic, WNL            +----------+--------+--------+----------------+-------------------+   +---------+--------+--+--------+--+---------+  VertebralPSV cm/s49EDV cm/s19Antegrade  +---------+--------+--+--------+--+---------+         Summary:  Right Carotid: Velocities in the right ICA are consistent with a 1-39%  stenosis.   Left Carotid: Velocities in the left ICA are consistent with a 40-59%  stenosis ,        distal segment, due to tortuosity.    ASSESSMENT:  Asymptomatic B Carotid stenosis Her stenosis is accentually unchanged right ICA < 39%, left < 59%  PLAN:Plan for her to f/u in 1 year for repeat carotid duplex.  We reviewed signs and symptoms of stroke and TIA.  If these occur she will call 911.     Roxy Horseman PA-C [facility] of [place] Office: [phone]  MD in clinic [place]

## 2020-05-10 DIAGNOSIS — M25511 Pain in right shoulder: Secondary | ICD-10-CM | POA: Diagnosis not present

## 2020-05-16 DIAGNOSIS — R300 Dysuria: Secondary | ICD-10-CM | POA: Diagnosis not present

## 2020-05-17 ENCOUNTER — Encounter: Payer: Self-pay | Admitting: Cardiology

## 2020-05-17 ENCOUNTER — Other Ambulatory Visit: Payer: Self-pay

## 2020-05-17 ENCOUNTER — Ambulatory Visit: Payer: Medicare HMO | Admitting: Cardiology

## 2020-05-17 VITALS — BP 140/80 | HR 73 | Ht 62.5 in | Wt 149.0 lb

## 2020-05-17 DIAGNOSIS — I201 Angina pectoris with documented spasm: Secondary | ICD-10-CM | POA: Diagnosis not present

## 2020-05-17 DIAGNOSIS — Z01812 Encounter for preprocedural laboratory examination: Secondary | ICD-10-CM

## 2020-05-17 DIAGNOSIS — I6523 Occlusion and stenosis of bilateral carotid arteries: Secondary | ICD-10-CM | POA: Diagnosis not present

## 2020-05-17 DIAGNOSIS — R072 Precordial pain: Secondary | ICD-10-CM | POA: Diagnosis not present

## 2020-05-17 DIAGNOSIS — I4510 Unspecified right bundle-branch block: Secondary | ICD-10-CM | POA: Diagnosis not present

## 2020-05-17 MED ORDER — METOPROLOL TARTRATE 100 MG PO TABS: 100.0000 mg | ORAL_TABLET | Freq: Once | ORAL | 0 refills | Status: DC

## 2020-05-17 NOTE — Progress Notes (Signed)
Cardiology Office Note:    Date:  05/17/2020   ID:  Vanessa Stewart, DOB 08/12/1945, MRN [id number]  PCP:  Mayra Neer, MD   [place]  Cardiologist:  Candee Furbish, MD  Advanced Practice Provider:  No care team member to display Electrophysiologist:  None       Referring MD: Mayra Neer, MD     History of Present Illness:    Vanessa Stewart is a 75 y.o. female for evaluation of chest pain at the request of Dr.Shaw.  In review of her note from clinic on 03/15/2020 she has a history of Prinzmetal's angina but has not used nitroglycerin in the year.  She was having 4 days of belching bloating and mild nausea.  She was exercising without issue then developed left-sided jaw pain while sitting and talking to her sister.  She took some Pepcid and Tums and this seemed to help.  She had more jaw pain however after waking up the next day.  Possible reflux or angina.  EKG was unchanged.  She is on aspirin.  Right bundle branch block chronic.  She did have an echocardiogram in 2020 that showed mild LVH no mitral valve prolapse.  Mother died 56 brain aneurysm Brother and Sisiter died heart attacks  Past Medical History:  Diagnosis Date  . Allergy   . Carotid artery stenosis 07/03/2015   1-39% bilateral by  dopplers 07/2015  . Depression   . Essential tremor   . Hyperlipidemia   . Hypertension   . Hypothyroidism   . Osteopenia   . Prinzmetal's angina (HCC)    normal coronary arteries with coronary artery vasospasm at time of cath  . RBBB    Chronic    Past Surgical History:  Procedure Laterality Date  . CARDIAC CATHETERIZATION  12/09   coronary spasm noted on heart cath. No coronary artery disease. LV function normal  . colonoscopy  07/07   ganem- 10 Years  . KNEE SURGERY    . THYROIDECTOMY    . TOTAL ABDOMINAL HYSTERECTOMY W/ BILATERAL SALPINGOOPHORECTOMY      Current Medications: Current Meds  Medication Sig  . ALPRAZolam (XANAX) 0.5 MG  tablet Take 0.5 mg by mouth 2 (two) times daily as needed. (anxiety)  . aspirin 81 MG tablet Take 81 mg by mouth daily.  [name] CALCIUM CARBONATE PO Take by mouth.  . diltiazem (CARDIZEM CD) 180 MG 24 hr capsule Take 180 mg by mouth daily.  [name] levothyroxine (SYNTHROID, LEVOTHROID) 50 MCG tablet Take 50 mcg by mouth daily before breakfast.  . lisinopril-hydrochlorothiazide (PRINZIDE,ZESTORETIC) 20-25 MG per tablet Take 1 tablet by mouth daily.  . metoprolol tartrate (LOPRESSOR) 100 MG tablet Take 1 tablet (100 mg total) by mouth once for 1 dose. Take 1 tablet 2 hours before your CT  . Omega-3 Fatty Acids (FISH OIL) 1000 MG CAPS Take 1 capsule by mouth daily.  [name] PARoxetine (PAXIL) 20 MG tablet Take 20 mg by mouth daily.  . potassium chloride SA (K-DUR,KLOR-CON) 20 MEQ tablet Take 1 tablet (20 mEq total) by mouth 2 (two) times daily. Please make overdue appt with Dr. Radford Pax before anymore refills. 1st attempt  . rosuvastatin (CRESTOR) 5 MG tablet Take 5 mg by mouth daily.     Allergies:   Elemental sulfur, Fosamax [alendronate], Lipitor [atorvastatin], Pravastatin, Septra [sulfamethoxazole-trimethoprim], Sulfa antibiotics, and Zetia [ezetimibe]   Social History   Socioeconomic History  . Marital status: Widowed    Spouse name: Not on file  .  Number of children: Not on file  . Years of education: Not on file  . Highest education level: Not on file  Occupational History  . Not on file  Tobacco Use  . Smoking status: Never Smoker  . Smokeless tobacco: Never Used  Vaping Use  . Vaping Use: Never used  Substance and Sexual Activity  . Alcohol use: No    Alcohol/week: 0.0 standard drinks  . Drug use: No  . Sexual activity: Not on file  Other Topics Concern  . Not on file  Social History Narrative  . Not on file   Social Determinants of Health   Financial Resource Strain: Not on file  Food Insecurity: Not on file  Transportation Needs: Not on file  Physical Activity: Not on file   Stress: Not on file  Social Connections: Not on file     Family History: The patient's family history includes Heart disease in her brother, brother, mother, and sister; Stroke in her father.  ROS:   Please see the history of present illness.     All other systems reviewed and are negative.  EKGs/Labs/Other Studies Reviewed:    The following studies were reviewed today:  Carotids 2022:  Summary:  Right Carotid: Velocities in the right ICA are consistent with a 1-39%  stenosis.   Left Carotid: Velocities in the left ICA are consistent with a 40-59%  stenosis ,        distal segment, due to tortuosity.   EKG:  EKG is  ordered today.  The ekg ordered today demonstrates as below  Recent Labs: No results found for requested labs within last 8760 hours.  Recent Lipid Panel    Component Value Date/Time   CHOL 148 11/25/2015 1053   TRIG 95 11/25/2015 1053   HDL 68 11/25/2015 1053   CHOLHDL 2.2 11/25/2015 1053   VLDL 19 11/25/2015 1053   LDLCALC 61 11/25/2015 1053   LDLDIRECT 114.0 07/03/2014 1046     Risk Assessment/Calculations:      Physical Exam:    VS:  BP 140/80 (BP Location: Left Arm, Patient Position: Sitting, Cuff Size: Normal)   Pulse 73   Ht 5' 2.5" (1.588 m)   Wt 149 lb (67.6 kg)   SpO2 96%   BMI 26.82 kg/m     Wt Readings from Last 3 Encounters:  05/17/20 149 lb (67.6 kg)  04/24/20 150 lb 11.2 oz (68.4 kg)  02/14/19 151 lb 9.6 oz (68.8 kg)     GEN:  Well nourished, well developed in no acute distress HEENT: Normal NECK: No JVD; No carotid bruits LYMPHATICS: No lymphadenopathy CARDIAC: RRR, no murmurs, rubs, gallops RESPIRATORY:  Clear to auscultation without rales, wheezing or rhonchi  ABDOMEN: Soft, non-tender, non-distended MUSCULOSKELETAL:  No edema; No deformity  SKIN: Warm and dry NEUROLOGIC:  Alert and oriented x 3 PSYCHIATRIC:  Normal affect   ASSESSMENT:    1. Prinzmetal angina ([place])   2. Precordial pain   3.  Bilateral carotid artery stenosis   4. RBBB   5. Pre-procedure lab exam    PLAN:    In order of problems listed above:  Chest discomfort -Hopefully this was heartburn or GERD or gas related however given her age, abnormal EKG with right bundle branch block and T wave inversions noted in the 3 and V4, carotid artery plaque, family history with her brother and sister having myocardial infarction, carotid plaque moderate on the left, I would like to pursue coronary CT scan for further  evaluation.  Carotid artery plaque -Moderate on left mild on the right.  Continue with Crestor 10 mg.  She is had trouble with other statins giving myalgias.  Coronary vasospasm/Prinzmetal's angina -Previously diagnosed about 10 years ago after heart catheterization.       Medication Adjustments/Labs and Tests Ordered: Current medicines are reviewed at length with the patient today.  Concerns regarding medicines are outlined above.  Orders Placed This Encounter  Procedures  . CT CORONARY MORPH W/CTA COR W/SCORE W/CA W/CM &/OR WO/CM  . CT CORONARY FRACTIONAL FLOW RESERVE DATA PREP  . CT CORONARY FRACTIONAL FLOW RESERVE FLUID ANALYSIS  . Basic metabolic panel  . EKG 12-Lead   Meds ordered this encounter  Medications  . metoprolol tartrate (LOPRESSOR) 100 MG tablet    Sig: Take 1 tablet (100 mg total) by mouth once for 1 dose. Take 1 tablet 2 hours before your CT    Dispense:  1 tablet    Refill:  0    Patient Instructions  Medication Instructions:  The current medical regimen is effective;  continue present plan and medications.  *If you need a refill on your cardiac medications before your next appointment, please call your pharmacy*  You will need lab work a few days before your CT scan. (BMP)  Testing/Procedures: Your cardiac CT will be scheduled at:   [facility] [address] [place], [place] [address] [phone]  Please arrive at the [facility] main entrance  (entrance A) of [facility] 30 minutes prior to test start time. Proceed to the [facility] Radiology Department (first floor) to check-in and test prep.  Please follow these instructions carefully (unless otherwise directed):  On the Night Before the Test: . Be sure to Drink plenty of water. . Do not consume any caffeinated/decaffeinated beverages or chocolate 12 hours prior to your test. . Do not take any antihistamines 12 hours prior to your test.  On the Day of the Test: . Drink plenty of water until 1 hour prior to the test. . Do not eat any food 4 hours prior to the test. . You may take your regular medications prior to the test.  . Take metoprolol (Lopressor) two hours prior to test. . HOLD Furosemide/Hydrochlorothiazide morning of the test. . FEMALES- please wear underwire-free bra if available      After the Test: . Drink plenty of water. . After receiving IV contrast, you may experience a mild flushed feeling. This is normal. . On occasion, you may experience a mild rash up to 24 hours after the test. This is not dangerous. If this occurs, you can take Benadryl 25 mg and increase your fluid intake. . If you experience trouble breathing, this can be serious. If it is severe call 911 IMMEDIATELY. If it is mild, please call our office.  Once we have confirmed authorization from your insurance company, we will call you to set up a date and time for your test. Based on how quickly your insurance processes prior authorizations requests, please allow up to 4 weeks to be contacted for scheduling your Cardiac CT appointment. Be advised that routine Cardiac CT appointments could be scheduled as many as 8 weeks after your provider has ordered it.  For non-scheduling related questions, please contact the cardiac imaging nurse navigator should you have any questions/concerns: Marchia Bond, Cardiac Imaging Nurse Navigator Gordy Clement, Cardiac Imaging Nurse Navigator [place]  [facility] Direct Office Dial: [phone]   For scheduling  needs, including cancellations and rescheduling, please call [name], [phone].  Follow-Up: At [facility], you and your health needs are our priority.  As part of our continuing mission to provide you with exceptional heart care, we have created designated Provider Care Teams.  These Care Teams include your primary Cardiologist (physician) and Advanced Practice Providers (APPs -  Physician Assistants and Nurse Practitioners) who all work together to provide you with the care you need, when you need it.  We recommend signing up for the patient portal called "MyChart".  Sign up information is provided on this After Visit Summary.  MyChart is used to connect with patients for Virtual Visits (Telemedicine).  Patients are able to view lab/test results, encounter notes, upcoming appointments, etc.  Non-urgent messages can be sent to your provider as well.   To learn more about what you can do with MyChart, go to [email or web address].    Your next appointment:   Follow up to be determined after the above testing.  Thank you for choosing [facility]!!        Signed, Candee Furbish, MD  05/17/2020 12:39 PM    [place]

## 2020-05-17 NOTE — Patient Instructions (Signed)
Medication Instructions:  The current medical regimen is effective;  continue present plan and medications.  *If you need a refill on your cardiac medications before your next appointment, please call your pharmacy*  You will need lab work a few days before your CT scan. (BMP)  Testing/Procedures: Your cardiac CT will be scheduled at:   [facility] [address] [place], [place] [address] [phone]  Please arrive at the [facility] main entrance (entrance A) of [facility] 30 minutes prior to test start time. Proceed to the [facility] Radiology Department (first floor) to check-in and test prep.  Please follow these instructions carefully (unless otherwise directed):  On the Night Before the Test: . Be sure to Drink plenty of water. . Do not consume any caffeinated/decaffeinated beverages or chocolate 12 hours prior to your test. . Do not take any antihistamines 12 hours prior to your test.  On the Day of the Test: . Drink plenty of water until 1 hour prior to the test. . Do not eat any food 4 hours prior to the test. . You may take your regular medications prior to the test.  . Take metoprolol (Lopressor) two hours prior to test. . HOLD Furosemide/Hydrochlorothiazide morning of the test. . FEMALES- please wear underwire-free bra if available      After the Test: . Drink plenty of water. . After receiving IV contrast, you may experience a mild flushed feeling. This is normal. . On occasion, you may experience a mild rash up to 24 hours after the test. This is not dangerous. If this occurs, you can take Benadryl 25 mg and increase your fluid intake. . If you experience trouble breathing, this can be serious. If it is severe call 911 IMMEDIATELY. If it is mild, please call our office.  Once we have confirmed authorization from your insurance company, we will call you to set up a date and time for your test. Based on how quickly your insurance processes  prior authorizations requests, please allow up to 4 weeks to be contacted for scheduling your Cardiac CT appointment. Be advised that routine Cardiac CT appointments could be scheduled as many as 8 weeks after your provider has ordered it.  For non-scheduling related questions, please contact the cardiac imaging nurse navigator should you have any questions/concerns: Marchia Bond, Cardiac Imaging Nurse Navigator Gordy Clement, Cardiac Imaging Nurse Navigator [place] [facility] Direct Office Dial: [phone]   For scheduling needs, including cancellations and rescheduling, please call [name], [phone].  Follow-Up: At [facility], you and your health needs are our priority.  As part of our continuing mission to provide you with exceptional heart care, we have created designated Provider Care Teams.  These Care Teams include your primary Cardiologist (physician) and Advanced Practice Providers (APPs -  Physician Assistants and Nurse Practitioners) who all work together to provide you with the care you need, when you need it.  We recommend signing up for the patient portal called "MyChart".  Sign up information is provided on this After Visit Summary.  MyChart is used to connect with patients for Virtual Visits (Telemedicine).  Patients are able to view lab/test results, encounter notes, upcoming appointments, etc.  Non-urgent messages can be sent to your provider as well.   To learn more about what you can do with MyChart, go to [email or web address].    Your next appointment:   Follow up to be determined after the above testing.  Thank you for choosing  [place]!!

## 2020-06-07 DIAGNOSIS — Z1231 Encounter for screening mammogram for malignant neoplasm of breast: Secondary | ICD-10-CM | POA: Diagnosis not present

## 2020-06-25 ENCOUNTER — Other Ambulatory Visit: Payer: Self-pay | Admitting: Family Medicine

## 2020-06-25 ENCOUNTER — Ambulatory Visit
Admission: RE | Admit: 2020-06-25 | Discharge: 2020-06-25 | Disposition: A | Discharge status: Home / Self Care | Payer: Medicare HMO | Source: Ambulatory Visit | Attending: Family Medicine | Admitting: Family Medicine

## 2020-06-25 DIAGNOSIS — M79672 Pain in left foot: Secondary | ICD-10-CM

## 2020-06-25 DIAGNOSIS — Z0000 Encounter for general adult medical examination without abnormal findings: Secondary | ICD-10-CM | POA: Diagnosis not present

## 2020-06-25 DIAGNOSIS — M19072 Primary osteoarthritis, left ankle and foot: Secondary | ICD-10-CM | POA: Diagnosis not present

## 2020-06-25 DIAGNOSIS — E782 Mixed hyperlipidemia: Secondary | ICD-10-CM | POA: Diagnosis not present

## 2020-06-25 DIAGNOSIS — R69 Illness, unspecified: Secondary | ICD-10-CM | POA: Diagnosis not present

## 2020-06-25 DIAGNOSIS — I4510 Unspecified right bundle-branch block: Secondary | ICD-10-CM | POA: Diagnosis not present

## 2020-06-25 DIAGNOSIS — I201 Angina pectoris with documented spasm: Secondary | ICD-10-CM | POA: Diagnosis not present

## 2020-06-25 DIAGNOSIS — R7303 Prediabetes: Secondary | ICD-10-CM | POA: Diagnosis not present

## 2020-06-25 DIAGNOSIS — H6090 Unspecified otitis externa, unspecified ear: Secondary | ICD-10-CM | POA: Diagnosis not present

## 2020-06-25 DIAGNOSIS — E039 Hypothyroidism, unspecified: Secondary | ICD-10-CM | POA: Diagnosis not present

## 2020-06-25 DIAGNOSIS — I6529 Occlusion and stenosis of unspecified carotid artery: Secondary | ICD-10-CM | POA: Diagnosis not present

## 2020-06-25 DIAGNOSIS — I10 Essential (primary) hypertension: Secondary | ICD-10-CM | POA: Diagnosis not present

## 2020-06-25 DIAGNOSIS — M810 Age-related osteoporosis without current pathological fracture: Secondary | ICD-10-CM | POA: Diagnosis not present

## 2020-07-15 ENCOUNTER — Telehealth: Payer: Self-pay | Admitting: Cardiology

## 2020-07-15 ENCOUNTER — Other Ambulatory Visit: Payer: Self-pay

## 2020-07-15 ENCOUNTER — Other Ambulatory Visit: Payer: Medicare HMO | Admitting: *Deleted

## 2020-07-15 DIAGNOSIS — Z01812 Encounter for preprocedural laboratory examination: Secondary | ICD-10-CM | POA: Diagnosis not present

## 2020-07-15 DIAGNOSIS — R072 Precordial pain: Secondary | ICD-10-CM | POA: Diagnosis not present

## 2020-07-15 DIAGNOSIS — I201 Angina pectoris with documented spasm: Secondary | ICD-10-CM | POA: Diagnosis not present

## 2020-07-15 LAB — BASIC METABOLIC PANEL
BUN/Creatinine Ratio: 24 (ref 12–28)
BUN: 10 mg/dL (ref 8–27)
CO2: 24 mmol/L (ref 20–29)
Calcium: 9.1 mg/dL (ref 8.7–10.3)
Chloride: 99 mmol/L (ref 96–106)
Creatinine, Ser: 0.42 mg/dL — ABNORMAL LOW (ref 0.57–1.00)
Glucose: 101 mg/dL — ABNORMAL HIGH (ref 65–99)
Potassium: 3.6 mmol/L (ref 3.5–5.2)
Sodium: 140 mmol/L (ref 134–144)
eGFR: 103 mL/min/{1.73_m2} (ref >59)

## 2020-07-15 NOTE — Telephone Encounter (Signed)
Attempted to contact pt to discuss her concern r/t medication.  Call rang once and went to VM.  Left message to call back to discuss her concerns or she could send a pt advise request if she prefers.[name]

## 2020-07-15 NOTE — Telephone Encounter (Signed)
PT IS WANTING TO KNOW IF CARDIAC MEDICINE FOR HER HEART, EDU PT THAT I CANNOT GIVE MEDICAL ADVICE, PLEASE FOLLOW UP W/ PT TO ADVISE.

## 2020-07-16 ENCOUNTER — Telehealth (HOSPITAL_COMMUNITY): Payer: Self-pay | Admitting: Emergency Medicine

## 2020-07-16 NOTE — Telephone Encounter (Signed)
Reaching out to patient to offer assistance regarding upcoming cardiac imaging study; pt verbalizes understanding of appt date/time, parking situation and where to check in, pre-test NPO status and medications ordered, and verified current allergies; name and call back number provided for further questions should they arise Jovi Zavadil RN Navigator Cardiac Imaging [place] [facility] [phone] office [phone] cell   100mg metoprolol tartrate  

## 2020-07-16 NOTE — Telephone Encounter (Signed)
Pt called back in returning Pam's call   Best number [phone]

## 2020-07-16 NOTE — Telephone Encounter (Signed)
Attempted to contact pt to discuss her concerns - na - lm to c/b to discuss.

## 2020-07-17 ENCOUNTER — Other Ambulatory Visit: Payer: Self-pay

## 2020-07-17 ENCOUNTER — Ambulatory Visit (HOSPITAL_COMMUNITY)
Admission: RE | Admit: 2020-07-17 | Discharge: 2020-07-17 | Disposition: A | Discharge status: Home / Self Care | Payer: Medicare HMO | Source: Ambulatory Visit | Attending: Cardiology | Admitting: Cardiology

## 2020-07-17 ENCOUNTER — Other Ambulatory Visit: Payer: Self-pay | Admitting: Orthopedic Surgery

## 2020-07-17 DIAGNOSIS — R072 Precordial pain: Secondary | ICD-10-CM

## 2020-07-17 DIAGNOSIS — I4510 Unspecified right bundle-branch block: Secondary | ICD-10-CM | POA: Insufficient documentation

## 2020-07-17 DIAGNOSIS — I2510 Atherosclerotic heart disease of native coronary artery without angina pectoris: Secondary | ICD-10-CM | POA: Diagnosis not present

## 2020-07-17 MED ORDER — IOHEXOL 350 MG/ML SOLN
100.0000 mL | Freq: Once | INTRAVENOUS | Status: AC | PRN
  Administered 2020-07-17: 100 mL via INTRAVENOUS

## 2020-07-17 MED ORDER — NITROGLYCERIN 0.4 MG SL SUBL: 0.8000 mg | SUBLINGUAL_TABLET | Freq: Once | SUBLINGUAL | Status: DC

## 2020-07-17 MED ORDER — NITROGLYCERIN 0.4 MG SL SUBL
SUBLINGUAL_TABLET | SUBLINGUAL | Status: AC
  Administered 2020-07-17: 0.8 mg
  Filled 2020-07-17: qty 2

## 2020-07-18 ENCOUNTER — Ambulatory Visit (HOSPITAL_COMMUNITY)
Admission: RE | Admit: 2020-07-18 | Discharge: 2020-07-18 | Disposition: A | Discharge status: Home / Self Care | Payer: Medicare HMO | Source: Ambulatory Visit | Attending: Cardiology | Admitting: Cardiology

## 2020-07-18 ENCOUNTER — Ambulatory Visit: Payer: Medicare HMO | Admitting: Cardiology

## 2020-07-18 ENCOUNTER — Telehealth: Payer: Self-pay | Admitting: Cardiology

## 2020-07-18 ENCOUNTER — Encounter: Payer: Self-pay | Admitting: Cardiology

## 2020-07-18 VITALS — BP 134/80 | HR 62 | Ht 62.5 in | Wt 149.0 lb

## 2020-07-18 DIAGNOSIS — Z01818 Encounter for other preprocedural examination: Secondary | ICD-10-CM

## 2020-07-18 DIAGNOSIS — Z01812 Encounter for preprocedural laboratory examination: Secondary | ICD-10-CM

## 2020-07-18 DIAGNOSIS — I4510 Unspecified right bundle-branch block: Secondary | ICD-10-CM

## 2020-07-18 DIAGNOSIS — R040 Epistaxis: Secondary | ICD-10-CM | POA: Insufficient documentation

## 2020-07-18 DIAGNOSIS — R072 Precordial pain: Secondary | ICD-10-CM | POA: Diagnosis not present

## 2020-07-18 DIAGNOSIS — R931 Abnormal findings on diagnostic imaging of heart and coronary circulation: Secondary | ICD-10-CM

## 2020-07-18 DIAGNOSIS — I2510 Atherosclerotic heart disease of native coronary artery without angina pectoris: Secondary | ICD-10-CM

## 2020-07-18 DIAGNOSIS — G44219 Episodic tension-type headache, not intractable: Secondary | ICD-10-CM | POA: Insufficient documentation

## 2020-07-18 NOTE — Progress Notes (Addendum)
Cardiology Office Note:    Date:  07/18/2020   ID:  Vanessa Stewart, DOB 05-08-1945, MRN [id number]  PCP:  Mayra Neer, MD   [facility] Providers Cardiologist:  Candee Furbish, MD     Referring MD: Mayra Neer, MD   History of Present Illness:    Vanessa Stewart is a 75 y.o. female is here to follow-up to discuss heart catheterization.  Previous Visit:  Office visit for valuation of chest pain at the request of Dr.Shaw.  In review of her note from clinic on 03/15/2020 she has a history of Prinzmetal's angina but has not used nitroglycerin in the year.  She was having 4 days of belching bloating and mild nausea.  She was exercising without issue then developed left-sided jaw pain while sitting and talking to her sister.  She took some Pepcid and Tums and this seemed to help.  She had more jaw pain however after waking up the next day.  Possible reflux or angina.  EKG was unchanged. She is on aspirin.   Right bundle branch block chronic.  She did have an echocardiogram in 2020 that showed mild LVH no mitral valve prolapse.   Mother died 74 brain aneurysm Brother and Sisiter died heart attacks  Today, she is doing moderately well. From her last visit she reports her jaw pain has resolved.   She denies exertional shortness of breath, chest pain, palpitations, tightness or pressure. She denies having PND, orthopnea, dizziness nor syncopal episodes.     Past Medical History:  Diagnosis Date   Allergy    Carotid artery stenosis 07/03/2015   1-39% bilateral by  dopplers 07/2015   Depression    Essential tremor    Hyperlipidemia    Hypertension    Hypothyroidism    Osteopenia    Prinzmetal's angina (HCC)    normal coronary arteries with coronary artery vasospasm at time of cath   RBBB    Chronic    Past Surgical History:  Procedure Laterality Date   CARDIAC CATHETERIZATION  12/09   coronary spasm noted on heart cath. No coronary artery disease. LV function normal    colonoscopy  07/07   ganem- 10 Years   KNEE SURGERY     THYROIDECTOMY     TOTAL ABDOMINAL HYSTERECTOMY W/ BILATERAL SALPINGOOPHORECTOMY      Current Medications: Current Meds  Medication Sig   alendronate (FOSAMAX) 70 MG tablet Take 70 mg by mouth daily.   ALPRAZolam (XANAX) 0.5 MG tablet Take 0.5 mg by mouth 2 (two) times daily as needed. (anxiety)   aspirin 81 MG tablet Take 81 mg by mouth daily.   CALCIUM CARBONATE PO Take by mouth.   diltiazem (CARDIZEM CD) 180 MG 24 hr capsule Take 180 mg by mouth daily.   levothyroxine (SYNTHROID, LEVOTHROID) 50 MCG tablet Take 50 mcg by mouth daily before breakfast.   lisinopril-hydrochlorothiazide (PRINZIDE,ZESTORETIC) 20-25 MG per tablet Take 1 tablet by mouth daily.   Omega-3 Fatty Acids (FISH OIL) 1000 MG CAPS Take 1 capsule by mouth daily.   PARoxetine (PAXIL) 20 MG tablet Take 20 mg by mouth daily.   potassium chloride SA (K-DUR,KLOR-CON) 20 MEQ tablet Take 1 tablet (20 mEq total) by mouth 2 (two) times daily. Please make overdue appt with Dr. Radford Pax before anymore refills. 1st attempt   rosuvastatin (CRESTOR) 5 MG tablet Take 5 mg by mouth daily.     Allergies:   Elemental sulfur, Fosamax [alendronate], Lipitor [atorvastatin], Pravastatin, Septra [sulfamethoxazole-trimethoprim], Sulfa antibiotics, and  Zetia [ezetimibe]   Social History   Socioeconomic History   Marital status: Widowed    Spouse name: Not on file   Number of children: Not on file   Years of education: Not on file   Highest education level: Not on file  Occupational History   Not on file  Tobacco Use   Smoking status: Never   Smokeless tobacco: Never  Vaping Use   Vaping Use: Never used  Substance and Sexual Activity   Alcohol use: No    Alcohol/week: 0.0 standard drinks   Drug use: No   Sexual activity: Not on file  Other Topics Concern   Not on file  Social History Narrative   Not on file   Social Determinants of Health   Financial Resource Strain:  Not on file  Food Insecurity: Not on file  Transportation Needs: Not on file  Physical Activity: Not on file  Stress: Not on file  Social Connections: Not on file     Family History: The patient's family history includes Heart disease in her brother, brother, mother, and sister; Stroke in her father.  ROS:   Please see the history of present illness. All other systems reviewed and are negative.  EKGs/Labs/Other Studies Reviewed:    The following studies were reviewed today:  Vas US carotid Duplex Bilateral  04/24/2020 Summary:  Right Carotid: Velocities in the right ICA are consistent with a 1-39%  stenosis.   Left Carotid: Velocities in the left ICA are consistent with a 40-59%  stenosis ,                distal segment, due to tortuosity.   Echo 02/01/2019 IMPRESSIONS   1. Left ventricular ejection fraction, by visual estimation, is 60 to  65%. The left ventricle has normal function. There is mildly increased  left ventricular hypertrophy.   2. Left ventricular diastolic parameters are indeterminate.   3. The left ventricle has no regional wall motion abnormalities.   4. Global right ventricle has normal systolic function.The right  ventricular size is normal. No increase in right ventricular wall  thickness.   5. Left atrial size was normal.   6. Right atrial size was normal.   7. The mitral valve is normal in structure. Trivial mitral valve  regurgitation. No evidence of mitral stenosis.   8. No significant mitral valve prolapse appreciated.   9. The tricuspid valve is normal in structure.  10. The aortic valve is tricuspid. Aortic valve regurgitation is not  visualized. Mild aortic valve sclerosis without stenosis.  11. The pulmonic valve was normal in structure. Pulmonic valve  regurgitation is trivial.  12. Mildly elevated pulmonary artery systolic pressure.  13. The inferior vena cava is normal in size with greater than 50%  respiratory variability, suggesting  right atrial pressure of 3 mmHg.  CT Coronary  07/17/2020 IMPRESSION: 1. Coronary artery calicum score 46 Agatston units. This places the patient in the 51st percentile for age and gender, suggesting intermediate risk for future cardiac events.   2. Discrete calcified mid LAD stenosis, hemodynamically significant by FFR (0.57).  DG Chest  03/16/2020 IMPRESSION: No acute cardiopulmonary abnormality.  EKG:  EKG showed sinus rhythm, 62 bpm, right bundle branch (RBB), T-wave inversion interior and anterior leads  Recent Labs: 07/15/2020: BUN 10; Creatinine, Ser 0.42; Potassium 3.6; Sodium 140  Recent Lipid Panel    Component Value Date/Time   CHOL 148 11/25/2015 1053   TRIG 95 11/25/2015 1053   HDL 68  11/25/2015 1053   CHOLHDL 2.2 11/25/2015 1053   VLDL 19 11/25/2015 1053   LDLCALC 61 11/25/2015 1053   LDLDIRECT 114.0 07/03/2014 1046     Risk Assessment/Calculations:      Physical Exam:    VS:  BP 134/80 (BP Location: Left Arm, Patient Position: Sitting, Cuff Size: Normal)   Pulse 62   Ht 5' 2.5" (1.588 m)   Wt 149 lb (67.6 kg)   BMI 26.82 kg/m     Wt Readings from Last 3 Encounters:  07/18/20 149 lb (67.6 kg)  05/17/20 149 lb (67.6 kg)  04/24/20 150 lb 11.2 oz (68.4 kg)     OIZ:TIWP nourished, well developed in no acute distress HEENT: Normal NECK: No JVD; No carotid bruits LYMPHATICS: No lymphadenopathy CARDIAC: RRR, no murmurs, rubs, gallops RESPIRATORY:  Clear to auscultation without rales, wheezing or rhonchi  ABDOMEN: Soft, non-tender, non-distended MUSCULOSKELETAL:  No edema; No deformity  SKIN: Warm and dry NEUROLOGIC:  Alert and oriented x 3 PSYCHIATRIC:  Normal affect   ASSESSMENT:    1. Coronary artery disease involving native coronary artery of native heart without angina pectoris   2. Pre-procedure lab exam   3. Elevated coronary artery calcium score    PLAN:    In order of problems listed above:  Chest Discomfort/abnormal coronary CT  scan: CT scan concerning for mid LAD lesion with positive CT FFR. - We will go ahead and refer for left heart catheterization with possible percutaneous intervention.  Risks and benefits have been explained.  Including stroke heart attack death renal impairment bleeding.  Her daughter was present for discussion  Carotid artery plaque Previously moderate on left mild on the right.  Continue with Crestor 10 mg.  Hyperlipidemia - Crestor 10 mg once a day.  Refills for medical management as needed.  No myalgias currently.  When she went on higher doses or other statins this caused myalgias.  Coronary vasospasm/Prinzmetal's angina  -This was previously diagnosed about 10 years ago after prior heart catheterization.  Right bundle branch block - Chronic.  Prior echocardiogram in 2020 showed mild LVH but no mitral valve prolapse  Medication Adjustments/Labs and Tests Ordered: Current medicines are reviewed at length with the patient today.  Concerns regarding medicines are outlined above.  Orders Placed This Encounter  Procedures   CBC   Basic metabolic panel   EKG 80-DXIP   No orders of the defined types were placed in this encounter.  Time spent: 43 minutes spent reviewing medical records, discussing cardiac catheterization with patient, reviewing CT scan report  I, Candee Furbish, MD, have reviewed all documentation for this visit. The documentation on 07/18/20 for the exam, diagnosis, procedures, and orders are all accurate and complete.   Patient Instructions  Medication Instructions:  The current medical regimen is effective;  continue present plan and medications.  *If you need a refill on your cardiac medications before your next appointment, please call your pharmacy*  Lab Work: Please have blood work as scheduled on Tuesday(CBC,BMP)  If you have labs (blood work) drawn today and your tests are completely normal, you will receive your results only by: [place] (if you  have MyChart) OR A paper copy in the mail If you have any lab test that is abnormal or we need to change your treatment, we will call you to review the results.   Testing/Procedures:   [address] OFFICE [place], [address] [place]  [place] [address] Dept: [phone] Loc: [place]  07/18/2020  You are scheduled for a Cardiac Catheterization on Friday, June 17 with Dr. Glenetta Hew.  1. Please arrive at the [facility] (Main Entrance A) at [facility]: [address] [place], [place] [address] at 5:30 AM (two hours before your procedure to ensure your preparation). Free valet parking service is available.   Special note: Every effort is made to have your procedure done on time. Please understand that emergencies sometimes delay scheduled procedures.  2. Diet: Do not eat or drink anything after midnight prior to your procedure except sips of water to take medications.  3. Labs: You will need to have blood drawn on Tuesday, June 14 at [facility] at [facility]. [address]  Open: 7:30am - 5pm    Phone: [phone]. You do not need to be fasting.  4. Medication instructions in preparation for your procedure:  On the morning of your procedure, take your Aspirin and any morning medicines NOT listed above.  You may use sips of water.  5. Plan for one night stay--bring personal belongings. 6. Bring a current list of your medications and current insurance cards. 7. You MUST have a responsible person to drive you home. 8. Someone MUST be with you the first 24 hours after you arrive home or your discharge will be delayed. 9. Please wear clothes that are easy to get on and off and wear slip-on shoes.  Thank you for allowing [place] to care for you!   -- [place] [facility]   Follow-Up: At [facility], you and your health  needs are our priority.  As part of our continuing mission to provide you with exceptional heart care, we have created designated Provider Care Teams.  These Care Teams include your primary Cardiologist (physician) and Advanced Practice Providers (APPs -  Physician Assistants and Nurse Practitioners) who all work together to provide you with the care you need, when you need it.  We recommend signing up for the patient portal called "MyChart".  Sign up information is provided on this After Visit Summary.  MyChart is used to connect with patients for Virtual Visits (Telemedicine).  Patients are able to view lab/test results, encounter notes, upcoming appointments, etc.  Non-urgent messages can be sent to your provider as well.   To learn more about what you can do with MyChart, go to [email or web address].    Your next appointment:   6 month(s)  The format for your next appointment:   In Person  Provider:   Candee Furbish, MD   Thank you for choosing [facility]!!      I,Essence Turner,acting as a scribe for Candee Furbish, MD.,have documented all relevant documentation on the behalf of Candee Furbish, MD,as directed by  Candee Furbish, MD while in the presence of Candee Furbish, MD.   Signed, Candee Furbish, MD  07/18/2020 10:50 AM    [place]

## 2020-07-18 NOTE — Telephone Encounter (Signed)
Discussed coming into the office to speak with Dr. Marlou Porch. She has agreed and will be in today.

## 2020-07-18 NOTE — Telephone Encounter (Signed)
Patient is requesting to speak with Dr. Marlou Porch' nurse regarding cardiac cath.

## 2020-07-18 NOTE — Patient Instructions (Signed)
Medication Instructions:  The current medical regimen is effective;  continue present plan and medications.  *If you need a refill on your cardiac medications before your next appointment, please call your pharmacy*  Lab Work: Please have blood work as scheduled on Tuesday(CBC,BMP)  If you have labs (blood work) drawn today and your tests are completely normal, you will receive your results only by: [place] (if you have MyChart) OR A paper copy in the mail If you have any lab test that is abnormal or we need to change your treatment, we will call you to review the results.   Testing/Procedures:   [facility] OFFICE [place], [place] [address] Dept: [phone] Loc: [place]  07/18/2020  You are scheduled for a Cardiac Catheterization on Friday, June 17 with Dr. Glenetta Hew.  1. Please arrive at the [facility] (Main Entrance A) at [facility]: [address] [place], [place] [address] at 5:30 AM (two hours before your procedure to ensure your preparation). Free valet parking service is available.   Special note: Every effort is made to have your procedure done on time. Please understand that emergencies sometimes delay scheduled procedures.  2. Diet: Do not eat or drink anything after midnight prior to your procedure except sips of water to take medications.  3. Labs: You will need to have blood drawn on Tuesday, June 14 at [facility] at [facility]. [address]  Open: 7:30am - 5pm    Phone: [phone]. You do not need to be fasting.  4. Medication instructions in preparation for your procedure:  On the morning of your procedure, take your Aspirin and any morning medicines NOT listed above.  You may use sips of water.  5. Plan for one night stay--bring personal belongings. 6. Bring a current list of your  medications and current insurance cards. 7. You MUST have a responsible person to drive you home. 8. Someone MUST be with you the first 24 hours after you arrive home or your discharge will be delayed. 9. Please wear clothes that are easy to get on and off and wear slip-on shoes.  Thank you for allowing [place] to care for you!   -- [place] [facility]   Follow-Up: At [facility], you and your health needs are our priority.  As part of our continuing mission to provide you with exceptional heart care, we have created designated Provider Care Teams.  These Care Teams include your primary Cardiologist (physician) and Advanced Practice Providers (APPs -  Physician Assistants and Nurse Practitioners) who all work together to provide you with the care you need, when you need it.  We recommend signing up for the patient portal called "MyChart".  Sign up information is provided on this After Visit Summary.  MyChart is used to connect with patients for Virtual Visits (Telemedicine).  Patients are able to view lab/test results, encounter notes, upcoming appointments, etc.  Non-urgent messages can be sent to your provider as well.   To learn more about what you can do with MyChart, go to [email or web address].    Your next appointment:   6 month(s)  The format for your next appointment:   In Person  Provider:   Candee Furbish, MD   Thank you for choosing [facility]!!

## 2020-07-18 NOTE — H&P (View-Only) (Signed)
Cardiology Office Note:    Date:  07/18/2020   ID:  Vanessa Stewart, DOB [birth year], MRN [id number]  PCP:  Mayra Neer, MD   [facility] Providers Cardiologist:  Candee Furbish, MD     Referring MD: Mayra Neer, MD   History of Present Illness:    Vanessa Stewart is a 75 y.o. female is here to follow-up to discuss heart catheterization.  Previous Visit:  Office visit for valuation of chest pain at the request of Dr.Shaw.  In review of her note from clinic on 03/15/2020 she has a history of Prinzmetal's angina but has not used nitroglycerin in the year.  She was having 4 days of belching bloating and mild nausea.  She was exercising without issue then developed left-sided jaw pain while sitting and talking to her sister.  She took some Pepcid and Tums and this seemed to help.  She had more jaw pain however after waking up the next day.  Possible reflux or angina.  EKG was unchanged. She is on aspirin.   Right bundle branch block chronic.  She did have an echocardiogram in 2020 that showed mild LVH no mitral valve prolapse.   Mother died 51 brain aneurysm Brother and Sisiter died heart attacks  Today, she is doing moderately well. From her last visit she reports her jaw pain has resolved.   She denies exertional shortness of breath, chest pain, palpitations, tightness or pressure. She denies having PND, orthopnea, dizziness nor syncopal episodes.     Past Medical History:  Diagnosis Date   Allergy    Carotid artery stenosis 07/03/2015   1-39% bilateral by  dopplers 07/2015   Depression    Essential tremor    Hyperlipidemia    Hypertension    Hypothyroidism    Osteopenia    Prinzmetal's angina (HCC)    normal coronary arteries with coronary artery vasospasm at time of cath   RBBB    Chronic    Past Surgical History:  Procedure Laterality Date   CARDIAC CATHETERIZATION  12/09   coronary spasm noted on heart cath. No coronary artery disease. LV function normal    colonoscopy  07/07   ganem- 10 Years   KNEE SURGERY     THYROIDECTOMY     TOTAL ABDOMINAL HYSTERECTOMY W/ BILATERAL SALPINGOOPHORECTOMY      Current Medications: Current Meds  Medication Sig   alendronate (FOSAMAX) 70 MG tablet Take 70 mg by mouth daily.   ALPRAZolam (XANAX) 0.5 MG tablet Take 0.5 mg by mouth 2 (two) times daily as needed. (anxiety)   aspirin 81 MG tablet Take 81 mg by mouth daily.   CALCIUM CARBONATE PO Take by mouth.   diltiazem (CARDIZEM CD) 180 MG 24 hr capsule Take 180 mg by mouth daily.   levothyroxine (SYNTHROID, LEVOTHROID) 50 MCG tablet Take 50 mcg by mouth daily before breakfast.   lisinopril-hydrochlorothiazide (PRINZIDE,ZESTORETIC) 20-25 MG per tablet Take 1 tablet by mouth daily.   Omega-3 Fatty Acids (FISH OIL) 1000 MG CAPS Take 1 capsule by mouth daily.   PARoxetine (PAXIL) 20 MG tablet Take 20 mg by mouth daily.   potassium chloride SA (K-DUR,KLOR-CON) 20 MEQ tablet Take 1 tablet (20 mEq total) by mouth 2 (two) times daily. Please make overdue appt with Dr. Radford Pax before anymore refills. 1st attempt   rosuvastatin (CRESTOR) 5 MG tablet Take 5 mg by mouth daily.     Allergies:   Elemental sulfur, Fosamax [alendronate], Lipitor [atorvastatin], Pravastatin, Septra [sulfamethoxazole-trimethoprim], Sulfa antibiotics, and  Zetia [ezetimibe]   Social History   Socioeconomic History   Marital status: Widowed    Spouse name: Not on file   Number of children: Not on file   Years of education: Not on file   Highest education level: Not on file  Occupational History   Not on file  Tobacco Use   Smoking status: Never   Smokeless tobacco: Never  Vaping Use   Vaping Use: Never used  Substance and Sexual Activity   Alcohol use: No    Alcohol/week: 0.0 standard drinks   Drug use: No   Sexual activity: Not on file  Other Topics Concern   Not on file  Social History Narrative   Not on file   Social Determinants of Health   Financial Resource Strain:  Not on file  Food Insecurity: Not on file  Transportation Needs: Not on file  Physical Activity: Not on file  Stress: Not on file  Social Connections: Not on file     Family History: The patient's family history includes Heart disease in her brother, brother, mother, and sister; Stroke in her father.  ROS:   Please see the history of present illness. All other systems reviewed and are negative.  EKGs/Labs/Other Studies Reviewed:    The following studies were reviewed today:  Vas US carotid Duplex Bilateral  04/24/2020 Summary:  Right Carotid: Velocities in the right ICA are consistent with a 1-39%  stenosis.   Left Carotid: Velocities in the left ICA are consistent with a 40-59%  stenosis ,                distal segment, due to tortuosity.   Echo 02/01/2019 IMPRESSIONS   1. Left ventricular ejection fraction, by visual estimation, is 60 to  65%. The left ventricle has normal function. There is mildly increased  left ventricular hypertrophy.   2. Left ventricular diastolic parameters are indeterminate.   3. The left ventricle has no regional wall motion abnormalities.   4. Global right ventricle has normal systolic function.The right  ventricular size is normal. No increase in right ventricular wall  thickness.   5. Left atrial size was normal.   6. Right atrial size was normal.   7. The mitral valve is normal in structure. Trivial mitral valve  regurgitation. No evidence of mitral stenosis.   8. No significant mitral valve prolapse appreciated.   9. The tricuspid valve is normal in structure.  10. The aortic valve is tricuspid. Aortic valve regurgitation is not  visualized. Mild aortic valve sclerosis without stenosis.  11. The pulmonic valve was normal in structure. Pulmonic valve  regurgitation is trivial.  12. Mildly elevated pulmonary artery systolic pressure.  13. The inferior vena cava is normal in size with greater than 50%  respiratory variability, suggesting  right atrial pressure of 3 mmHg.  CT Coronary  07/17/2020 IMPRESSION: 1. Coronary artery calicum score 46 Agatston units. This places the patient in the 51st percentile for age and gender, suggesting intermediate risk for future cardiac events.   2. Discrete calcified mid LAD stenosis, hemodynamically significant by FFR (0.57).  DG Chest  03/16/2020 IMPRESSION: No acute cardiopulmonary abnormality.  EKG:  EKG showed sinus rhythm, 62 bpm, right bundle branch (RBB), T-wave inversion interior and anterior leads  Recent Labs: 07/15/2020: BUN 10; Creatinine, Ser 0.42; Potassium 3.6; Sodium 140  Recent Lipid Panel    Component Value Date/Time   CHOL 148 11/25/2015 1053   TRIG 95 11/25/2015 1053   HDL 68  11/25/2015 1053   CHOLHDL 2.2 11/25/2015 1053   VLDL 19 11/25/2015 1053   LDLCALC 61 11/25/2015 1053   LDLDIRECT 114.0 07/03/2014 1046     Risk Assessment/Calculations:      Physical Exam:    VS:  BP 134/80 (BP Location: Left Arm, Patient Position: Sitting, Cuff Size: Normal)   Pulse 62   Ht 5' 2.5" (1.588 m)   Wt 149 lb (67.6 kg)   BMI 26.82 kg/m     Wt Readings from Last 3 Encounters:  07/18/20 149 lb (67.6 kg)  05/17/20 149 lb (67.6 kg)  04/24/20 150 lb 11.2 oz (68.4 kg)     JJO:ACZY nourished, well developed in no acute distress HEENT: Normal NECK: No JVD; No carotid bruits LYMPHATICS: No lymphadenopathy CARDIAC: RRR, no murmurs, rubs, gallops RESPIRATORY:  Clear to auscultation without rales, wheezing or rhonchi  ABDOMEN: Soft, non-tender, non-distended MUSCULOSKELETAL:  No edema; No deformity  SKIN: Warm and dry NEUROLOGIC:  Alert and oriented x 3 PSYCHIATRIC:  Normal affect   ASSESSMENT:    1. Coronary artery disease involving native coronary artery of native heart without angina pectoris   2. Pre-procedure lab exam   3. Elevated coronary artery calcium score    PLAN:    In order of problems listed above:  Chest Discomfort/abnormal coronary CT  scan: CT scan concerning for mid LAD lesion with positive CT FFR. - We will go ahead and refer for left heart catheterization with possible percutaneous intervention.  Risks and benefits have been explained.  Including stroke heart attack death renal impairment bleeding.  Her daughter was present for discussion  Carotid artery plaque Previously moderate on left mild on the right.  Continue with Crestor 10 mg.  Hyperlipidemia - Crestor 10 mg once a day.  Refills for medical management as needed.  No myalgias currently.  When she went on higher doses or other statins this caused myalgias.  Coronary vasospasm/Prinzmetal's angina  -This was previously diagnosed about 10 years ago after prior heart catheterization.  Right bundle branch block - Chronic.  Prior echocardiogram in 2020 showed mild LVH but no mitral valve prolapse  Medication Adjustments/Labs and Tests Ordered: Current medicines are reviewed at length with the patient today.  Concerns regarding medicines are outlined above.  Orders Placed This Encounter  Procedures   CBC   Basic metabolic panel   EKG 60-[id number]   No orders of the defined types were placed in this encounter.  Time spent: 43 minutes spent reviewing medical records, discussing cardiac catheterization with patient, reviewing CT scan report  I, Candee Furbish, MD, have reviewed all documentation for this visit. The documentation on 07/18/20 for the exam, diagnosis, procedures, and orders are all accurate and complete.   Patient Instructions  Medication Instructions:  The current medical regimen is effective;  continue present plan and medications.  *If you need a refill on your cardiac medications before your next appointment, please call your pharmacy*  Lab Work: Please have blood work as scheduled on Tuesday(CBC,BMP)  If you have labs (blood work) drawn today and your tests are completely normal, you will receive your results only by: Ruth (if you  have MyChart) OR A paper copy in the mail If you have any lab test that is abnormal or we need to change your treatment, we will call you to review the results.   Testing/Procedures:   [address] OFFICE [place], [address] [place]  [place] [address] Dept: [phone] Loc: [place]  07/18/2020  You are scheduled for a Cardiac Catheterization on Friday, June 17 with Dr. Glenetta Hew.  1. Please arrive at the [facility] (Main Entrance A) at [facility]: [address] [place], [place] [address] at 5:30 AM (two hours before your procedure to ensure your preparation). Free valet parking service is available.   Special note: Every effort is made to have your procedure done on time. Please understand that emergencies sometimes delay scheduled procedures.  2. Diet: Do not eat or drink anything after midnight prior to your procedure except sips of water to take medications.  3. Labs: You will need to have blood drawn on Tuesday, June 14 at [facility] at [facility]. [address]  Open: 7:30am - 5pm    Phone: [phone]. You do not need to be fasting.  4. Medication instructions in preparation for your procedure:  On the morning of your procedure, take your Aspirin and any morning medicines NOT listed above.  You may use sips of water.  5. Plan for one night stay--bring personal belongings. 6. Bring a current list of your medications and current insurance cards. 7. You MUST have a responsible person to drive you home. 8. Someone MUST be with you the first 24 hours after you arrive home or your discharge will be delayed. 9. Please wear clothes that are easy to get on and off and wear slip-on shoes.  Thank you for allowing [place] to care for you!   -- [facility]   Follow-Up: At [facility], you and your health  needs are our priority.  As part of our continuing mission to provide you with exceptional heart care, we have created designated Provider Care Teams.  These Care Teams include your primary Cardiologist (physician) and Advanced Practice Providers (APPs -  Physician Assistants and Nurse Practitioners) who all work together to provide you with the care you need, when you need it.  We recommend signing up for the patient portal called "MyChart".  Sign up information is provided on this After Visit Summary.  MyChart is used to connect with patients for Virtual Visits (Telemedicine).  Patients are able to view lab/test results, encounter notes, upcoming appointments, etc.  Non-urgent messages can be sent to your provider as well.   To learn more about what you can do with MyChart, go to [email or web address].    Your next appointment:   6 month(s)  The format for your next appointment:   In Person  Provider:   Candee Furbish, MD   Thank you for choosing [facility]!!      I,Essence Turner,acting as a scribe for Candee Furbish, MD.,have documented all relevant documentation on the behalf of Candee Furbish, MD,as directed by  Candee Furbish, MD while in the presence of Candee Furbish, MD.   Signed, Candee Furbish, MD  07/18/2020 10:50 AM    [place]

## 2020-07-18 NOTE — Telephone Encounter (Signed)
Examined today by Dr Marlou Porch at her appt with him.

## 2020-07-18 NOTE — Telephone Encounter (Signed)
-----   Message from Jerline Pain, MD sent at 07/17/2020  5:34 PM EDT ----- Mid LAD flow limiting plaque by CT flow analysis.   Let's see if she can come in tomorrow Thursday June 9 at 9:40 (double book) to discuss cardiac cath Candee Furbish, MD

## 2020-07-19 DIAGNOSIS — I2510 Atherosclerotic heart disease of native coronary artery without angina pectoris: Secondary | ICD-10-CM | POA: Diagnosis not present

## 2020-07-23 ENCOUNTER — Other Ambulatory Visit: Payer: Medicare HMO | Admitting: *Deleted

## 2020-07-23 ENCOUNTER — Other Ambulatory Visit: Payer: Self-pay

## 2020-07-23 DIAGNOSIS — Z01812 Encounter for preprocedural laboratory examination: Secondary | ICD-10-CM | POA: Diagnosis not present

## 2020-07-23 DIAGNOSIS — R931 Abnormal findings on diagnostic imaging of heart and coronary circulation: Secondary | ICD-10-CM | POA: Diagnosis not present

## 2020-07-23 LAB — CBC
Hematocrit: 41.5 % (ref 34.0–46.6)
Hemoglobin: 13.9 g/dL (ref 11.1–15.9)
MCH: 30.5 pg (ref 26.6–33.0)
MCHC: 33.5 g/dL (ref 31.5–35.7)
MCV: 91 fL (ref 79–97)
Platelets: 252 10*3/uL (ref 150–450)
RBC: 4.56 x10E6/uL (ref 3.77–5.28)
RDW: 13.1 % (ref 11.7–15.4)
WBC: 6.5 10*3/uL (ref 3.4–10.8)

## 2020-07-23 LAB — BASIC METABOLIC PANEL
BUN/Creatinine Ratio: 24 (ref 12–28)
BUN: 12 mg/dL (ref 8–27)
CO2: 29 mmol/L (ref 20–29)
Calcium: 8.7 mg/dL (ref 8.7–10.3)
Chloride: 99 mmol/L (ref 96–106)
Creatinine, Ser: 0.49 mg/dL — ABNORMAL LOW (ref 0.57–1.00)
Glucose: 101 mg/dL — ABNORMAL HIGH (ref 65–99)
Potassium: 4 mmol/L (ref 3.5–5.2)
Sodium: 137 mmol/L (ref 134–144)
eGFR: 99 mL/min/{1.73_m2} (ref >59)

## 2020-07-25 ENCOUNTER — Telehealth: Payer: Self-pay | Admitting: Cardiology

## 2020-07-25 ENCOUNTER — Telehealth: Payer: Self-pay | Admitting: *Deleted

## 2020-07-25 NOTE — Telephone Encounter (Signed)
LMTCB to review procedure instructions 

## 2020-07-25 NOTE — Telephone Encounter (Signed)
Spoke with pt and made her aware that she just needs to take ASA 81mg  QD

## 2020-07-25 NOTE — Telephone Encounter (Signed)
Patient called in to see if she can take aspirin 81 dose if she can take one night and one in the morning! Please advise

## 2020-07-25 NOTE — Telephone Encounter (Addendum)
Pt contacted pre-catheterization scheduled at [facility] for: Friday July 26, 2020 7:30 AM Verified arrival time and place: [facility]) at: 5:30 AM   No solid food after midnight prior to cath, clear liquids until 5 AM day of procedure.  Hold: Lisinopril/HCT/KCl -AM of procedure  Except hold medications AM meds can be  taken pre-cath with sips of water including: ASA 81 mg    Confirmed patient has responsible adult to drive home post procedure and be with patient first 24 hours after arriving home: yes  You are allowed ONE visitor in the waiting room during the time you are at the hospital for your procedure. Both you and your visitor must wear a mask once you enter the hospital.   Patient reports does not currently have any symptoms concerning for COVID-19 and no household members with COVID-19 like illness.       Reviewed procedure/mask/visitor instructions with patient.

## 2020-07-25 NOTE — Telephone Encounter (Signed)
Patient is having a heart catherization tomorrow morning and on Tuesday, she is having her wisdom tooth filled. Wants to know if there would be a problem or not?

## 2020-07-25 NOTE — Telephone Encounter (Signed)
Will this be ok? 

## 2020-07-25 NOTE — Telephone Encounter (Signed)
Left message to call back  

## 2020-07-25 NOTE — Telephone Encounter (Signed)
Spoke with pt and she states that since she was concerned she just went ahead and moved her dental appt out a couple of weeks post cath.  Pt appreciative for call.

## 2020-07-26 ENCOUNTER — Other Ambulatory Visit: Payer: Self-pay

## 2020-07-26 ENCOUNTER — Ambulatory Visit (HOSPITAL_COMMUNITY)
Admission: RE | Admit: 2020-07-26 | Discharge: 2020-07-26 | Disposition: A | Discharge status: Home / Self Care | Payer: Medicare HMO | Attending: Cardiology | Admitting: Cardiology

## 2020-07-26 ENCOUNTER — Encounter (HOSPITAL_COMMUNITY)
Admission: RE | Disposition: A | Discharge status: Home / Self Care | Payer: Self-pay | Source: Home / Self Care | Attending: Cardiology

## 2020-07-26 DIAGNOSIS — I4510 Unspecified right bundle-branch block: Secondary | ICD-10-CM | POA: Insufficient documentation

## 2020-07-26 DIAGNOSIS — Z79890 Hormone replacement therapy (postmenopausal): Secondary | ICD-10-CM | POA: Diagnosis not present

## 2020-07-26 DIAGNOSIS — R931 Abnormal findings on diagnostic imaging of heart and coronary circulation: Secondary | ICD-10-CM | POA: Clinically undetermined

## 2020-07-26 DIAGNOSIS — I2510 Atherosclerotic heart disease of native coronary artery without angina pectoris: Secondary | ICD-10-CM

## 2020-07-26 DIAGNOSIS — Z888 Allergy status to other drugs, medicaments and biological substances status: Secondary | ICD-10-CM | POA: Insufficient documentation

## 2020-07-26 DIAGNOSIS — D649 Anemia, unspecified: Secondary | ICD-10-CM | POA: Diagnosis not present

## 2020-07-26 DIAGNOSIS — I201 Angina pectoris with documented spasm: Secondary | ICD-10-CM | POA: Diagnosis present

## 2020-07-26 DIAGNOSIS — E785 Hyperlipidemia, unspecified: Secondary | ICD-10-CM | POA: Insufficient documentation

## 2020-07-26 DIAGNOSIS — Z79899 Other long term (current) drug therapy: Secondary | ICD-10-CM | POA: Diagnosis not present

## 2020-07-26 DIAGNOSIS — I25118 Atherosclerotic heart disease of native coronary artery with other forms of angina pectoris: Secondary | ICD-10-CM

## 2020-07-26 DIAGNOSIS — Z7982 Long term (current) use of aspirin: Secondary | ICD-10-CM | POA: Insufficient documentation

## 2020-07-26 DIAGNOSIS — Z882 Allergy status to sulfonamides status: Secondary | ICD-10-CM | POA: Diagnosis not present

## 2020-07-26 DIAGNOSIS — I10 Essential (primary) hypertension: Secondary | ICD-10-CM | POA: Insufficient documentation

## 2020-07-26 DIAGNOSIS — Z01812 Encounter for preprocedural laboratory examination: Secondary | ICD-10-CM

## 2020-07-26 HISTORY — PX: INTRAVASCULAR PRESSURE WIRE/FFR STUDY: CATH118243

## 2020-07-26 HISTORY — PX: LEFT HEART CATH AND CORONARY ANGIOGRAPHY: CATH118249

## 2020-07-26 LAB — POCT ACTIVATED CLOTTING TIME: Activated Clotting Time: 260 seconds

## 2020-07-26 SURGERY — LEFT HEART CATH AND CORONARY ANGIOGRAPHY
Anesthesia: LOCAL

## 2020-07-26 MED ORDER — SODIUM CHLORIDE 0.9 % IV SOLN: 250.0000 mL | INTRAVENOUS | Status: DC | PRN

## 2020-07-26 MED ORDER — ADENOSINE 12 MG/4ML IV SOLN
INTRAVENOUS | Status: AC
  Filled 2020-07-26: qty 4

## 2020-07-26 MED ORDER — LABETALOL HCL 5 MG/ML IV SOLN: 10.0000 mg | INTRAVENOUS | Status: DC | PRN

## 2020-07-26 MED ORDER — SODIUM CHLORIDE 0.9 % WEIGHT BASED INFUSION
3.0000 mL/kg/h | INTRAVENOUS | Status: AC
  Administered 2020-07-26: 3 mL/kg/h via INTRAVENOUS

## 2020-07-26 MED ORDER — HYDRALAZINE HCL 20 MG/ML IJ SOLN: 10.0000 mg | INTRAMUSCULAR | Status: DC | PRN

## 2020-07-26 MED ORDER — MIDAZOLAM HCL 2 MG/2ML IJ SOLN
INTRAMUSCULAR | Status: DC | PRN
  Administered 2020-07-26 (×2): 1 mg via INTRAVENOUS

## 2020-07-26 MED ORDER — LIDOCAINE HCL (PF) 1 % IJ SOLN
INTRAMUSCULAR | Status: DC | PRN
  Administered 2020-07-26: 2 mL

## 2020-07-26 MED ORDER — HEPARIN (PORCINE) IN NACL 1000-0.9 UT/500ML-% IV SOLN
INTRAVENOUS | Status: AC
  Filled 2020-07-26: qty 500

## 2020-07-26 MED ORDER — FENTANYL CITRATE (PF) 100 MCG/2ML IJ SOLN
INTRAMUSCULAR | Status: DC | PRN
  Administered 2020-07-26 (×2): 25 ug via INTRAVENOUS

## 2020-07-26 MED ORDER — VERAPAMIL HCL 2.5 MG/ML IV SOLN
INTRAVENOUS | Status: AC
  Filled 2020-07-26: qty 2

## 2020-07-26 MED ORDER — VERAPAMIL HCL 2.5 MG/ML IV SOLN
INTRAVENOUS | Status: DC | PRN
  Administered 2020-07-26: 10 mL via INTRA_ARTERIAL

## 2020-07-26 MED ORDER — IOHEXOL 350 MG/ML SOLN
INTRAVENOUS | Status: DC | PRN
  Administered 2020-07-26: 65 mL via INTRA_ARTERIAL

## 2020-07-26 MED ORDER — ASPIRIN 81 MG PO CHEW
81.0000 mg | CHEWABLE_TABLET | ORAL | Status: DC
Start: 2020-07-26 — End: 2020-07-26

## 2020-07-26 MED ORDER — SODIUM CHLORIDE 0.9% FLUSH: 3.0000 mL | INTRAVENOUS | Status: DC | PRN

## 2020-07-26 MED ORDER — ONDANSETRON HCL 4 MG/2ML IJ SOLN: 4.0000 mg | Freq: Four times a day (QID) | INTRAMUSCULAR | Status: DC | PRN

## 2020-07-26 MED ORDER — LIDOCAINE HCL (PF) 1 % IJ SOLN
INTRAMUSCULAR | Status: AC
  Filled 2020-07-26: qty 30

## 2020-07-26 MED ORDER — FENTANYL CITRATE (PF) 100 MCG/2ML IJ SOLN
INTRAMUSCULAR | Status: AC
  Filled 2020-07-26: qty 2

## 2020-07-26 MED ORDER — HEPARIN (PORCINE) IN NACL 1000-0.9 UT/500ML-% IV SOLN
INTRAVENOUS | Status: DC | PRN
  Administered 2020-07-26 (×2): 500 mL

## 2020-07-26 MED ORDER — MIDAZOLAM HCL 2 MG/2ML IJ SOLN
INTRAMUSCULAR | Status: AC
  Filled 2020-07-26: qty 2

## 2020-07-26 MED ORDER — SODIUM CHLORIDE 0.9 % IV SOLN: INTRAVENOUS | Status: DC

## 2020-07-26 MED ORDER — HEPARIN SODIUM (PORCINE) 1000 UNIT/ML IJ SOLN
INTRAMUSCULAR | Status: AC
  Filled 2020-07-26: qty 1

## 2020-07-26 MED ORDER — SODIUM CHLORIDE 0.9 % WEIGHT BASED INFUSION: 1.0000 mL/kg/h | INTRAVENOUS | Status: DC

## 2020-07-26 MED ORDER — ADENOSINE (DIAGNOSTIC) FOR INTRACORONARY USE
INTRAVENOUS | Status: DC | PRN
  Administered 2020-07-26: 48 mg via INTRACORONARY

## 2020-07-26 MED ORDER — HEPARIN SODIUM (PORCINE) 1000 UNIT/ML IJ SOLN
INTRAMUSCULAR | Status: DC | PRN
  Administered 2020-07-26: 3000 [IU] via INTRAVENOUS
  Administered 2020-07-26: 3500 [IU] via INTRAVENOUS

## 2020-07-26 MED ORDER — SODIUM CHLORIDE 0.9% FLUSH: 3.0000 mL | Freq: Two times a day (BID) | INTRAVENOUS | Status: DC

## 2020-07-26 MED ORDER — ACETAMINOPHEN 325 MG PO TABS
650.0000 mg | ORAL_TABLET | ORAL | Status: DC | PRN
  Administered 2020-07-26: 650 mg via ORAL
  Filled 2020-07-26: qty 2

## 2020-07-26 SURGICAL SUPPLY — 14 items
CATH INFINITI JR4 5F (CATHETERS) ×1 IMPLANT
CATH LAUNCHER 6FR EBU3.5 (CATHETERS) ×1 IMPLANT
CATH OPTITORQUE TIG 4.0 5F (CATHETERS) ×1 IMPLANT
DEVICE RAD TR BAND REGULAR (VASCULAR PRODUCTS) ×1 IMPLANT
GLIDESHEATH SLEND SS 6F .021 (SHEATH) ×1 IMPLANT
GUIDEWIRE INQWIRE 1.5J.035X260 (WIRE) IMPLANT
GUIDEWIRE PRESSURE X 175 (WIRE) ×1 IMPLANT
INQWIRE 1.5J .035X260CM (WIRE) ×2
KIT ENCORE 26 ADVANTAGE (KITS) ×1 IMPLANT
KIT HEART LEFT (KITS) ×2 IMPLANT
PACK CARDIAC CATHETERIZATION (CUSTOM PROCEDURE TRAY) ×2 IMPLANT
SHEATH PROBE COVER 6X72 (BAG) ×1 IMPLANT
TRANSDUCER W/STOPCOCK (MISCELLANEOUS) ×2 IMPLANT
TUBING CIL FLEX 10 FLL-RA (TUBING) ×2 IMPLANT

## 2020-07-26 NOTE — Progress Notes (Signed)
Arm board applied to right arm

## 2020-07-26 NOTE — Interval H&P Note (Signed)
History and Physical Interval Note:  07/26/2020 7:32 AM  Vanessa Stewart  has presented today for surgery, with the diagnosis of elevator calcium score. & ABNORMAL CORONARY CT ANGIOGRAM  The various methods of treatment have been discussed with the patient and family. After consideration of risks, benefits and other options for treatment, the patient has consented to  Procedure(s): LEFT HEART CATH AND CORONARY ANGIOGRAPHY (N/A)  PERCUTANEOUS CORONARY INTERVENTION   as a surgical intervention.  The patient's history has been reviewed, patient examined, no change in status, stable for surgery.  I have reviewed the patient's chart and labs.  Questions were answered to the patient's satisfaction.    Cath Lab Visit (complete for each Cath Lab visit)  Clinical Evaluation Leading to the Procedure:   ACS: No.  Non-ACS:    Anginal Classification: CCS III  Anti-ischemic medical therapy: Minimal Therapy (1 class of medications)  Non-Invasive Test Results: High-risk stress test findings: cardiac mortality >3%/year  Prior CABG: No previous CABG     Glenetta Hew

## 2020-07-26 NOTE — Discharge Instructions (Signed)
Radial Site Care  This sheet gives you information about how to care for yourself after your procedure. Your health care provider may also give you more specific instructions. If you have problems or questions, contact your health care provider. What can I expect after the procedure? After the procedure, it is common to have: Bruising and tenderness at the catheter insertion area. Follow these instructions at home: Medicines Take over-the-counter and prescription medicines only as told by your health care provider. Insertion site care Follow instructions from your health care provider about how to take care of your insertion site. Make sure you: Wash your hands with soap and water before you remove your bandage (dressing). If soap and water are not available, use hand sanitizer. May remove dressing in 24 hours. Check your insertion site every day for signs of infection. Check for: Redness, swelling, or pain. Fluid or blood. Pus or a bad smell. Warmth. Do no take baths, swim, or use a hot tub for 5 days. You may shower 24-48 hours after the procedure. Remove the dressing and gently wash the site with plain soap and water. Pat the area dry with a clean towel. Do not rub the site. That could cause bleeding. Do not apply powder or lotion to the site. Activity  For 24 hours after the procedure, or as directed by your health care provider: Do not flex or bend the affected arm. Do not push or pull heavy objects with the affected arm. Do not drive yourself home from the hospital or clinic. You may drive 24 hours after the procedure. Do not operate machinery or power tools. KEEP ARM ELEVATED THE REMAINDER OF THE DAY. Do not push, pull or lift anything that is heavier than 10 lb for 5 days. Ask your health care provider when it is okay to: Return to work or school. Resume usual physical activities or sports. Resume sexual activity. General instructions If the catheter site starts to  bleed, raise your arm and put firm pressure on the site. If the bleeding does not stop, get help right away. This is a medical emergency. DRINK PLENTY OF FLUIDS FOR THE NEXT 2-3 DAYS. No alcohol consumption for 24 hours after receiving sedation. If you went home on the same day as your procedure, a responsible adult should be with you for the first 24 hours after you arrive home. Keep all follow-up visits as told by your health care provider. This is important. Contact a health care provider if: You have a fever. You have redness, swelling, or yellow drainage around your insertion site. Get help right away if: You have unusual pain at the radial site. The catheter insertion area swells very fast. The insertion area is bleeding, and the bleeding does not stop when you hold steady pressure on the area. Your arm or hand becomes pale, cool, tingly, or numb. These symptoms may represent a serious problem that is an emergency. Do not wait to see if the symptoms will go away. Get medical help right away. Call your local emergency services (911 in the [place] Do not drive yourself to the hospital. Summary After the procedure, it is common to have bruising and tenderness at the site. Follow instructions from your health care provider about how to take care of your radial site wound. Check the wound every day for signs of infection.  This information is not intended to replace advice given to you by your health care provider. Make sure you discuss any questions you have with   your health care provider. Document Revised: 03/03/2017 Document Reviewed: 03/03/2017 Elsevier Patient Education  2020 Elsevier Inc.  

## 2020-07-26 NOTE — Progress Notes (Signed)
Pt ambulated without difficulty or bleeding.   Discharged home with daughter who will drive and stay with pt x 24 hrs 

## 2020-07-29 ENCOUNTER — Encounter (HOSPITAL_COMMUNITY): Payer: Self-pay | Admitting: Cardiology

## 2020-07-30 ENCOUNTER — Encounter (HOSPITAL_COMMUNITY): Payer: Self-pay | Admitting: Cardiology

## 2020-07-31 ENCOUNTER — Telehealth: Payer: Self-pay | Admitting: Cardiology

## 2020-07-31 NOTE — Telephone Encounter (Signed)
Patient states she had her cath last Friday and states where it was inserted in her arm is a little red. She states it also feels a little hard underneath and feels like it is swollen. She says yesterday it hurt and that it hurts on and off. She states it is on her right arm and she has been putting Band-Aid. She says she thinks it is because after the cath when they pulled it out she started bleeding.

## 2020-07-31 NOTE — Telephone Encounter (Signed)
Spoke with patient.  Reviewed results of cath with her.  She reports her cath site bled after the cath was pulled.  She was sent home with her arm on a board to keep it straight for a day and 1/2.  She removed the ban aid today.  There is a small knot under the cath site, small amount of bruising and no drainage.  Advised pt to continue to monitor, use warm compresses to the area and call back if any changes or further concerns.  Pt was very grateful and stated every one in the [facility] system has been wonderful and she is very impressed with the care she has received.

## 2020-08-01 ENCOUNTER — Telehealth: Payer: Self-pay | Admitting: Cardiology

## 2020-08-01 NOTE — Telephone Encounter (Signed)
Patient has some questions to ask about some tests she has to take and also if she can take ibuprofin again. Please call back

## 2020-08-01 NOTE — Telephone Encounter (Signed)
Spoke with pt who is asking if it would be OK to have the CT of her head the ENT is wanting her to have.  Advised that would need to be determined by the Dr based on her needs, kidney function, and recent radiation exposure.  She states understanding.  She is also asking if OK for her to take Ibuprofen now for h/a.   Advised we typically request pt's use Tylenol as needed prn for general aches/pains.  She was grateful for the call back and information.

## 2020-08-08 DIAGNOSIS — U071 COVID-19: Secondary | ICD-10-CM | POA: Diagnosis not present

## 2020-08-21 DIAGNOSIS — G44219 Episodic tension-type headache, not intractable: Secondary | ICD-10-CM | POA: Diagnosis not present

## 2020-08-21 DIAGNOSIS — J3489 Other specified disorders of nose and nasal sinuses: Secondary | ICD-10-CM | POA: Diagnosis not present

## 2020-08-21 DIAGNOSIS — R519 Headache, unspecified: Secondary | ICD-10-CM | POA: Diagnosis not present

## 2020-08-26 DIAGNOSIS — J301 Allergic rhinitis due to pollen: Secondary | ICD-10-CM | POA: Diagnosis not present

## 2020-08-26 DIAGNOSIS — R040 Epistaxis: Secondary | ICD-10-CM | POA: Diagnosis not present

## 2020-08-26 DIAGNOSIS — G44219 Episodic tension-type headache, not intractable: Secondary | ICD-10-CM | POA: Diagnosis not present

## 2020-09-24 DIAGNOSIS — H16143 Punctate keratitis, bilateral: Secondary | ICD-10-CM | POA: Diagnosis not present

## 2020-09-24 DIAGNOSIS — H43393 Other vitreous opacities, bilateral: Secondary | ICD-10-CM | POA: Diagnosis not present

## 2020-09-24 DIAGNOSIS — H04123 Dry eye syndrome of bilateral lacrimal glands: Secondary | ICD-10-CM | POA: Diagnosis not present

## 2020-10-03 DIAGNOSIS — M7541 Impingement syndrome of right shoulder: Secondary | ICD-10-CM | POA: Diagnosis not present

## 2020-10-03 DIAGNOSIS — M5030 Other cervical disc degeneration, unspecified cervical region: Secondary | ICD-10-CM | POA: Diagnosis not present

## 2020-10-03 DIAGNOSIS — M25511 Pain in right shoulder: Secondary | ICD-10-CM | POA: Diagnosis not present

## 2020-10-03 DIAGNOSIS — M542 Cervicalgia: Secondary | ICD-10-CM | POA: Diagnosis not present

## 2020-10-05 DIAGNOSIS — M7541 Impingement syndrome of right shoulder: Secondary | ICD-10-CM | POA: Insufficient documentation

## 2020-10-05 DIAGNOSIS — M5030 Other cervical disc degeneration, unspecified cervical region: Secondary | ICD-10-CM | POA: Insufficient documentation

## 2020-10-06 ENCOUNTER — Encounter (HOSPITAL_COMMUNITY): Payer: Self-pay | Admitting: Emergency Medicine

## 2020-10-06 ENCOUNTER — Emergency Department (HOSPITAL_COMMUNITY)
Admission: EM | Admit: 2020-10-06 | Discharge: 2020-10-07 | Discharge status: Against Medical Advice | Payer: Medicare HMO | Attending: Emergency Medicine | Admitting: Emergency Medicine

## 2020-10-06 ENCOUNTER — Other Ambulatory Visit: Payer: Self-pay

## 2020-10-06 ENCOUNTER — Emergency Department (HOSPITAL_COMMUNITY): Payer: Medicare HMO

## 2020-10-06 DIAGNOSIS — R0789 Other chest pain: Secondary | ICD-10-CM | POA: Diagnosis not present

## 2020-10-06 DIAGNOSIS — I517 Cardiomegaly: Secondary | ICD-10-CM | POA: Diagnosis not present

## 2020-10-06 DIAGNOSIS — R079 Chest pain, unspecified: Secondary | ICD-10-CM | POA: Insufficient documentation

## 2020-10-06 LAB — CBC
HCT: 44.6 % (ref 36.0–46.0)
Hemoglobin: 14.8 g/dL (ref 12.0–15.0)
MCH: 30.2 pg (ref 26.0–34.0)
MCHC: 33.2 g/dL (ref 30.0–36.0)
MCV: 91 fL (ref 80.0–100.0)
Platelets: 285 10*3/uL (ref 150–400)
RBC: 4.9 MIL/uL (ref 3.87–5.11)
RDW: 13.5 % (ref 11.5–15.5)
WBC: 10.6 10*3/uL — ABNORMAL HIGH (ref 4.0–10.5)
nRBC: 0 % (ref 0.0–0.2)

## 2020-10-06 LAB — BASIC METABOLIC PANEL
Anion gap: 11 (ref 5–15)
BUN: 18 mg/dL (ref 8–23)
CO2: 25 mmol/L (ref 22–32)
Calcium: 9.5 mg/dL (ref 8.9–10.3)
Chloride: 97 mmol/L — ABNORMAL LOW (ref 98–111)
Creatinine, Ser: 0.63 mg/dL (ref 0.44–1.00)
GFR, Estimated: >60 mL/min (ref >60)
Glucose, Bld: 123 mg/dL — ABNORMAL HIGH (ref 70–99)
Potassium: 3.5 mmol/L (ref 3.5–5.1)
Sodium: 133 mmol/L — ABNORMAL LOW (ref 135–145)

## 2020-10-06 LAB — TROPONIN I (HIGH SENSITIVITY): Troponin I (High Sensitivity): 11 ng/L (ref <18)

## 2020-10-06 NOTE — ED Provider Notes (Addendum)
Emergency Medicine Provider Triage Evaluation Note  Vanessa Stewart , a 75 y.o. female  was evaluated in triage.  Pt complains of CP. Began yesterday after chiropractor  adjustment. Starts at left lateral ribs and into left breast. Does not radiate into jaw or arm. No neck pain, dizziness, or numbness/ weakness. No HA. Recently seen by Cardiology for Chest CT. Ate some BBQ chicken after church with some reflux. None currently  Recent heart cath June 2022>>with moderate 2 vessel disease  Review of Systems  Positive: CP, reflux Negative: Back pain, numbness, weakness, HA, neck pain  Physical Exam  There were no vitals taken for this visit. Gen:   Awake, no distress   Resp:  Normal effort  MSK:   Moves extremities without difficulty  Other:    Medical Decision Making  Medically screening exam initiated at 8:13 PM.  Appropriate orders placed.  Charlynn Court was informed that the remainder of the evaluation will be completed by another provider, this initial triage assessment does not replace that evaluation, and the importance of remaining in the ED until their evaluation is complete.  Chest pain       Shoshanah Dapper A, PA-C 10/06/20 2018    Wyvonnia Dusky, MD 10/07/20 [phone]

## 2020-10-06 NOTE — ED Triage Notes (Signed)
Pt reports substernal 9/10 chest pain X2 days.  Some nausea

## 2020-10-06 NOTE — ED Notes (Signed)
Pt name called x3 for vitals no answer.

## 2020-10-07 DIAGNOSIS — R399 Unspecified symptoms and signs involving the genitourinary system: Secondary | ICD-10-CM | POA: Diagnosis not present

## 2020-10-07 DIAGNOSIS — R0789 Other chest pain: Secondary | ICD-10-CM | POA: Diagnosis not present

## 2020-10-31 DIAGNOSIS — H9201 Otalgia, right ear: Secondary | ICD-10-CM | POA: Diagnosis not present

## 2020-10-31 DIAGNOSIS — J301 Allergic rhinitis due to pollen: Secondary | ICD-10-CM | POA: Diagnosis not present

## 2020-11-14 DIAGNOSIS — R350 Frequency of micturition: Secondary | ICD-10-CM | POA: Diagnosis not present

## 2020-11-14 DIAGNOSIS — N390 Urinary tract infection, site not specified: Secondary | ICD-10-CM | POA: Diagnosis not present

## 2020-11-14 DIAGNOSIS — Z23 Encounter for immunization: Secondary | ICD-10-CM | POA: Diagnosis not present

## 2020-12-05 DIAGNOSIS — R350 Frequency of micturition: Secondary | ICD-10-CM | POA: Diagnosis not present

## 2020-12-30 DIAGNOSIS — M7541 Impingement syndrome of right shoulder: Secondary | ICD-10-CM | POA: Diagnosis not present

## 2020-12-30 DIAGNOSIS — M47812 Spondylosis without myelopathy or radiculopathy, cervical region: Secondary | ICD-10-CM | POA: Diagnosis not present

## 2020-12-30 DIAGNOSIS — M5030 Other cervical disc degeneration, unspecified cervical region: Secondary | ICD-10-CM | POA: Diagnosis not present

## 2021-01-07 DIAGNOSIS — E039 Hypothyroidism, unspecified: Secondary | ICD-10-CM | POA: Diagnosis not present

## 2021-01-07 DIAGNOSIS — R69 Illness, unspecified: Secondary | ICD-10-CM | POA: Diagnosis not present

## 2021-01-07 DIAGNOSIS — I201 Angina pectoris with documented spasm: Secondary | ICD-10-CM | POA: Diagnosis not present

## 2021-01-07 DIAGNOSIS — I700 Atherosclerosis of aorta: Secondary | ICD-10-CM | POA: Diagnosis not present

## 2021-01-07 DIAGNOSIS — N390 Urinary tract infection, site not specified: Secondary | ICD-10-CM | POA: Diagnosis not present

## 2021-01-07 DIAGNOSIS — I6529 Occlusion and stenosis of unspecified carotid artery: Secondary | ICD-10-CM | POA: Diagnosis not present

## 2021-01-07 DIAGNOSIS — I10 Essential (primary) hypertension: Secondary | ICD-10-CM | POA: Diagnosis not present

## 2021-01-07 DIAGNOSIS — R7303 Prediabetes: Secondary | ICD-10-CM | POA: Diagnosis not present

## 2021-01-07 DIAGNOSIS — E782 Mixed hyperlipidemia: Secondary | ICD-10-CM | POA: Diagnosis not present

## 2021-01-20 ENCOUNTER — Encounter: Payer: Self-pay | Admitting: Cardiology

## 2021-01-20 ENCOUNTER — Other Ambulatory Visit: Payer: Self-pay

## 2021-01-20 ENCOUNTER — Ambulatory Visit: Payer: Medicare HMO | Admitting: Cardiology

## 2021-01-20 VITALS — BP 160/80 | HR 77 | Ht 62.5 in | Wt 142.0 lb

## 2021-01-20 DIAGNOSIS — E785 Hyperlipidemia, unspecified: Secondary | ICD-10-CM

## 2021-01-20 DIAGNOSIS — I2510 Atherosclerotic heart disease of native coronary artery without angina pectoris: Secondary | ICD-10-CM

## 2021-01-20 DIAGNOSIS — Z79899 Other long term (current) drug therapy: Secondary | ICD-10-CM | POA: Diagnosis not present

## 2021-01-20 LAB — HEMOGLOBIN A1C
Est. average glucose Bld gHb Est-mCnc: 134 mg/dL
Hgb A1c MFr Bld: 6.3 % — ABNORMAL HIGH (ref 4.8–5.6)

## 2021-01-20 MED ORDER — ROSUVASTATIN CALCIUM 10 MG PO TABS: 10.0000 mg | ORAL_TABLET | Freq: Every day | ORAL | 11 refills | Status: DC

## 2021-01-20 NOTE — Assessment & Plan Note (Signed)
Heart catheterization revealed nonflow limiting LAD disease.  Also mid circumflex lesion 60%.  Continue with aggressive medical management.  I will increase her Crestor to 10 mg a day.  She is only taking 5 mg a day.  We may need to increase this to 20 eventually.  Her current LDL is 84.  We will repeat lipid panel in 3 months with ALT.  She also requested that we check a hemoglobin A1c.  She mentioned this twice during her visit today.  We will go ahead and check this for her.  She is not having any anginal symptoms.

## 2021-01-20 NOTE — Patient Instructions (Signed)
Medication Instructions:  Please increase your Crestor to 10 mg a day. Continue all other medications as listed.  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  Follow-Up: At [facility], you and your health needs are our priority.  As part of our continuing mission to provide you with exceptional heart care, we have created designated Provider Care Teams.  These Care Teams include your primary Cardiologist (physician) and Advanced Practice Providers (APPs -  Physician Assistants and Nurse Practitioners) who all work together to provide you with the care you need, when you need it.  We recommend signing up for the patient portal called "MyChart".  Sign up information is provided on this After Visit Summary.  MyChart is used to connect with patients for Virtual Visits (Telemedicine).  Patients are able to view lab/test results, encounter notes, upcoming appointments, etc.  Non-urgent messages can be sent to your provider as well.   To learn more about what you can do with MyChart, go to [email or web address].    Your next appointment:   6 month(s)  The format for your next appointment:   In Person  Provider:   Melina Copa, PA-C, Cecilie Kicks, NP, Ermalinda Barrios, PA-C, Christen Bame, NP, or Richardson Dopp, PA-C     Then, Candee Furbish, MD will plan to see you again in 1 year(s).    Thank you for choosing [place]!!

## 2021-01-20 NOTE — Assessment & Plan Note (Signed)
Moderate on the left mild on the right.  Increased Crestor to 10 mg.  Aspirin 81 mg a day.

## 2021-01-20 NOTE — Assessment & Plan Note (Signed)
Increasing Crestor to 10 mg.  As above.

## 2021-01-20 NOTE — Assessment & Plan Note (Signed)
This was diagnosed several years ago.  Heart catheterization as above.

## 2021-01-20 NOTE — Assessment & Plan Note (Signed)
Stable, no syncopal symptoms.  Prior echocardiogram 2020 showed mild LVH but no mitral valve prolapse.

## 2021-01-20 NOTE — Progress Notes (Signed)
Cardiology Office Note:    Date:  01/20/2021   ID:  Vanessa Stewart, DOB [birth year], MRN [id number]  PCP:  Mayra Neer, MD   [facility] Providers Cardiologist:  Candee Furbish, MD     Referring MD: Mayra Neer, MD   History of Present Illness:    Vanessa Stewart is a 75 y.o. female is here for follow-up of coronary artery disease status post heart catheterization after abnormal coronary CT scan.  This resulted in medical management with noted circumflex and LAD moderate nonflow limiting disease on cath assessment FFR.  Overall she is feeling well.  No anginal symptoms.  No shortness of breath.  She is only taking Crestor 5 mg a day.  Previous Visit:  Office visit for valuation of chest pain at the request of Dr.Shaw.  In review of her note from clinic on 03/15/2020 she has a history of Prinzmetal's angina but has not used nitroglycerin in the year.  She was having 4 days of belching bloating and mild nausea.  She was exercising without issue then developed left-sided jaw pain while sitting and talking to her sister.  She took some Pepcid and Tums and this seemed to help.  She had more jaw pain however after waking up the next day.  Possible reflux or angina.  EKG was unchanged. She is on aspirin.   Right bundle branch block chronic.  She did have an echocardiogram in 2020 that showed mild LVH no mitral valve prolapse.   Mother died 59 brain aneurysm Brother and Sisiter died heart attacks  Today, she is doing moderately well. From her last visit she reports her jaw pain has resolved.   She denies exertional shortness of breath, chest pain, palpitations, tightness or pressure. She denies having PND, orthopnea, dizziness nor syncopal episodes.     Past Medical History:  Diagnosis Date   Allergy    Carotid artery stenosis 07/03/2015   1-39% bilateral by  dopplers 07/2015   Depression    Essential tremor    Hyperlipidemia    Hypertension    Hypothyroidism    Osteopenia     Prinzmetal's angina (HCC)    normal coronary arteries with coronary artery vasospasm at time of cath   RBBB    Chronic    Past Surgical History:  Procedure Laterality Date   CARDIAC CATHETERIZATION  12/09   coronary spasm noted on heart cath. No coronary artery disease. LV function normal   colonoscopy  07/07   ganem- 10 Years   INTRAVASCULAR PRESSURE WIRE/FFR STUDY N/A 07/26/2020   Procedure: INTRAVASCULAR PRESSURE WIRE/FFR STUDY;  Surgeon: Leonie Man, MD;  Location: [place] [facility];  Service: Cardiovascular;  Laterality: N/A;   KNEE SURGERY     LEFT HEART CATH AND CORONARY ANGIOGRAPHY N/A 07/26/2020   Procedure: LEFT HEART CATH AND CORONARY ANGIOGRAPHY;  Surgeon: Leonie Man, MD;  Location: [place] [facility];  Service: Cardiovascular;  Laterality: N/A;   THYROIDECTOMY     TOTAL ABDOMINAL HYSTERECTOMY W/ BILATERAL SALPINGOOPHORECTOMY      Current Medications: Current Meds  Medication Sig   alendronate (FOSAMAX) 70 MG tablet Take 70 mg by mouth daily.   ALPRAZolam (XANAX) 0.5 MG tablet Take 0.5 mg by mouth 2 (two) times daily as needed. (anxiety)   aspirin 81 MG tablet Take 81 mg by mouth daily.   CALCIUM CARBONATE PO Take by mouth.   diltiazem (CARDIZEM CD) 180 MG 24 hr capsule Take 180 mg by mouth daily.  levothyroxine (SYNTHROID, LEVOTHROID) 50 MCG tablet Take 50 mcg by mouth daily before breakfast.   lisinopril-hydrochlorothiazide (PRINZIDE,ZESTORETIC) 20-25 MG per tablet Take 1 tablet by mouth daily.   Omega-3 Fatty Acids (FISH OIL) 1000 MG CAPS Take 1 capsule by mouth daily.   PARoxetine (PAXIL) 20 MG tablet Take 20 mg by mouth daily.   potassium chloride SA (K-DUR,KLOR-CON) 20 MEQ tablet Take 1 tablet (20 mEq total) by mouth 2 (two) times daily. Please make overdue appt with Dr. Radford Pax before anymore refills. 1st attempt   rosuvastatin (CRESTOR) 10 MG tablet Take 1 tablet (10 mg total) by mouth daily.   [DISCONTINUED] rosuvastatin (CRESTOR) 5 MG tablet Take 5  mg by mouth daily.     Allergies:   Elemental sulfur, Fosamax [alendronate], Lipitor [atorvastatin], Pravastatin, Septra [sulfamethoxazole-trimethoprim], Sulfa antibiotics, and Zetia [ezetimibe]   Social History   Socioeconomic History   Marital status: Widowed    Spouse name: Not on file   Number of children: Not on file   Years of education: Not on file   Highest education level: Not on file  Occupational History   Not on file  Tobacco Use   Smoking status: Never   Smokeless tobacco: Never  Vaping Use   Vaping Use: Never used  Substance and Sexual Activity   Alcohol use: No    Alcohol/week: 0.0 standard drinks   Drug use: No   Sexual activity: Not on file  Other Topics Concern   Not on file  Social History Narrative   Not on file   Social Determinants of Health   Financial Resource Strain: Not on file  Food Insecurity: Not on file  Transportation Needs: Not on file  Physical Activity: Not on file  Stress: Not on file  Social Connections: Not on file     Family History: The patient's family history includes Heart disease in her brother, brother, mother, and sister; Stroke in her father.  ROS:   Please see the history of present illness. All other systems reviewed and are negative.  EKGs/Labs/Other Studies Reviewed:    The following studies were reviewed today:  Vas US carotid Duplex Bilateral  04/24/2020 Summary:  Right Carotid: Velocities in the right ICA are consistent with a 1-39%  stenosis.   Left Carotid: Velocities in the left ICA are consistent with a 40-59%  stenosis ,                distal segment, due to tortuosity.   Echo 02/01/2019 IMPRESSIONS   1. Left ventricular ejection fraction, by visual estimation, is 60 to  65%. The left ventricle has normal function. There is mildly increased  left ventricular hypertrophy.   2. Left ventricular diastolic parameters are indeterminate.   3. The left ventricle has no regional wall motion  abnormalities.   4. Global right ventricle has normal systolic function.The right  ventricular size is normal. No increase in right ventricular wall  thickness.   5. Left atrial size was normal.   6. Right atrial size was normal.   7. The mitral valve is normal in structure. Trivial mitral valve  regurgitation. No evidence of mitral stenosis.   8. No significant mitral valve prolapse appreciated.   9. The tricuspid valve is normal in structure.  10. The aortic valve is tricuspid. Aortic valve regurgitation is not  visualized. Mild aortic valve sclerosis without stenosis.  11. The pulmonic valve was normal in structure. Pulmonic valve  regurgitation is trivial.  12. Mildly elevated pulmonary artery systolic  pressure.  13. The inferior vena cava is normal in size with greater than 50%  respiratory variability, suggesting right atrial pressure of 3 mmHg.  CT Coronary  07/17/2020 IMPRESSION: 1. Coronary artery calicum score 46 Agatston units. This places the patient in the 51st percentile for age and gender, suggesting intermediate risk for future cardiac events.   2. Discrete calcified mid LAD stenosis, hemodynamically significant by FFR (0.57).  Cardiac catheterization 07/26/2020: Mid LAD-1 lesion is 30% stenosed. Mid LAD-2 lesion is 60% stenosed. RFR 0.91 -> FFR 0.87 (Not significant) Mid LAD to Dist LAD lesion is 30% stenosed. Mid Cx lesion is 60% stenosed. RFR 0.93 (Not significant) LV end diastolic pressure is normal.   SUMMARY Moderate Two-Vessel Disease: Both non-Physiologically significant lesions. Mid LAD discrete eccentric, 60-65% stenosis just prior to branching into equal size major 3rd Diag and distal LAD  CT FFR positive, however RFR 0.91, FFR 0.87 -> not physiologically significant Focal "ostial " 60% lesion in the LCx just after 1st Mrg --  RFR negative, 0-.93 Normal RCA Normal LVEDP  DG Chest  03/16/2020 IMPRESSION: No acute cardiopulmonary abnormality.  EKG:   EKG showed sinus rhythm, 62 bpm, right bundle branch (RBB), T-wave inversion interior and anterior leads  Recent Labs: 10/06/2020: BUN 18; Creatinine, Ser 0.63; Hemoglobin 14.8; Platelets 285; Potassium 3.5; Sodium 133  Recent Lipid Panel    Component Value Date/Time   CHOL 148 11/25/2015 1053   TRIG 95 11/25/2015 1053   HDL 68 11/25/2015 1053   CHOLHDL 2.2 11/25/2015 1053   VLDL 19 11/25/2015 1053   LDLCALC 61 11/25/2015 1053   LDLDIRECT 114.0 07/03/2014 1046     Risk Assessment/Calculations:      Physical Exam:    VS:  BP (!) 160/80 (BP Location: Left Arm, Patient Position: Sitting, Cuff Size: Normal)   Pulse 77   Ht 5' 2.5" (1.588 m)   Wt 142 lb (64.4 kg)   SpO2 95%   BMI 25.56 kg/m     Wt Readings from Last 3 Encounters:  01/20/21 142 lb (64.4 kg)  07/26/20 149 lb (67.6 kg)  07/18/20 149 lb (67.6 kg)     GEN: Well nourished, well developed, in no acute distress HEENT: normal Neck: no JVD, carotid bruits, or masses Cardiac: RRR; no murmurs, rubs, or gallops,no edema  Respiratory:  clear to auscultation bilaterally, normal work of breathing GI: soft, nontender, nondistended, + BS MS: no deformity or atrophy Skin: warm and dry, no rash Neuro:  Alert and Oriented x 3, Strength and sensation are intact Psych: euthymic mood, full affect   ASSESSMENT:    1. Medication management   2. Coronary artery disease involving native coronary artery of native heart without angina pectoris   3. Hyperlipidemia, unspecified hyperlipidemia type     PLAN:    In order of problems listed above:  Coronary artery disease involving native coronary artery of native heart without angina pectoris Heart catheterization revealed nonflow limiting LAD disease.  Also mid circumflex lesion 60%.  Continue with aggressive medical management.  I will increase her Crestor to 10 mg a day.  She is only taking 5 mg a day.  We may need to increase this to 20 eventually.  Her current LDL is 84.   We will repeat lipid panel in 3 months with ALT.  She also requested that we check a hemoglobin A1c.  She mentioned this twice during her visit today.  We will go ahead and check this for her.  She  is not having any anginal symptoms.  Carotid artery stenosis Moderate on the left mild on the right.  Increased Crestor to 10 mg.  Aspirin 81 mg a day.  Hyperlipidemia Increasing Crestor to 10 mg.  As above.  RBBB Stable, no syncopal symptoms.  Prior echocardiogram 2020 showed mild LVH but no mitral valve prolapse.  Prinzmetal angina ([place]) This was diagnosed several years ago.  Heart catheterization as above.    Medication Adjustments/Labs and Tests Ordered: Current medicines are reviewed at length with the patient today.  Concerns regarding medicines are outlined above.  Orders Placed This Encounter  Procedures   HgB A1c   ALT   Lipid panel    Meds ordered this encounter  Medications   rosuvastatin (CRESTOR) 10 MG tablet    Sig: Take 1 tablet (10 mg total) by mouth daily.    Dispense:  30 tablet    Refill:  11   Patient Instructions  Medication Instructions:  Please increase your Crestor to 10 mg a day. Continue all other medications as listed.  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  Follow-Up: At [facility], you and your health needs are our priority.  As part of our continuing mission to provide you with exceptional heart care, we have created designated Provider Care Teams.  These Care Teams include your primary Cardiologist  (physician) and Advanced Practice Providers (APPs -  Physician Assistants and Nurse Practitioners) who all work together to provide you with the care you need, when you need it.  We recommend signing up for the patient portal called "MyChart".  Sign up information is provided on this After Visit Summary.  MyChart is used to connect with patients for Virtual Visits (Telemedicine).  Patients are able to view lab/test results, encounter notes, upcoming appointments, etc.  Non-urgent messages can be sent to your provider as well.   To learn more about what you can do with MyChart, go to [email or web address].    Your next appointment:   6 month(s)  The format for your next appointment:   In Person  Provider:   Melina Copa, PA-C, Cecilie Kicks, NP, Ermalinda Barrios, PA-C, Christen Bame, NP, or Richardson Dopp, PA-C     Then, Candee Furbish, MD will plan to see you again in 1 year(s).    Thank you for choosing [facility]!!      Signed, Candee Furbish, MD  01/20/2021 12:47 PM    [place]

## 2021-01-21 ENCOUNTER — Telehealth: Payer: Self-pay | Admitting: Cardiology

## 2021-01-21 NOTE — Telephone Encounter (Signed)
Did not need this encounter °

## 2021-01-21 NOTE — Telephone Encounter (Signed)
° °  Pt c/o medication issue:  1. Name of Medication:   rosuvastatin (CRESTOR) 10 MG tablet    2. How are you currently taking this medication (dosage and times per day)? Take 1 tablet (10 mg total) by mouth daily.  3. Are you having a reaction (difficulty breathing--STAT)?   4. What is your medication issue? Pt said she's been taking 10 mg of crestor for a year now and wanted to check in with Dr. Marlou Porch if he needs to increased her dosage. Also, she wanted to check in if her A1c results is available

## 2021-01-21 NOTE — Telephone Encounter (Signed)
Pt calls back to office today stating she has been taking Crestor 10 mg daily since 12/2019.  Dr Brigitte Pulse had increased her dose a year ago and she failed to report the increase to [place] when her medication were reviewed yesterday.  Pt advised this office she was taking 5 mg daily.  Will have Dr Marlou Porch review to see if further increase is necessary at this time.

## 2021-01-23 NOTE — Telephone Encounter (Signed)
Left message for pt Dr Marlou Porch would like for her to have Lipid panel.  Orders are in the computer as she was scheduled for 3/23 for repeat after what Dr Marlou Porch believed to be an increase in her Crestor dose.  Rescheduled she call back to reschedule her blood work.

## 2021-01-23 NOTE — Telephone Encounter (Signed)
Thanks for the update.  Lets make sure she has a recent LDL.

## 2021-01-24 NOTE — Telephone Encounter (Signed)
**  Requested she call back to reschedule her blood work.

## 2021-02-12 ENCOUNTER — Telehealth: Payer: Self-pay | Admitting: Cardiology

## 2021-02-12 NOTE — Telephone Encounter (Signed)
Patient mistakenly told cardiology staff at office visit she was taking Crestor 5 mg.We increased to 10 mg. She then realizes she has been on 10 mg dose for over a year. She had recent lab work with pcp on 01/07/21 which included lipids (LDL 84) which have been scanned into media.   Her question is, does she need statin dose change?     I will forward to Dr.Skains

## 2021-02-12 NOTE — Telephone Encounter (Signed)
Pt is wanting to know what is needing to be done in regards to her cholesterol... please advise

## 2021-02-14 MED ORDER — ROSUVASTATIN CALCIUM 20 MG PO TABS: 20.0000 mg | ORAL_TABLET | Freq: Every day | ORAL | 3 refills | Status: DC

## 2021-02-14 NOTE — Telephone Encounter (Signed)
Increase Crestor to 20mg  (has CAD, LDL goal < 70) now 84 Check lipids in 2 months  Candee Furbish, MD

## 2021-02-14 NOTE — Telephone Encounter (Signed)
Pt is aware to increase Crestor to 20 mg a day.  She will have repeat lab in 2 months as scheduled (3/23)  She will c/b if any questions or concerns.

## 2021-02-19 DIAGNOSIS — D225 Melanocytic nevi of trunk: Secondary | ICD-10-CM | POA: Diagnosis not present

## 2021-02-19 DIAGNOSIS — L565 Disseminated superficial actinic porokeratosis (DSAP): Secondary | ICD-10-CM | POA: Diagnosis not present

## 2021-02-19 DIAGNOSIS — D2272 Melanocytic nevi of left lower limb, including hip: Secondary | ICD-10-CM | POA: Diagnosis not present

## 2021-02-19 DIAGNOSIS — D1801 Hemangioma of skin and subcutaneous tissue: Secondary | ICD-10-CM | POA: Diagnosis not present

## 2021-02-19 DIAGNOSIS — L821 Other seborrheic keratosis: Secondary | ICD-10-CM | POA: Diagnosis not present

## 2021-02-19 DIAGNOSIS — D2372 Other benign neoplasm of skin of left lower limb, including hip: Secondary | ICD-10-CM | POA: Diagnosis not present

## 2021-02-19 DIAGNOSIS — L570 Actinic keratosis: Secondary | ICD-10-CM | POA: Diagnosis not present

## 2021-04-01 DIAGNOSIS — M25511 Pain in right shoulder: Secondary | ICD-10-CM | POA: Diagnosis not present

## 2021-04-10 ENCOUNTER — Other Ambulatory Visit: Payer: Self-pay

## 2021-04-10 DIAGNOSIS — K219 Gastro-esophageal reflux disease without esophagitis: Secondary | ICD-10-CM | POA: Insufficient documentation

## 2021-04-10 DIAGNOSIS — I059 Rheumatic mitral valve disease, unspecified: Secondary | ICD-10-CM | POA: Insufficient documentation

## 2021-04-10 DIAGNOSIS — I6522 Occlusion and stenosis of left carotid artery: Secondary | ICD-10-CM

## 2021-04-10 DIAGNOSIS — F419 Anxiety disorder, unspecified: Secondary | ICD-10-CM | POA: Insufficient documentation

## 2021-04-10 DIAGNOSIS — E039 Hypothyroidism, unspecified: Secondary | ICD-10-CM | POA: Insufficient documentation

## 2021-04-10 DIAGNOSIS — R7303 Prediabetes: Secondary | ICD-10-CM | POA: Insufficient documentation

## 2021-04-10 DIAGNOSIS — M810 Age-related osteoporosis without current pathological fracture: Secondary | ICD-10-CM | POA: Insufficient documentation

## 2021-04-10 DIAGNOSIS — J309 Allergic rhinitis, unspecified: Secondary | ICD-10-CM | POA: Insufficient documentation

## 2021-04-10 DIAGNOSIS — H6090 Unspecified otitis externa, unspecified ear: Secondary | ICD-10-CM | POA: Insufficient documentation

## 2021-04-10 DIAGNOSIS — G629 Polyneuropathy, unspecified: Secondary | ICD-10-CM | POA: Insufficient documentation

## 2021-04-10 DIAGNOSIS — R202 Paresthesia of skin: Secondary | ICD-10-CM | POA: Insufficient documentation

## 2021-04-25 ENCOUNTER — Other Ambulatory Visit: Payer: Medicare HMO

## 2021-04-28 ENCOUNTER — Ambulatory Visit (HOSPITAL_COMMUNITY)
Admission: RE | Admit: 2021-04-28 | Discharge: 2021-04-28 | Disposition: A | Discharge status: Home / Self Care | Payer: Medicare HMO | Source: Ambulatory Visit | Attending: Surgery | Admitting: Surgery

## 2021-04-28 ENCOUNTER — Ambulatory Visit: Payer: Medicare HMO | Admitting: Physician Assistant

## 2021-04-28 ENCOUNTER — Other Ambulatory Visit: Payer: Self-pay

## 2021-04-28 VITALS — BP 155/88 | HR 79 | Temp 97.2°F | Resp 16 | Ht 63.0 in | Wt 144.0 lb

## 2021-04-28 DIAGNOSIS — I6522 Occlusion and stenosis of left carotid artery: Secondary | ICD-10-CM | POA: Insufficient documentation

## 2021-04-28 NOTE — Progress Notes (Signed)
?Office Note  ? ? ? ?CC:  follow up ?Requesting Provider:  Mayra Neer, MD ? ?HPI: Vanessa Stewart is a 76 y.o. (11/29/1945) female who presents for surveillance of carotid artery stenosis.  She denies any diagnosis of CVA or TIA since last office visit.  She also denies any current strokelike symptoms including slurring speech, changes in vision, or one-sided weakness.  She recently underwent coronary catheterization due to chest pain and June 2022 which was negative.  She has follow-up with her cardiologist in a couple weeks.  She is on aspirin and statin daily. ? ? ? ?Past Medical History:  ?Diagnosis Date  ? Allergy   ? Carotid artery stenosis 07/03/2015  ? 1-39% bilateral by  dopplers 07/2015  ? Depression   ? Essential tremor   ? Hyperlipidemia   ? Hypertension   ? Hypothyroidism   ? Osteopenia   ? Prinzmetal's angina ([place])   ? normal coronary arteries with coronary artery vasospasm at time of cath  ? RBBB   ? Chronic  ? ? ?Past Surgical History:  ?Procedure Laterality Date  ? CARDIAC CATHETERIZATION  12/09  ? coronary spasm noted on heart cath. No coronary artery disease. LV function normal  ? colonoscopy  07/07  ? ganem- 10 Years  ? INTRAVASCULAR PRESSURE WIRE/FFR STUDY N/A 07/26/2020  ? Procedure: INTRAVASCULAR PRESSURE WIRE/FFR STUDY;  Surgeon: Leonie Man, MD;  Location: [facility];  Service: Cardiovascular;  Laterality: N/A;  ? KNEE SURGERY    ? LEFT HEART CATH AND CORONARY ANGIOGRAPHY N/A 07/26/2020  ? Procedure: LEFT HEART CATH AND CORONARY ANGIOGRAPHY;  Surgeon: Leonie Man, MD;  Location: [facility];  Service: Cardiovascular;  Laterality: N/A;  ? THYROIDECTOMY    ? TOTAL ABDOMINAL HYSTERECTOMY W/ BILATERAL SALPINGOOPHORECTOMY    ? ? ?Social History  ? ?Socioeconomic History  ? Marital status: Widowed  ?  Spouse name: Not on file  ? Number of children: Not on file  ? Years of education: Not on file  ? Highest education level: Not on file  ?Occupational History  ? Not on file   ?Tobacco Use  ? Smoking status: Never  ? Smokeless tobacco: Never  ?Vaping Use  ? Vaping Use: Never used  ?Substance and Sexual Activity  ? Alcohol use: No  ?  Alcohol/week: 0.0 standard drinks  ? Drug use: No  ? Sexual activity: Not on file  ?Other Topics Concern  ? Not on file  ?Social History Narrative  ? Not on file  ? ?Social Determinants of Health  ? ?Financial Resource Strain: Not on file  ?Food Insecurity: Not on file  ?Transportation Needs: Not on file  ?Physical Activity: Not on file  ?Stress: Not on file  ?Social Connections: Not on file  ?Intimate Partner Violence: Not on file  ? ? ?Family History  ?Problem Relation Age of Onset  ? Heart disease Mother   ? Stroke Father   ? Heart disease Sister   ? Heart disease Brother   ? Heart disease Brother   ? ? ?Current Outpatient Medications  ?Medication Sig Dispense Refill  ? ALPRAZolam (XANAX) 0.5 MG tablet Take 0.5 mg by mouth 2 (two) times daily as needed. (anxiety)    ? aspirin 81 MG tablet Take 81 mg by mouth daily.    ? CALCIUM CARBONATE PO Take by mouth.    ? diltiazem (CARDIZEM CD) 180 MG 24 hr capsule Take 180 mg by mouth daily.    ?  levothyroxine (SYNTHROID, LEVOTHROID) 50 MCG tablet Take 50 mcg by mouth daily before breakfast.    ? lisinopril-hydrochlorothiazide (PRINZIDE,ZESTORETIC) 20-25 MG per tablet Take 1 tablet by mouth daily.    ? Omega-3 Fatty Acids (FISH OIL) 1000 MG CAPS Take 1 capsule by mouth daily.    ? PARoxetine (PAXIL) 20 MG tablet Take 20 mg by mouth daily.    ? potassium chloride SA (K-DUR,KLOR-CON) 20 MEQ tablet Take 1 tablet (20 mEq total) by mouth 2 (two) times daily. Please make overdue appt with Dr. Radford Pax before anymore refills. 1st attempt 60 tablet 0  ? rosuvastatin (CRESTOR) 20 MG tablet Take 1 tablet (20 mg total) by mouth daily. 90 tablet 3  ? alendronate (FOSAMAX) 70 MG tablet Take 70 mg by mouth daily. (Patient not taking: Reported on 04/28/2021)    ? ?No current facility-administered medications for this visit.   ? ? ?Allergies  ?Allergen Reactions  ? Elemental Sulfur Other (See Comments)  ?  GI Issues  ? Fosamax [Alendronate] Other (See Comments)  ?  Jaw pain ?  ? Lipitor [Atorvastatin] Other (See Comments)  ?  Aches and pains ?  ? Pravastatin Other (See Comments)  ?  Aches and pains ?  ? Septra [Sulfamethoxazole-Trimethoprim] Other (See Comments)  ?  GI issues  ? Sulfa Antibiotics Nausea And Vomiting  ?  GI issues  ? Zetia [Ezetimibe] Other (See Comments)  ?  Aches and pains ?  ? ? ? ?REVIEW OF SYSTEMS:  ? ?'[X]'$  denotes positive finding, '[ ]'$  denotes negative finding ?Cardiac  Comments:  ?Chest pain or chest pressure:    ?Shortness of breath upon exertion:    ?Short of breath when lying flat:    ?Irregular heart rhythm:    ?    ?Vascular    ?Pain in calf, thigh, or hip brought on by ambulation:    ?Pain in feet at night that wakes you up from your sleep:     ?Blood clot in your veins:    ?Leg swelling:     ?    ?Pulmonary    ?Oxygen at home:    ?Productive cough:     ?Wheezing:     ?    ?Neurologic    ?Sudden weakness in arms or legs:     ?Sudden numbness in arms or legs:     ?Sudden onset of difficulty speaking or slurred speech:    ?Temporary loss of vision in one eye:     ?Problems with dizziness:     ?    ?Gastrointestinal    ?Blood in stool:     ?Vomited blood:     ?    ?Genitourinary    ?Burning when urinating:     ?Blood in urine:    ?    ?Psychiatric    ?Major depression:     ?    ?Hematologic    ?Bleeding problems:    ?Problems with blood clotting too easily:    ?    ?Skin    ?Rashes or ulcers:    ?    ?Constitutional    ?Fever or chills:    ? ? ?PHYSICAL EXAMINATION: ? ?Vitals:  ? 04/28/21 0940 04/28/21 0946  ?BP: (!) 147/84 (!) 155/88  ?Pulse: 79 79  ?Resp: 16   ?Temp: (!) 97.2 ?F (36.2 ?C)   ?TempSrc: Temporal   ?SpO2: 96%   ?Weight: 144 lb (65.3 kg)   ?Height: '5\' 3"'$  (1.6 m)   ? ? ?General:  WDWN in NAD; vital signs documented above ?Gait: Not observed ?HENT: WNL, normocephalic ?Pulmonary: normal non-labored  breathing , without Rales, rhonchi,  wheezing ?Cardiac: regular HR ?Abdomen: soft, NT, no masses ?Skin: without rashes ?Vascular Exam/Pulses: ? Right Left  ?Radial 2+ (normal) 2+ (normal)  ?DP 2+ (normal) 2+ (normal)  ? ?Extremities: without ischemic changes, without Gangrene , without cellulitis; without open wounds;  ?Musculoskeletal: no muscle wasting or atrophy  ?Neurologic: A&O X 3;  CN grossly intact ?Psychiatric:  The pt has Normal affect. ? ? ?Non-Invasive Vascular Imaging:   ?Right ICA 1 to 39% stenosis ?Distal left ICA 40 to 59% stenosis ? ? ? ?ASSESSMENT/PLAN:: 76 y.o. female here for follow up for surveillance of carotid artery stenosis ? ?-Subjectively has not had any neurological symptoms since last office visit ?-Carotid duplex demonstrates 1 to 39% stenosis of right ICA and 40 to 59% stenosis of distal left ICA.  No indication for revascularization of left ICA currently ?-Continue aspirin and statin daily ?-Recheck carotid duplex in 1 year per protocol ? ? ?Dagoberto Ligas, PA-C ?[facility] ?[phone] ? ?Clinic MD:   Trula Slade ? ?

## 2021-05-08 DIAGNOSIS — M79672 Pain in left foot: Secondary | ICD-10-CM | POA: Diagnosis not present

## 2021-05-08 DIAGNOSIS — M19079 Primary osteoarthritis, unspecified ankle and foot: Secondary | ICD-10-CM | POA: Diagnosis not present

## 2021-05-14 ENCOUNTER — Other Ambulatory Visit: Payer: Medicare HMO | Admitting: *Deleted

## 2021-05-14 DIAGNOSIS — Z79899 Other long term (current) drug therapy: Secondary | ICD-10-CM | POA: Diagnosis not present

## 2021-05-14 DIAGNOSIS — E785 Hyperlipidemia, unspecified: Secondary | ICD-10-CM | POA: Diagnosis not present

## 2021-05-14 LAB — LIPID PANEL
Chol/HDL Ratio: 2.7 ratio (ref 0.0–4.4)
Cholesterol, Total: 154 mg/dL (ref 100–199)
HDL: 57 mg/dL (ref >39)
LDL Chol Calc (NIH): 76 mg/dL (ref 0–99)
Triglycerides: 120 mg/dL (ref 0–149)
VLDL Cholesterol Cal: 21 mg/dL (ref 5–40)

## 2021-05-14 LAB — ALT: ALT: 7 IU/L (ref 0–32)

## 2021-05-16 ENCOUNTER — Telehealth: Payer: Self-pay | Admitting: *Deleted

## 2021-05-16 DIAGNOSIS — Z79899 Other long term (current) drug therapy: Secondary | ICD-10-CM

## 2021-05-16 DIAGNOSIS — E785 Hyperlipidemia, unspecified: Secondary | ICD-10-CM

## 2021-05-16 MED ORDER — EZETIMIBE 10 MG PO TABS: 10.0000 mg | ORAL_TABLET | Freq: Every day | ORAL | 3 refills | Status: DC

## 2021-05-16 NOTE — Telephone Encounter (Signed)
LDL 76 improved on Crestor 20 mg use because of evidence of coronary artery disease.  Goal LDL is less than 70 to help reduce risk of heart attack and stroke.  To get there, lets use Zetia 10 mg once a day in conjunction with the Crestor.  Repeat lipid panel in 3 months. ?Vanessa Furbish, MD  ?Written by Jerline Pain, MD on 05/16/2021  6:46 AM EDT ? ?Pt aware of the above results and orders.  She requests zetia be sent into [facility].  Lab ordered and scheduled for 08/20/21 ?

## 2021-06-02 DIAGNOSIS — G44219 Episodic tension-type headache, not intractable: Secondary | ICD-10-CM | POA: Diagnosis not present

## 2021-06-04 ENCOUNTER — Other Ambulatory Visit: Payer: Self-pay | Admitting: Orthopedic Surgery

## 2021-06-04 DIAGNOSIS — M79672 Pain in left foot: Secondary | ICD-10-CM

## 2021-06-20 DIAGNOSIS — Z1231 Encounter for screening mammogram for malignant neoplasm of breast: Secondary | ICD-10-CM | POA: Diagnosis not present

## 2021-06-26 ENCOUNTER — Ambulatory Visit
Admission: RE | Admit: 2021-06-26 | Discharge: 2021-06-26 | Disposition: A | Discharge status: Home / Self Care | Payer: Medicare HMO | Source: Ambulatory Visit | Attending: Orthopedic Surgery | Admitting: Orthopedic Surgery

## 2021-06-26 DIAGNOSIS — M79672 Pain in left foot: Secondary | ICD-10-CM

## 2021-06-26 DIAGNOSIS — M19072 Primary osteoarthritis, left ankle and foot: Secondary | ICD-10-CM | POA: Diagnosis not present

## 2021-06-26 DIAGNOSIS — M7732 Calcaneal spur, left foot: Secondary | ICD-10-CM | POA: Diagnosis not present

## 2021-07-02 DIAGNOSIS — R300 Dysuria: Secondary | ICD-10-CM | POA: Diagnosis not present

## 2021-07-08 DIAGNOSIS — M8588 Other specified disorders of bone density and structure, other site: Secondary | ICD-10-CM | POA: Diagnosis not present

## 2021-07-08 DIAGNOSIS — M79672 Pain in left foot: Secondary | ICD-10-CM | POA: Diagnosis not present

## 2021-07-08 DIAGNOSIS — M85852 Other specified disorders of bone density and structure, left thigh: Secondary | ICD-10-CM | POA: Diagnosis not present

## 2021-07-08 DIAGNOSIS — M85851 Other specified disorders of bone density and structure, right thigh: Secondary | ICD-10-CM | POA: Diagnosis not present

## 2021-07-08 DIAGNOSIS — M19072 Primary osteoarthritis, left ankle and foot: Secondary | ICD-10-CM | POA: Diagnosis not present

## 2021-07-09 DIAGNOSIS — R69 Illness, unspecified: Secondary | ICD-10-CM | POA: Diagnosis not present

## 2021-07-09 DIAGNOSIS — R7303 Prediabetes: Secondary | ICD-10-CM | POA: Diagnosis not present

## 2021-07-09 DIAGNOSIS — I119 Hypertensive heart disease without heart failure: Secondary | ICD-10-CM | POA: Diagnosis not present

## 2021-07-09 DIAGNOSIS — I2510 Atherosclerotic heart disease of native coronary artery without angina pectoris: Secondary | ICD-10-CM | POA: Diagnosis not present

## 2021-07-09 DIAGNOSIS — E782 Mixed hyperlipidemia: Secondary | ICD-10-CM | POA: Diagnosis not present

## 2021-07-09 DIAGNOSIS — M810 Age-related osteoporosis without current pathological fracture: Secondary | ICD-10-CM | POA: Diagnosis not present

## 2021-07-09 DIAGNOSIS — Z0000 Encounter for general adult medical examination without abnormal findings: Secondary | ICD-10-CM | POA: Diagnosis not present

## 2021-07-09 DIAGNOSIS — J309 Allergic rhinitis, unspecified: Secondary | ICD-10-CM | POA: Diagnosis not present

## 2021-07-09 DIAGNOSIS — Z23 Encounter for immunization: Secondary | ICD-10-CM | POA: Diagnosis not present

## 2021-07-09 DIAGNOSIS — E039 Hypothyroidism, unspecified: Secondary | ICD-10-CM | POA: Diagnosis not present

## 2021-07-09 DIAGNOSIS — I6529 Occlusion and stenosis of unspecified carotid artery: Secondary | ICD-10-CM | POA: Diagnosis not present

## 2021-07-09 NOTE — Progress Notes (Unsigned)
Cardiology Office Note:    Date:  07/10/2021   ID:  Vanessa Stewart, DOB 11-05-1945, MRN [id number]  PCP:  Mayra Neer, MD   [facility] Providers Cardiologist:  Candee Furbish, MD     Referring MD: Mayra Neer, MD   Chief Complaint: Follow-up hypertension, hyperlipidemia  History of Present Illness:    Vanessa Stewart is a very pleasant 76 y.o. female with a hx of CAD, carotid artery stenosis, hypertension, RBBB, Prinzmetal's angina, and hyperlipidemia.   Established care with cardiology prior to 2015 and requested a provider switch to Dr. Marlou Porch 03/2020.  She had a low risk Myoview in 2019.   She had cardiac catheterization after abnormal coronary CT scan.  She was noted to have circumflex and LAD moderate nonflow limiting disease on cath assessment FFR, medical management.  She has a moderate carotid artery stenosis on the left, mild on the right  She was last seen in our office on 01/20/2021 by Dr. Marlou Porch at which time her Crestor was increased to 10 mg due to LDL above goal of 70. She called back to report she was already taking 10 mg and dose was increased to 20 mg.  LDL on 05/14/2021 improved to 76 but remained above goal, Zetia 10 mg was added.   Today, she is here alone for follow-up.  She reports she is feeling well and has no specific cardiac concerns.  She is not currently exercising on a consistent basis, has had left foot pain for which she is seeing orthopedics.  Planning to return to water aerobics soon. Admits to eating out on a consistent basis, but tries to monitor sodium. She  denies chest pain, shortness of breath, lower extremity edema, fatigue, palpitations, melena, hematuria, hemoptysis, diaphoresis, weakness, presyncope, syncope, orthopnea, and PND.  Has occasional bilateral hand swelling that she is concerned is associated with starting Zetia.  Past Medical History:  Diagnosis Date   Allergy    Carotid artery stenosis 07/03/2015   1-39% bilateral by   dopplers 07/2015   Depression    Essential tremor    Hyperlipidemia    Hypertension    Hypothyroidism    Osteopenia    Prinzmetal's angina ([facility])    normal coronary arteries with coronary artery vasospasm at time of cath   RBBB    Chronic    Past Surgical History:  Procedure Laterality Date   CARDIAC CATHETERIZATION  12/09   coronary spasm noted on heart cath. No coronary artery disease. LV function normal   colonoscopy  07/07   ganem- 10 Years   INTRAVASCULAR PRESSURE WIRE/FFR STUDY N/A 07/26/2020   Procedure: INTRAVASCULAR PRESSURE WIRE/FFR STUDY;  Surgeon: Leonie Man, MD;  Location: [facility];  Service: Cardiovascular;  Laterality: N/A;   KNEE SURGERY     LEFT HEART CATH AND CORONARY ANGIOGRAPHY N/A 07/26/2020   Procedure: LEFT HEART CATH AND CORONARY ANGIOGRAPHY;  Surgeon: Leonie Man, MD;  Location: [facility];  Service: Cardiovascular;  Laterality: N/A;   THYROIDECTOMY     TOTAL ABDOMINAL HYSTERECTOMY W/ BILATERAL SALPINGOOPHORECTOMY      Current Medications: Current Meds  Medication Sig   alendronate (FOSAMAX) 70 MG tablet Take 70 mg by mouth daily.   ALPRAZolam (XANAX) 0.5 MG tablet Take 0.5 mg by mouth 2 (two) times daily as needed. (anxiety)   aspirin 81 MG tablet Take 81 mg by mouth daily.   CALCIUM CARBONATE PO Take by mouth.   diltiazem (CARDIZEM CD) 180 MG  24 hr capsule Take 180 mg by mouth daily.   ezetimibe (ZETIA) 10 MG tablet Take 1 tablet (10 mg total) by mouth daily.   levothyroxine (SYNTHROID, LEVOTHROID) 50 MCG tablet Take 50 mcg by mouth daily before breakfast.   lisinopril-hydrochlorothiazide (PRINZIDE,ZESTORETIC) 20-25 MG per tablet Take 1 tablet by mouth daily.   Omega-3 Fatty Acids (FISH OIL) 1000 MG CAPS Take 1 capsule by mouth daily.   PARoxetine (PAXIL) 20 MG tablet Take 20 mg by mouth daily.   potassium chloride SA (K-DUR,KLOR-CON) 20 MEQ tablet Take 1 tablet (20 mEq total) by mouth 2 (two) times daily. Please make  overdue appt with Dr. Radford Pax before anymore refills. 1st attempt   rosuvastatin (CRESTOR) 20 MG tablet Take 1 tablet (20 mg total) by mouth daily.     Allergies:   Elemental sulfur, Fosamax [alendronate], Lipitor [atorvastatin], Pravastatin, Septra [sulfamethoxazole-trimethoprim], Sulfa antibiotics, and Zetia [ezetimibe]   Social History   Socioeconomic History   Marital status: Widowed    Spouse name: Not on file   Number of children: Not on file   Years of education: Not on file   Highest education level: Not on file  Occupational History   Not on file  Tobacco Use   Smoking status: Never   Smokeless tobacco: Never  Vaping Use   Vaping Use: Never used  Substance and Sexual Activity   Alcohol use: No    Alcohol/week: 0.0 standard drinks   Drug use: No   Sexual activity: Not on file  Other Topics Concern   Not on file  Social History Narrative   Not on file   Social Determinants of Health   Financial Resource Strain: Not on file  Food Insecurity: Not on file  Transportation Needs: Not on file  Physical Activity: Not on file  Stress: Not on file  Social Connections: Not on file     Family History: The patient's family history includes Heart disease in her brother, brother, mother, and sister; Stroke in her father.  ROS:   Please see the history of present illness.   All other systems reviewed and are negative.  Labs/Other Studies Reviewed:    The following studies were reviewed today:  VAS US Carotid 04/28/21  Right Carotid: Velocities in the right ICA are consistent with a 1-39%  stenosis.   Left Carotid: Velocities in the left distal ICA are consistent with a  40-59%               stenosis (area of tortuosity).   Vertebrals:  Bilateral vertebral arteries demonstrate antegrade flow.  Subclavians: Normal flow hemodynamics were seen in bilateral subclavian               arteries.   LHC 07/26/20  Mid LAD-1 lesion is 30% stenosed. Mid LAD-2 lesion is 60%  stenosed. RFR 0.91 -> FFR 0.87 (Not significant) Mid LAD to Dist LAD lesion is 30% stenosed. Mid Cx lesion is 60% stenosed. RFR 0.93 (Not significant) LV end diastolic pressure is normal.   SUMMARY Moderate Two-Vessel Disease: Both non-Physiologically significant lesions. Mid LAD discrete eccentric, 60-65% stenosis just prior to branching into equal size major 3rd Diag and distal LAD  CT FFR positive, however RFR 0.91, FFR 0.87 -> not physiologically significant Focal "ostial " 60% lesion in the LCx just after 1st Mrg --  RFR negative, 0-.93 Normal RCA Normal LVEDP     RECOMMENDATIONS Aggressive Guideline Directed Medical Management for moderate CAD   Coronary CTA 07/18/20  1. Coronary  artery calicum score 46 Agatston units. This places the patient in the 51st percentile for age and gender, suggesting intermediate risk for future cardiac events.   2. Discrete calcified mid LAD stenosis, hemodynamically significant by FFR (0.57).   The LAD stenosis could be artifactual and Heartflow may be modeling artifact (Flash scan protocol was used). If patient does not have suggestive symptoms of angina, would consider Cardiolite versus coronary CTA using the regular protocol to confirm.   1. Left Main: No significant stenosis.   2. LAD: FFR 0.57 mid LAD. 3. LCX: FFR 0.56 far distal LCx. There is significant artifact on the CTA images in the distal LCx so not sure of significance of this finding. 4. RCA: No significant stenosis.   IMPRESSION: 1. CT FFR analysis suggests hemodynamically significant mid LAD stenosis. However, Flash protocol used with artifact noted in the mid LAD. It is possible that the CT FFR is modeling artifact. If the patient has anginal symptoms, would consider proceeding with cath. If symptoms are not suggestive of angina, consider doing either a Cardiolite or repeat coronary CT by the usual protocol.   Echo 02/01/19   1. Left ventricular ejection fraction,  by visual estimation, is 60 to  65%. The left ventricle has normal function. There is mildly increased  left ventricular hypertrophy.   2. Left ventricular diastolic parameters are indeterminate.   3. The left ventricle has no regional wall motion abnormalities.   4. Global right ventricle has normal systolic function.The right  ventricular size is normal. No increase in right ventricular wall  thickness.   5. Left atrial size was normal.   6. Right atrial size was normal.   7. The mitral valve is normal in structure. Trivial mitral valve  regurgitation. No evidence of mitral stenosis.   8. No significant mitral valve prolapse appreciated.   9. The tricuspid valve is normal in structure.  10. The aortic valve is tricuspid. Aortic valve regurgitation is not  visualized. Mild aortic valve sclerosis without stenosis.  11. The pulmonic valve was normal in structure. Pulmonic valve  regurgitation is trivial.  12. Mildly elevated pulmonary artery systolic pressure.  13. The inferior vena cava is normal in size with greater than 50%  respiratory variability, suggesting right atrial pressure of 3 mmHg.    Exercise Myoview 02/24/17  Nuclear stress EF: 64%. The study is normal. This is a low risk study. The left ventricular ejection fraction is normal (55-65%). Blood pressure demonstrated a normal response to exercise. There was no ST segment deviation noted during stress from baseline EKG changes due to RBBB.   Finalized by Sueanne Margarita, MD on Wed Feb 24, 2017  3:37 PM Nuclear stress EF: 64%. The study is normal. This is a low risk study. The left ventricular ejection fraction is normal (55-65%).  Recent Labs: 10/06/2020: BUN 18; Creatinine, Ser 0.63; Hemoglobin 14.8; Platelets 285; Potassium 3.5; Sodium 133 05/14/2021: ALT 7  Recent Lipid Panel    Component Value Date/Time   CHOL 154 05/14/2021 0836   TRIG 120 05/14/2021 0836   HDL 57 05/14/2021 0836   CHOLHDL 2.7 05/14/2021  0836   CHOLHDL 2.2 11/25/2015 1053   VLDL 19 11/25/2015 1053   LDLCALC 76 05/14/2021 0836   LDLDIRECT 114.0 07/03/2014 1046     Risk Assessment/Calculations:         Physical Exam:    VS:  BP 122/86   Pulse 73   Ht '5\' 3"'$  (1.6 m)   Wt 146  lb (66.2 kg)   SpO2 95%   BMI 25.86 kg/m     Wt Readings from Last 3 Encounters:  07/10/21 146 lb (66.2 kg)  04/28/21 144 lb (65.3 kg)  01/20/21 142 lb (64.4 kg)     GEN:  Well nourished, well developed in no acute distress HEENT: Normal NECK: No JVD; No carotid bruits CARDIAC: RRR, 3/6 murmur RUSB. No rubs, gallops RESPIRATORY:  Clear to auscultation without rales, wheezing or rhonchi  ABDOMEN: Soft, non-tender, non-distended MUSCULOSKELETAL:  No edema; No deformity. 2+ pedal pulses, equal bilaterally SKIN: Warm and dry NEUROLOGIC:  Alert and oriented x 3 PSYCHIATRIC:  Normal affect   EKG:  EKG is not ordered today.   Diagnoses:    1. Murmur   2. Coronary artery disease involving native coronary artery of native heart without angina pectoris   3. Hyperlipidemia LDL goal <70   4. Bilateral carotid artery stenosis   5. Prinzmetal angina ([place])   6. Essential hypertension   7. RBBB    Assessment and Plan:     Murmur: She has a murmur right upper sternal border.  Last echocardiogram 01/2019 revealed mild aortic sclerosis without stenosis, trivial MR. Will repeat echocardiogram for further evaluation of valve function.  CAD without angina: Moderate two-vessel disease both non-physiologically significant lesions, recommendation for aggressive medical therapy. She denies chest pain, dyspnea, or other symptoms concerning for angina.  No indication for further ischemic evaluation at this time. Continue statin, Zetia, aspirin.  Prinzmetal angina: As noted above she denies recent chest pain.  RBBB: EKG not done today.  We are getting echocardiogram in the setting of cardiac murmur and will evaluate LVEF in the setting of bundle branch  block.  Hyperlipidemia LDL goal < 70: She is now on Crestor 20 mg and Zetia 10 mg daily. Thinks Zetia may be causing finger swelling.  I emphasized the importance of LDL less than 70 due to moderate CAD. Continue to monitor symptoms.  Due for repeat lipids 08/20/21.   Hypertension: BP is well controlled. No changes to medications.   Carotid artery disease: Mild stenosis right ICA 1-39%, moderate stenosis left ICA 40-59% by u/s 04/2021. Seen by [facility] with plan to repeat carotid u/s in 1 year. No lightheadedness, presyncope, syncope.    Disposition: 6 months with Dr. Marlou Porch   Medication Adjustments/Labs and Tests Ordered: Current medicines are reviewed at length with the patient today.  Concerns regarding medicines are outlined above.  Orders Placed This Encounter  Procedures   ECHOCARDIOGRAM COMPLETE   No orders of the defined types were placed in this encounter.   Patient Instructions  Medication Instructions:  Your physician recommends that you continue on your current medications as directed. Please refer to the Current Medication list given to you today.  *If you need a refill on your cardiac medications before your next appointment, please call your pharmacy*   Lab Work: None ordered  If you have labs (blood work) drawn today and your tests are completely normal, you will receive your results only by: [place] (if you have MyChart) OR A paper copy in the mail If you have any lab test that is abnormal or we need to change your treatment, we will call you to review the results.   Testing/Procedures: Your physician has requested that you have an echocardiogram. Echocardiography is a painless test that uses sound waves to create images of your heart. It provides your doctor with information about the size and shape of your heart  and how well your heart's chambers and valves are working. This procedure takes approximately one hour. There are no restrictions for this  procedure.    Follow-Up: At [facility], you and your health needs are our priority.  As part of our continuing mission to provide you with exceptional heart care, we have created designated Provider Care Teams.  These Care Teams include your primary Cardiologist (physician) and Advanced Practice Providers (APPs -  Physician Assistants and Nurse Practitioners) who all work together to provide you with the care you need, when you need it.  We recommend signing up for the patient portal called "MyChart".  Sign up information is provided on this After Visit Summary.  MyChart is used to connect with patients for Virtual Visits (Telemedicine).  Patients are able to view lab/test results, encounter notes, upcoming appointments, etc.  Non-urgent messages can be sent to your provider as well.   To learn more about what you can do with MyChart, go to [email or web address].    Your next appointment:   6 month(s)  The format for your next appointment:   In Person  Provider:   Candee Furbish, MD     Other Instructions   Important Information About Sugar         Signed, Emmaline Life, NP  07/10/2021 9:58 AM    [place]

## 2021-07-10 ENCOUNTER — Encounter: Payer: Self-pay | Admitting: Nurse Practitioner

## 2021-07-10 ENCOUNTER — Ambulatory Visit: Payer: Medicare HMO | Admitting: Nurse Practitioner

## 2021-07-10 VITALS — BP 122/86 | HR 73 | Ht 63.0 in | Wt 146.0 lb

## 2021-07-10 DIAGNOSIS — R011 Cardiac murmur, unspecified: Secondary | ICD-10-CM | POA: Diagnosis not present

## 2021-07-10 DIAGNOSIS — I2510 Atherosclerotic heart disease of native coronary artery without angina pectoris: Secondary | ICD-10-CM

## 2021-07-10 DIAGNOSIS — I201 Angina pectoris with documented spasm: Secondary | ICD-10-CM | POA: Diagnosis not present

## 2021-07-10 DIAGNOSIS — E785 Hyperlipidemia, unspecified: Secondary | ICD-10-CM

## 2021-07-10 DIAGNOSIS — I6523 Occlusion and stenosis of bilateral carotid arteries: Secondary | ICD-10-CM | POA: Diagnosis not present

## 2021-07-10 DIAGNOSIS — I4510 Unspecified right bundle-branch block: Secondary | ICD-10-CM | POA: Diagnosis not present

## 2021-07-10 DIAGNOSIS — I10 Essential (primary) hypertension: Secondary | ICD-10-CM

## 2021-07-10 NOTE — Patient Instructions (Signed)
Medication Instructions:  Your physician recommends that you continue on your current medications as directed. Please refer to the Current Medication list given to you today.  *If you need a refill on your cardiac medications before your next appointment, please call your pharmacy*   Lab Work: None ordered  If you have labs (blood work) drawn today and your tests are completely normal, you will receive your results only by: [place] (if you have MyChart) OR A paper copy in the mail If you have any lab test that is abnormal or we need to change your treatment, we will call you to review the results.   Testing/Procedures: Your physician has requested that you have an echocardiogram. Echocardiography is a painless test that uses sound waves to create images of your heart. It provides your doctor with information about the size and shape of your heart and how well your heart's chambers and valves are working. This procedure takes approximately one hour. There are no restrictions for this procedure.    Follow-Up: At [facility], you and your health needs are our priority.  As part of our continuing mission to provide you with exceptional heart care, we have created designated Provider Care Teams.  These Care Teams include your primary Cardiologist (physician) and Advanced Practice Providers (APPs -  Physician Assistants and Nurse Practitioners) who all work together to provide you with the care you need, when you need it.  We recommend signing up for the patient portal called "MyChart".  Sign up information is provided on this After Visit Summary.  MyChart is used to connect with patients for Virtual Visits (Telemedicine).  Patients are able to view lab/test results, encounter notes, upcoming appointments, etc.  Non-urgent messages can be sent to your provider as well.   To learn more about what you can do with MyChart, go to [email or web address].    Your next appointment:   6  month(s)  The format for your next appointment:   In Person  Provider:   Candee Furbish, MD     Other Instructions   Important Information About Sugar

## 2021-07-11 ENCOUNTER — Telehealth: Payer: Self-pay | Admitting: Nurse Practitioner

## 2021-07-11 NOTE — Telephone Encounter (Signed)
Patient calling to clarify why she needs to echo since she just had the cath.

## 2021-07-11 NOTE — Telephone Encounter (Signed)
Returned call to Pt.  Clarified why Pt was ordered an echo.  All questions answered.  Pt indicates understanding and agreement with tx plan.

## 2021-07-21 ENCOUNTER — Telehealth: Payer: Self-pay | Admitting: Cardiology

## 2021-07-21 NOTE — Telephone Encounter (Signed)
Called pt in regards to muscle pain r/t medications.  Pt reports tightness and aches to hands that is unbearable.  Feels that discomfort increased when rosuvastatin was increased from 10 mg to 20 mg PO QD.  Advised pt to stop taking rosuvastatin for 3 days then take rosuvastatin 10 mg PO QD and call in after 1 week to report how pt feels.  Will route to provider to review.

## 2021-07-21 NOTE — Telephone Encounter (Signed)
Pt c/o medication issue:  1. Name of Medication:  ezetimibe (ZETIA) 10 MG tablet rosuvastatin (CRESTOR) 20 MG tablet  2. How are you currently taking this medication (dosage and times per day)? 1 tablet of both daily at night  3. Are you having a reaction (difficulty breathing--STAT)? no  4. What is your medication issue? Patient states her crestor was doubled and she was also put on zetia. She states she has been having muscle pains now.

## 2021-07-22 NOTE — Telephone Encounter (Signed)
Agree with plan. Thank you

## 2021-07-28 ENCOUNTER — Telehealth: Payer: Self-pay | Admitting: Cardiology

## 2021-07-28 NOTE — Telephone Encounter (Signed)
Pain most likely due to rosuvastatin since she has had the same issue with atorvastatin and pravastatin in the past, agree with PCP referral

## 2021-07-28 NOTE — Telephone Encounter (Signed)
Spoke with patient who reports she stopped taking Rosuvastatin for 3 days then restarted on '10mg'$  QD as instructed. She states she has been taking Rosuvastatin '10mg'$  QD for 4 days now and has not noticed any improvement in pain/swelling of hands.   She reports no improvement on the days she wasn't taking the medication at all either. Patient also reports her legs ache, though is unsure of exactly when this started.  Patient states she took '400mg'$  of ibuprofen for pain/swelling to hands/legs with some improvement noted. Patient also reports she uses ice packs at night on her hands and states this also "helps some."  Will forward to Christen Bame, NP and Dr. Marlou Porch to review and advise. Will also forward to Pharm D to review medication list for meds that may cause swelling/pain in hands if not the rosuvastatin.  Informed patient that if stopping rosuvastatin and restarting at lower dose did not help at all this may be related to arthritis and to follow-up with PCP for further evaluation. Patient reports her PCP is already aware and following.

## 2021-07-28 NOTE — Telephone Encounter (Signed)
Pt is calling back stating she is still having issue with this medication. Requesting call back.

## 2021-07-28 NOTE — Telephone Encounter (Signed)
error 

## 2021-07-29 NOTE — Telephone Encounter (Signed)
Spoke with patient and discussed recommendation from Christen Bame, NP.  Per Sharyn Lull: Agree that pain may be statin related. Would recommend she hold indefinitely but also see PCP to discuss possible arthritis that is contributing.   Per Pharmacist Rollen Sox, RPH: Pain most likely due to rosuvastatin since she has had the same issue with atorvastatin and pravastatin in the past, agree with PCP referral  Patient verbalized understanding and agrees with plan above.

## 2021-07-29 NOTE — Telephone Encounter (Signed)
Agree that pain may be statin related. Would recommend she hold indefinitely but also see PCP to discuss possible arthritis that is contributing.

## 2021-08-06 ENCOUNTER — Ambulatory Visit (HOSPITAL_COMMUNITY): Payer: Medicare HMO | Attending: Cardiology

## 2021-08-06 DIAGNOSIS — R011 Cardiac murmur, unspecified: Secondary | ICD-10-CM | POA: Diagnosis not present

## 2021-08-06 LAB — ECHOCARDIOGRAM COMPLETE
Area-P 1/2: 2.9 cm2
S' Lateral: 2.8 cm

## 2021-08-10 ENCOUNTER — Other Ambulatory Visit: Payer: Self-pay

## 2021-08-10 ENCOUNTER — Inpatient Hospital Stay
Admission: AD | Admit: 2021-08-10 | Payer: Medicare HMO | Source: Other Acute Inpatient Hospital | Admitting: Emergency Medicine

## 2021-08-10 ENCOUNTER — Observation Stay (HOSPITAL_BASED_OUTPATIENT_CLINIC_OR_DEPARTMENT_OTHER)
Admission: EM | Admit: 2021-08-10 | Discharge: 2021-08-11 | Disposition: A | Discharge status: Home / Self Care | Payer: Medicare HMO | Attending: Internal Medicine | Admitting: Internal Medicine

## 2021-08-10 ENCOUNTER — Emergency Department (HOSPITAL_BASED_OUTPATIENT_CLINIC_OR_DEPARTMENT_OTHER): Payer: Medicare HMO

## 2021-08-10 ENCOUNTER — Encounter (HOSPITAL_BASED_OUTPATIENT_CLINIC_OR_DEPARTMENT_OTHER): Payer: Self-pay | Admitting: Emergency Medicine

## 2021-08-10 DIAGNOSIS — Z7982 Long term (current) use of aspirin: Secondary | ICD-10-CM | POA: Diagnosis not present

## 2021-08-10 DIAGNOSIS — R001 Bradycardia, unspecified: Secondary | ICD-10-CM | POA: Diagnosis not present

## 2021-08-10 DIAGNOSIS — M542 Cervicalgia: Secondary | ICD-10-CM | POA: Diagnosis not present

## 2021-08-10 DIAGNOSIS — I441 Atrioventricular block, second degree: Principal | ICD-10-CM | POA: Diagnosis present

## 2021-08-10 DIAGNOSIS — Z79899 Other long term (current) drug therapy: Secondary | ICD-10-CM | POA: Insufficient documentation

## 2021-08-10 DIAGNOSIS — I517 Cardiomegaly: Secondary | ICD-10-CM | POA: Diagnosis not present

## 2021-08-10 DIAGNOSIS — I2510 Atherosclerotic heart disease of native coronary artery without angina pectoris: Secondary | ICD-10-CM | POA: Insufficient documentation

## 2021-08-10 DIAGNOSIS — J9811 Atelectasis: Secondary | ICD-10-CM | POA: Diagnosis not present

## 2021-08-10 DIAGNOSIS — I10 Essential (primary) hypertension: Secondary | ICD-10-CM | POA: Insufficient documentation

## 2021-08-10 DIAGNOSIS — I4510 Unspecified right bundle-branch block: Secondary | ICD-10-CM | POA: Diagnosis present

## 2021-08-10 DIAGNOSIS — E039 Hypothyroidism, unspecified: Secondary | ICD-10-CM | POA: Insufficient documentation

## 2021-08-10 DIAGNOSIS — R9431 Abnormal electrocardiogram [ECG] [EKG]: Secondary | ICD-10-CM | POA: Diagnosis not present

## 2021-08-10 LAB — COMPREHENSIVE METABOLIC PANEL
ALT: 12 U/L (ref 0–44)
AST: 15 U/L (ref 15–41)
Albumin: 4.1 g/dL (ref 3.5–5.0)
Alkaline Phosphatase: 51 U/L (ref 38–126)
Anion gap: 10 (ref 5–15)
BUN: 14 mg/dL (ref 8–23)
CO2: 24 mmol/L (ref 22–32)
Calcium: 9.2 mg/dL (ref 8.9–10.3)
Chloride: 102 mmol/L (ref 98–111)
Creatinine, Ser: 0.43 mg/dL — ABNORMAL LOW (ref 0.44–1.00)
GFR, Estimated: >60 mL/min (ref >60)
Glucose, Bld: 158 mg/dL — ABNORMAL HIGH (ref 70–99)
Potassium: 3.5 mmol/L (ref 3.5–5.1)
Sodium: 136 mmol/L (ref 135–145)
Total Bilirubin: 0.6 mg/dL (ref 0.3–1.2)
Total Protein: 6.8 g/dL (ref 6.5–8.1)

## 2021-08-10 LAB — CBC WITH DIFFERENTIAL/PLATELET
Abs Immature Granulocytes: 0.02 10*3/uL (ref 0.00–0.07)
Basophils Absolute: 0.1 10*3/uL (ref 0.0–0.1)
Basophils Relative: 1 %
Eosinophils Absolute: 0.3 10*3/uL (ref 0.0–0.5)
Eosinophils Relative: 4 %
HCT: 40.9 % (ref 36.0–46.0)
Hemoglobin: 14.1 g/dL (ref 12.0–15.0)
Immature Granulocytes: 0 %
Lymphocytes Relative: 29 %
Lymphs Abs: 2 10*3/uL (ref 0.7–4.0)
MCH: 31.2 pg (ref 26.0–34.0)
MCHC: 34.5 g/dL (ref 30.0–36.0)
MCV: 90.5 fL (ref 80.0–100.0)
Monocytes Absolute: 0.5 10*3/uL (ref 0.1–1.0)
Monocytes Relative: 8 %
Neutro Abs: 4.1 10*3/uL (ref 1.7–7.7)
Neutrophils Relative %: 58 %
Platelets: 242 10*3/uL (ref 150–400)
RBC: 4.52 MIL/uL (ref 3.87–5.11)
RDW: 13.2 % (ref 11.5–15.5)
WBC: 7 10*3/uL (ref 4.0–10.5)
nRBC: 0 % (ref 0.0–0.2)

## 2021-08-10 LAB — TROPONIN I (HIGH SENSITIVITY)
Troponin I (High Sensitivity): 10 ng/L (ref <18)
Troponin I (High Sensitivity): 32 ng/L — ABNORMAL HIGH (ref <18)

## 2021-08-10 MED ORDER — ALPRAZOLAM 0.5 MG PO TABS: 0.5000 mg | ORAL_TABLET | Freq: Two times a day (BID) | ORAL | Status: DC | PRN

## 2021-08-10 MED ORDER — ASPIRIN 81 MG PO TBEC
81.0000 mg | DELAYED_RELEASE_TABLET | Freq: Every day | ORAL | Status: DC
  Administered 2021-08-11: 81 mg via ORAL
  Filled 2021-08-10: qty 1

## 2021-08-10 MED ORDER — LEVOTHYROXINE SODIUM 50 MCG PO TABS
50.0000 ug | ORAL_TABLET | Freq: Every day | ORAL | Status: DC
  Administered 2021-08-11: 50 ug via ORAL
  Filled 2021-08-10: qty 1

## 2021-08-10 MED ORDER — ACETAMINOPHEN 325 MG PO TABS: 650.0000 mg | ORAL_TABLET | ORAL | Status: DC | PRN

## 2021-08-10 MED ORDER — ONDANSETRON HCL 4 MG/2ML IJ SOLN: 4.0000 mg | Freq: Four times a day (QID) | INTRAMUSCULAR | Status: DC | PRN

## 2021-08-10 MED ORDER — PAROXETINE HCL 20 MG PO TABS: 20.0000 mg | ORAL_TABLET | Freq: Every day | ORAL | Status: DC

## 2021-08-10 MED ORDER — POTASSIUM CHLORIDE CRYS ER 20 MEQ PO TBCR
20.0000 meq | EXTENDED_RELEASE_TABLET | Freq: Two times a day (BID) | ORAL | Status: DC
  Administered 2021-08-11 (×2): 20 meq via ORAL
  Filled 2021-08-10 (×2): qty 1

## 2021-08-10 NOTE — ED Triage Notes (Signed)
Pt arrives pov, steady gait, with c/o hypertension and bradycardia. Pt  denies shob, denies dizziness HR 33 in triage

## 2021-08-10 NOTE — ED Notes (Signed)
Report given to Cherlynn Kaiser RN - 15 min out

## 2021-08-10 NOTE — Plan of Care (Signed)
  Problem: Education: Goal: Knowledge of General Education information will improve Description: Including pain rating scale, medication(s)/side effects and non-pharmacologic comfort measures Outcome: Progressing   Problem: Health Behavior/Discharge Planning: Goal: Ability to manage health-related needs will improve Outcome: Progressing   Problem: Clinical Measurements: Goal: Will remain free from infection Outcome: Progressing Goal: Diagnostic test results will improve Outcome: Progressing Goal: Respiratory complications will improve Outcome: Not Applicable   Problem: Activity: Goal: Risk for activity intolerance will decrease Outcome: Progressing   Problem: Nutrition: Goal: Adequate nutrition will be maintained Outcome: Progressing   Problem: Coping: Goal: Level of anxiety will decrease Outcome: Progressing   Problem: Elimination: Goal: Will not experience complications related to bowel motility Outcome: Progressing Goal: Will not experience complications related to urinary retention Outcome: Progressing   Problem: Pain Managment: Goal: General experience of comfort will improve Outcome: Progressing   Problem: Safety: Goal: Ability to remain free from injury will improve Outcome: Progressing   Problem: Skin Integrity: Goal: Risk for impaired skin integrity will decrease Outcome: Progressing

## 2021-08-10 NOTE — ED Provider Notes (Signed)
[place] EMERGENCY DEPARTMENT Provider Note   CSN: [id number] Arrival date & time: 08/10/21  1620     History  Chief Complaint  Patient presents with   Hypertension   Bradycardia    Vanessa Stewart is a 76 y.o. female.  HPI    76 year old female with a history of coronary artery disease, carotid artery stenosis, hypertension, right bundle branch block, hyperlipidemia who presents with concern for apple watch finding low heart rate, neck pain.   530 last night heart rate decreased to 40s on watch Started having neck pain back of the neck for the past few days.  No radiation, comes and goes, not associated with  No lightheadness, dizziness, chest pain, nausea, vomiting, abdominal pain, syncope Thought the mediterranean food was related to neck pain> Did have some dizziness when getting out of chair.      Past Medical History:  Diagnosis Date   Allergy    Carotid artery stenosis 07/03/2015   1-39% bilateral by  dopplers 07/2015   Depression    Essential tremor    Hyperlipidemia    Hypertension    Hypothyroidism    Osteopenia    Prinzmetal's angina (HCC)    normal coronary arteries with coronary artery vasospasm at time of cath   RBBB    Chronic     Home Medications Prior to Admission medications   Medication Sig Start Date End Date Taking? Authorizing Provider  ALPRAZolam Duanne Moron) 0.5 MG tablet Take 0.5 mg by mouth 2 (two) times daily as needed. (anxiety) 06/10/15   [provider]  aspirin 81 MG tablet Take 81 mg by mouth daily.    [provider]  CALCIUM CARBONATE PO Take by mouth.    [provider]  diltiazem (CARDIZEM CD) 180 MG 24 hr capsule Take 180 mg by mouth daily.    [provider]  ezetimibe (ZETIA) 10 MG tablet Take 1 tablet (10 mg total) by mouth daily. 05/16/21   Jerline Pain, MD  levothyroxine (SYNTHROID, LEVOTHROID) 50 MCG tablet Take 50 mcg by mouth daily before breakfast. 07/31/13   [provider]  lisinopril-hydrochlorothiazide (PRINZIDE,ZESTORETIC) 20-25 MG per tablet Take 1 tablet by mouth daily.    [provider]  Omega-3 Fatty Acids (FISH OIL) 1000 MG CAPS Take 1 capsule by mouth daily.    [provider]  PARoxetine (PAXIL) 20 MG tablet Take 20 mg by mouth daily.    [provider]  potassium chloride SA (K-DUR,KLOR-CON) 20 MEQ tablet Take 1 tablet (20 mEq total) by mouth 2 (two) times daily. Please make overdue appt with Dr. Radford Pax before anymore refills. 1st attempt Patient taking differently: Take 20 mEq by mouth 2 (two) times daily. 04/05/18   Sueanne Margarita, MD  risedronate (ACTONEL) 35 MG tablet Take 35 mg by mouth once a week. 07/19/21   [provider]  rosuvastatin (CRESTOR) 20 MG tablet Take 20 mg by mouth daily. 08/06/21   [provider]      Allergies    Elemental sulfur, Fosamax [alendronate], Lipitor [atorvastatin], Pravachol [pravastatin], Septra [sulfamethoxazole-trimethoprim], Sulfa antibiotics, and Zetia [ezetimibe]    Review of Systems   Review of Systems  Physical Exam Updated Vital Signs BP 136/70   Pulse 60   Temp 98.4 F (36.9 C) (Oral)   Resp 20   Ht '5\' 2"'$  (1.575 m)   Wt 64.8 kg   SpO2 91%   BMI 26.13 kg/m  Physical Exam Vitals and nursing note  reviewed.  Constitutional:      General: She is not in acute distress.    Appearance: She is well-developed. She is not diaphoretic.  HENT:     Head: Normocephalic and atraumatic.  Eyes:     Conjunctiva/sclera: Conjunctivae normal.  Cardiovascular:     Rate and Rhythm: Regular rhythm. Bradycardia present.     Heart sounds: Normal heart sounds. No murmur heard.    No friction rub. No gallop.  Pulmonary:     Effort: Pulmonary effort is normal. No respiratory distress.     Breath sounds: Normal breath sounds. No wheezing or rales.  Abdominal:     General: There is no distension.     Palpations: Abdomen is soft.     Tenderness: There is  no abdominal tenderness. There is no guarding.  Musculoskeletal:        General: No tenderness.     Cervical back: Normal range of motion.  Skin:    General: Skin is warm and dry.     Findings: No erythema or rash.  Neurological:     Mental Status: She is alert and oriented to person, place, and time.     ED Results / Procedures / Treatments   Labs (all labs ordered are listed, but only abnormal results are displayed) Labs Reviewed  COMPREHENSIVE METABOLIC PANEL - Abnormal; Notable for the following components:      Result Value   Glucose, Bld 158 (*)    Creatinine, Ser 0.43 (*)    All other components within normal limits  BASIC METABOLIC PANEL - Abnormal; Notable for the following components:   Potassium 3.0 (*)    Glucose, Bld 103 (*)    Calcium 8.8 (*)    All other components within normal limits  TROPONIN I (HIGH SENSITIVITY) - Abnormal; Notable for the following components:   Troponin I (High Sensitivity) 32 (*)    All other components within normal limits  CBC WITH DIFFERENTIAL/PLATELET  PROTIME-INR  CBC  TROPONIN I (HIGH SENSITIVITY)    EKG None  Radiology DG Chest Portable 1 View  Result Date: 08/10/2021 CLINICAL DATA:  Hypertension and bradycardia. EXAM: PORTABLE CHEST 1 VIEW COMPARISON:  Chest radiograph 10/06/2020, cardiac CT 07/17/2020 FINDINGS: Mild cardiomegaly. Stable mediastinal contours. Hilar prominence corresponds to go prominent pulmonary vasculature on prior CT. No pulmonary edema, pleural effusion, pneumothorax. Subsegmental atelectasis and/or scar at the lung bases. Surgical clips at the left thoracic inlet. No acute osseous abnormalities are seen. IMPRESSION: Mild cardiomegaly. Subsegmental bibasilar atelectasis and/or scar. Electronically Signed   By: Keith Rake M.D.   On: 08/10/2021 17:21    Procedures .Critical Care  Performed by: Gareth Morgan, MD Authorized by: Gareth Morgan, MD   Critical care provider statement:    Critical  care time (minutes):  30   Critical care was time spent personally by me on the following activities:  Development of treatment plan with patient or surrogate, discussions with consultants, evaluation of patient's response to treatment, examination of patient, ordering and review of laboratory studies, ordering and review of radiographic studies, ordering and performing treatments and interventions, pulse oximetry, re-evaluation of patient's condition and review of old charts     Medications Ordered in ED Medications  acetaminophen (TYLENOL) tablet 650 mg (has no administration in time range)  ondansetron (ZOFRAN) injection 4 mg (has no administration in time range)  aspirin EC tablet 81 mg (81 mg Oral Given 08/11/21 1015)  PARoxetine (PAXIL) tablet 20 mg (20 mg Oral Patient Refused/Not Given  08/11/21 0924)  ALPRAZolam Duanne Moron) tablet 0.5 mg (has no administration in time range)  levothyroxine (SYNTHROID) tablet 50 mcg (50 mcg Oral Given 08/11/21 0922)  potassium chloride SA (KLOR-CON M) CR tablet 20 mEq (20 mEq Oral Given 08/11/21 1015)    ED Course/ Medical Decision Making/ A&P                            76 year old female with a history of coronary artery disease, carotid artery stenosis, hypertension, right bundle branch block, hyperlipidemia who presents with concern for apple watch finding low heart rate, neck pain.  ECG personally evaluated and interpreted by me concerning for Mobitz type 2 AV block.  Pacer pads placed.  She is hemodynamically stable.   Labs without hyperkalemia. Initial troponin negative, no sign of MI.    Consulted with Dr. Humphrey Rolls [facility]. Admitted to Cardiology for further care.         Final Clinical Impression(s) / ED Diagnoses Final diagnoses:  Mobitz type 2 second degree heart block  Neck pain    Rx / DC Orders ED Discharge Orders     None         Gareth Morgan, MD 08/11/21 1021

## 2021-08-10 NOTE — ED Notes (Signed)
[facility] sent to lab

## 2021-08-10 NOTE — H&P (Signed)
Cardiology Admission History and Physical:   Patient ID: Vanessa Stewart MRN: [id number]; DOB: [birth year]   Admission date: 08/10/2021  PCP:  Mayra Neer, MD   [facility] Providers Cardiologist:  Candee Furbish, MD        Chief Complaint:  Low Heart rate  Patient Profile:   Vanessa Stewart is a 76 y.o. female with pmh sx for coronary artery disease, carotid artery stenosis, hypertension, right bundle branch block, hyperlipidemia who is being seen 08/10/2021 for the evaluation of 2nd degree AV block.  History of Present Illness:   Vanessa Stewart is a 76 y.o. female with pmh sx for coronary artery disease, carotid artery stenosis, hypertension, right bundle branch block, hyperlipidemia who is being seen 08/10/2021 for the evaluation of 2nd degree AV block. She was in usual state of health till yesterday evening when she noticed on apple watch that her HR is in 57s. Had mild neck pain- but otherwise no symptoms. Hence she came to the ED and was found to have 2nd degree AV block hence transferred here. No lightheadness, dizziness, chest pain, nausea, vomiting, abdominal pain, syncope. HD stable. She has hx of Scircumflex and LAD moderate nonflow limiting disease on cath assessment FFR, medical management.  She has a moderate carotid artery stenosis on the left, mild on the right. She  denies chest pain, shortness of breath, lower extremity edema, fatigue, palpitations, melena, hematuria, hemoptysis, diaphoresis, weakness, presyncope, syncope, orthopnea, and PND.   Past Medical History:  Diagnosis Date   Allergy    Carotid artery stenosis 07/03/2015   1-39% bilateral by  dopplers 07/2015   Depression    Essential tremor    Hyperlipidemia    Hypertension    Hypothyroidism    Osteopenia    Prinzmetal's angina (HCC)    normal coronary arteries with coronary artery vasospasm at time of cath   RBBB    Chronic    Past Surgical History:  Procedure Laterality Date   CARDIAC CATHETERIZATION   12/09   coronary spasm noted on heart cath. No coronary artery disease. LV function normal   colonoscopy  07/07   ganem- 10 Years   INTRAVASCULAR PRESSURE WIRE/FFR STUDY N/A 07/26/2020   Procedure: INTRAVASCULAR PRESSURE WIRE/FFR STUDY;  Surgeon: Leonie Man, MD;  Location: [facility];  Service: Cardiovascular;  Laterality: N/A;   KNEE SURGERY     LEFT HEART CATH AND CORONARY ANGIOGRAPHY N/A 07/26/2020   Procedure: LEFT HEART CATH AND CORONARY ANGIOGRAPHY;  Surgeon: Leonie Man, MD;  Location: [facility];  Service: Cardiovascular;  Laterality: N/A;   THYROIDECTOMY     TOTAL ABDOMINAL HYSTERECTOMY W/ BILATERAL SALPINGOOPHORECTOMY       Medications Prior to Admission: Prior to Admission medications   Medication Sig Start Date End Date Taking? Authorizing Provider  alendronate (FOSAMAX) 70 MG tablet Take 70 mg by mouth daily. 06/25/20   [provider]  ALPRAZolam Duanne Moron) 0.5 MG tablet Take 0.5 mg by mouth 2 (two) times daily as needed. (anxiety) 06/10/15   [provider]  aspirin 81 MG tablet Take 81 mg by mouth daily.    [provider]  CALCIUM CARBONATE PO Take by mouth.    [provider]  diltiazem (CARDIZEM CD) 180 MG 24 hr capsule Take 180 mg by mouth daily.    [provider]  ezetimibe (ZETIA) 10 MG tablet Take 1 tablet (10 mg total) by mouth daily. 05/16/21   Jerline Pain, MD  levothyroxine (  SYNTHROID, LEVOTHROID) 50 MCG tablet Take 50 mcg by mouth daily before breakfast. 07/31/13   [provider]  lisinopril-hydrochlorothiazide (PRINZIDE,ZESTORETIC) 20-25 MG per tablet Take 1 tablet by mouth daily.    [provider]  Omega-3 Fatty Acids (FISH OIL) 1000 MG CAPS Take 1 capsule by mouth daily.    [provider]  PARoxetine (PAXIL) 20 MG tablet Take 20 mg by mouth daily.    [provider]  potassium chloride SA (K-DUR,KLOR-CON) 20 MEQ tablet Take 1 tablet (20 mEq total) by mouth 2  (two) times daily. Please make overdue appt with Dr. Radford Pax before anymore refills. 1st attempt 04/05/18   Sueanne Margarita, MD     Allergies:    Allergies  Allergen Reactions   Elemental Sulfur Other (See Comments)    GI Issues   Fosamax [Alendronate] Other (See Comments)    Jaw pain    Lipitor [Atorvastatin] Other (See Comments)    Aches and pains    Pravastatin Other (See Comments)    Aches and pains    Septra [Sulfamethoxazole-Trimethoprim] Other (See Comments)    GI issues   Sulfa Antibiotics Nausea And Vomiting    GI issues   Zetia [Ezetimibe] Other (See Comments)    Aches and pains     Social History:   Social History   Socioeconomic History   Marital status: Widowed    Spouse name: Not on file   Number of children: Not on file   Years of education: Not on file   Highest education level: Not on file  Occupational History   Not on file  Tobacco Use   Smoking status: Never   Smokeless tobacco: Never  Vaping Use   Vaping Use: Never used  Substance and Sexual Activity   Alcohol use: No    Alcohol/week: 0.0 standard drinks of alcohol   Drug use: No   Sexual activity: Not on file  Other Topics Concern   Not on file  Social History Narrative   Not on file   Social Determinants of Health   Financial Resource Strain: Not on file  Food Insecurity: Not on file  Transportation Needs: Not on file  Physical Activity: Not on file  Stress: Not on file  Social Connections: Not on file  Intimate Partner Violence: Not on file    Family History:   The patient's family history includes Heart disease in her brother, brother, mother, and sister; Stroke in her father.    ROS:  Please see the history of present illness.  All other ROS reviewed and negative.     Physical Exam/Data:   Vitals:   08/10/21 2058 08/10/21 2100 08/10/21 2104 08/10/21 2200  BP:  (!) 146/60    Pulse:  (!) 52 (!) 52   Resp: 18 14    Temp:    98.5 F (36.9 C)  TempSrc:    Oral  SpO2:  94% 100%    Weight:    64.8 kg  Height:    '5\' 2"'$  (1.575 m)    Intake/Output Summary (Last 24 hours) at 08/10/2021 2343 Last data filed at 08/10/2021 2205 Gross per 24 hour  Intake 250 ml  Output --  Net 250 ml      08/10/2021   10:00 PM 08/10/2021    4:30 PM 07/10/2021    8:55 AM  Last 3 Weights  Weight (lbs) 142 lb 13.7 oz 146 lb 146 lb  Weight (kg) 64.8 kg 66.225 kg 66.225 kg  Body mass index is 26.13 kg/m.  General:  Well nourished, well developed, in no acute distress HEENT: normal Neck: no JVD Vascular: No carotid bruits; Distal pulses 2+ bilaterally   Cardiac:  normal S1, S2; RRR; no murmur  Lungs:  clear to auscultation bilaterally, no wheezing, rhonchi or rales  Abd: soft, nontender, no hepatomegaly  Ext: no edema Musculoskeletal:  No deformities, BUE and BLE strength normal and equal Skin: warm and dry  Neuro:  CNs 2-12 intact, no focal abnormalities noted Psych:  Normal affect    EKG:  The ECG that was done  was personally reviewed and demonstrates 2nd degree AV block  Relevant CV Studies:  VAS US Carotid 04/28/21   Right Carotid: Velocities in the right ICA are consistent with a 1-39%  stenosis.   Left Carotid: Velocities in the left distal ICA are consistent with a  40-59%               stenosis (area of tortuosity).   Vertebrals:  Bilateral vertebral arteries demonstrate antegrade flow.  Subclavians: Normal flow hemodynamics were seen in bilateral subclavian               arteries.    LHC 07/26/20   Mid LAD-1 lesion is 30% stenosed. Mid LAD-2 lesion is 60% stenosed. RFR 0.91 -> FFR 0.87 (Not significant) Mid LAD to Dist LAD lesion is 30% stenosed. Mid Cx lesion is 60% stenosed. RFR 0.93 (Not significant) LV end diastolic pressure is normal.   SUMMARY Moderate Two-Vessel Disease: Both non-Physiologically significant lesions. Mid LAD discrete eccentric, 60-65% stenosis just prior to branching into equal size major 3rd Diag and distal LAD  CT FFR  positive, however RFR 0.91, FFR 0.87 -> not physiologically significant Focal "ostial " 60% lesion in the LCx just after 1st Mrg --  RFR negative, 0-.93 Normal RCA Normal LVEDP     RECOMMENDATIONS Aggressive Guideline Directed Medical Management for moderate CAD     Coronary CTA 07/18/20   1. Coronary artery calicum score 46 Agatston units. This places the patient in the 51st percentile for age and gender, suggesting intermediate risk for future cardiac events.   2. Discrete calcified mid LAD stenosis, hemodynamically significant by FFR (0.57).   The LAD stenosis could be artifactual and Heartflow may be modeling artifact (Flash scan protocol was used). If patient does not have suggestive symptoms of angina, would consider Cardiolite versus coronary CTA using the regular protocol to confirm.     1. Left Main: No significant stenosis.   2. LAD: FFR 0.57 mid LAD. 3. LCX: FFR 0.56 far distal LCx. There is significant artifact on the CTA images in the distal LCx so not sure of significance of this finding. 4. RCA: No significant stenosis.   IMPRESSION: 1. CT FFR analysis suggests hemodynamically significant mid LAD stenosis. However, Flash protocol used with artifact noted in the mid LAD. It is possible that the CT FFR is modeling artifact. If the patient has anginal symptoms, would consider proceeding with cath. If symptoms are not suggestive of angina, consider doing either a Cardiolite or repeat coronary CT by the usual protocol.   Echo 02/01/19    1. Left ventricular ejection fraction, by visual estimation, is 60 to  65%. The left ventricle has normal function. There is mildly increased  left ventricular hypertrophy.   2. Left ventricular diastolic parameters are indeterminate.   3. The left ventricle has no regional wall motion abnormalities.   4. Global right ventricle has normal  systolic function.The right  ventricular size is normal. No increase in right  ventricular wall  thickness.   5. Left atrial size was normal.   6. Right atrial size was normal.   7. The mitral valve is normal in structure. Trivial mitral valve  regurgitation. No evidence of mitral stenosis.   8. No significant mitral valve prolapse appreciated.   9. The tricuspid valve is normal in structure.  10. The aortic valve is tricuspid. Aortic valve regurgitation is not  visualized. Mild aortic valve sclerosis without stenosis.  11. The pulmonic valve was normal in structure. Pulmonic valve  regurgitation is trivial.  12. Mildly elevated pulmonary artery systolic pressure.  13. The inferior vena cava is normal in size with greater than 50%  respiratory variability, suggesting right atrial pressure of 3 mmHg.      Exercise Myoview 02/24/17   Nuclear stress EF: 64%. The study is normal. This is a low risk study. The left ventricular ejection fraction is normal (55-65%). Blood pressure demonstrated a normal response to exercise. There was no ST segment deviation noted during stress from baseline EKG changes due to RBBB.  Laboratory Data:  High Sensitivity Troponin:   Recent Labs  Lab 08/10/21 1659 08/10/21 2229  TROPONINIHS 10 32*      Chemistry Recent Labs  Lab 08/10/21 1659  NA 136  K 3.5  CL 102  CO2 24  GLUCOSE 158*  BUN 14  CREATININE 0.43*  CALCIUM 9.2  GFRNONAA >60  ANIONGAP 10    Recent Labs  Lab 08/10/21 1659  PROT 6.8  ALBUMIN 4.1  AST 15  ALT 12  ALKPHOS 51  BILITOT 0.6   Lipids No results for input(s): "CHOL", "TRIG", "HDL", "LABVLDL", "LDLCALC", "CHOLHDL" in the last 168 hours. Hematology Recent Labs  Lab 08/10/21 1659  WBC 7.0  RBC 4.52  HGB 14.1  HCT 40.9  MCV 90.5  MCH 31.2  MCHC 34.5  RDW 13.2  PLT 242   Thyroid No results for input(s): "TSH", "FREET4" in the last 168 hours. BNPNo results for input(s): "BNP", "PROBNP" in the last 168 hours.  DDimer No results for input(s): "DDIMER" in the last 168  hours.   Radiology/Studies:  DG Chest Portable 1 View  Result Date: 08/10/2021 CLINICAL DATA:  Hypertension and bradycardia. EXAM: PORTABLE CHEST 1 VIEW COMPARISON:  Chest radiograph 10/06/2020, cardiac CT 07/17/2020 FINDINGS: Mild cardiomegaly. Stable mediastinal contours. Hilar prominence corresponds to go prominent pulmonary vasculature on prior CT. No pulmonary edema, pleural effusion, pneumothorax. Subsegmental atelectasis and/or scar at the lung bases. Surgical clips at the left thoracic inlet. No acute osseous abnormalities are seen. IMPRESSION: Mild cardiomegaly. Subsegmental bibasilar atelectasis and/or scar. Electronically Signed   By: Keith Rake M.D.   On: 08/10/2021 17:21     Assessment and Plan:   # 2nd degree AV block -She takes diltiazem- will hold that -Seems like mobitz type 2- will monitor on telemetry -Currently HD stable- will closely monitor with Pads on  -Most likely due to age related fibrosis- has RBBB at baseline. Most likely will need PPM- NPO at midnight for EP eval in AM -Recent Echo shows normal EF. So most likely dual chamber PPM  # CAD # HLD # Carotid stenosis -Moderate two-vessel disease both non-physiologically significant lesions, recommendation for aggressive medical therapy. -C/w aspirin -Has not tolerated zetia or statins. May need to think about PCSK9 inhibitors as outpatient  # HTN: Hold meds for now. Resume later   For questions or updates, please  contact [facility] Please consult [email or web address] for contact info under     Signed, Jaci Lazier, MD  08/10/2021 11:43 PM

## 2021-08-11 ENCOUNTER — Other Ambulatory Visit: Payer: Self-pay | Admitting: Cardiology

## 2021-08-11 ENCOUNTER — Other Ambulatory Visit: Payer: Self-pay

## 2021-08-11 DIAGNOSIS — I441 Atrioventricular block, second degree: Secondary | ICD-10-CM | POA: Diagnosis not present

## 2021-08-11 DIAGNOSIS — E039 Hypothyroidism, unspecified: Secondary | ICD-10-CM | POA: Diagnosis not present

## 2021-08-11 DIAGNOSIS — Z7982 Long term (current) use of aspirin: Secondary | ICD-10-CM | POA: Diagnosis not present

## 2021-08-11 DIAGNOSIS — Z79899 Other long term (current) drug therapy: Secondary | ICD-10-CM | POA: Diagnosis not present

## 2021-08-11 DIAGNOSIS — I10 Essential (primary) hypertension: Secondary | ICD-10-CM | POA: Diagnosis not present

## 2021-08-11 DIAGNOSIS — M542 Cervicalgia: Secondary | ICD-10-CM | POA: Diagnosis not present

## 2021-08-11 DIAGNOSIS — R001 Bradycardia, unspecified: Secondary | ICD-10-CM

## 2021-08-11 DIAGNOSIS — I2510 Atherosclerotic heart disease of native coronary artery without angina pectoris: Secondary | ICD-10-CM | POA: Diagnosis not present

## 2021-08-11 LAB — BASIC METABOLIC PANEL
Anion gap: 8 (ref 5–15)
BUN: 9 mg/dL (ref 8–23)
CO2: 24 mmol/L (ref 22–32)
Calcium: 8.8 mg/dL — ABNORMAL LOW (ref 8.9–10.3)
Chloride: 107 mmol/L (ref 98–111)
Creatinine, Ser: 0.47 mg/dL (ref 0.44–1.00)
GFR, Estimated: >60 mL/min (ref >60)
Glucose, Bld: 103 mg/dL — ABNORMAL HIGH (ref 70–99)
Potassium: 3 mmol/L — ABNORMAL LOW (ref 3.5–5.1)
Sodium: 139 mmol/L (ref 135–145)

## 2021-08-11 LAB — CBC
HCT: 42.2 % (ref 36.0–46.0)
Hemoglobin: 14.2 g/dL (ref 12.0–15.0)
MCH: 30.7 pg (ref 26.0–34.0)
MCHC: 33.6 g/dL (ref 30.0–36.0)
MCV: 91.3 fL (ref 80.0–100.0)
Platelets: 231 10*3/uL (ref 150–400)
RBC: 4.62 MIL/uL (ref 3.87–5.11)
RDW: 13.3 % (ref 11.5–15.5)
WBC: 6.7 10*3/uL (ref 4.0–10.5)
nRBC: 0 % (ref 0.0–0.2)

## 2021-08-11 LAB — PROTIME-INR
INR: 1 (ref 0.8–1.2)
Prothrombin Time: 13 seconds (ref 11.4–15.2)

## 2021-08-11 MED ORDER — AMLODIPINE BESYLATE 2.5 MG PO TABS: 2.5000 mg | ORAL_TABLET | Freq: Every day | ORAL | 11 refills | Status: DC

## 2021-08-11 NOTE — Progress Notes (Signed)
Asymptomatic at rest and with light activity.  Continues to have episodes of second-degree AV block Mobitz type I with both 3: 2 and 2: 1 sequences (heart rate 40-60 bpm). It has only been roughly 32 hours since her last dose of diltiazem sustained-release and this medication is probably still pharmacodynamically active. We will keep her off the diltiazem, add a very low-dose of amlodipine. Plan outpatient 3-day Zio patch monitor and follow-up in clinic. Avoid extreme heat, intense physical exercise, driving, etc.

## 2021-08-11 NOTE — Discharge Summary (Signed)
Discharge Summary    Patient ID: Vanessa Stewart MRN: [id number]; DOB: Apr 11, 1945  Admit date: 08/10/2021 Discharge date: 08/11/2021  PCP:  Mayra Neer, MD   [facility] Providers Cardiologist:  Candee Furbish, MD        Discharge Diagnoses    Principal Problem:   Second degree AV block, Mobitz type I Active Problems:   HTN (hypertension)   RBBB   2nd degree AV block    Diagnostic Studies/Procedures    None this admit  _____________   History of Present Illness     Vanessa Stewart is a 76 y.o. female with hx of CAD, carotid artery stenosis, HTN, RBBB, HLD and presented to ER 08/10/21 for heart block, second degree type I.  The night before admit she noted on her apple watch her HR was in the 40s.  She had mild neck pain otherwise not symptoms.  She was seen in ER and found to have second degree heart block type I with slow HR.  She was transferred to [facility].   No other symptoms.    Hx of cath in 2022 with non obstructive disease.    Echo 2023 done 08/06/21 with EF 60-65%, mild LVH. g1DD.  RV normal.  Mild to mod MR/  her dilt was stopped and she was admitted to eval.     Hospital Course     Consultants: none    Pt was seen and evaluated by Dr. Sallyanne Kuster and only Mobitz 1.  She was asymptomatic.  Will stop dilt and continue other meds.   We will arrange outpt 3 day monitor then follow up with Dr. Marlou Porch or APP.   Labs stable, though K+ 3.0 has been replaced.     Did the patient have an acute coronary syndrome (MI, NSTEMI, STEMI, etc) this admission?:  No                               Did the patient have a percutaneous coronary intervention (stent / angioplasty)?:  No.        The patient will be scheduled for a TOC follow up appointment in 10 days.  A message has been sent to the [facility] and Scheduling Pool at the office where the patient should be seen for follow up.  _____________  Discharge Vitals Blood pressure (!) 166/64, pulse (!) 53, temperature 98.3 F (36.8  C), temperature source Oral, resp. rate 20, height '5\' 2"'$  (1.575 m), weight 64.8 kg, SpO2 95 %.  Filed Weights   08/10/21 1630 08/10/21 2200  Weight: 66.2 kg 64.8 kg    Labs & Radiologic Studies    CBC Recent Labs    08/10/21 1659 08/11/21 0752  WBC 7.0 6.7  NEUTROABS 4.1  --   HGB 14.1 14.2  HCT 40.9 42.2  MCV 90.5 91.3  PLT 242 735   Basic Metabolic Panel Recent Labs    08/10/21 1659 08/11/21 0752  NA 136 139  K 3.5 3.0*  CL 102 107  CO2 24 24  GLUCOSE 158* 103*  BUN 14 9  CREATININE 0.43* 0.47  CALCIUM 9.2 8.8*   Liver Function Tests Recent Labs    08/10/21 1659  AST 15  ALT 12  ALKPHOS 51  BILITOT 0.6  PROT 6.8  ALBUMIN 4.1   No results for input(s): "LIPASE", "AMYLASE" in the last 72 hours. High Sensitivity Troponin:   Recent Labs  Lab 08/10/21  1659 08/10/21 2229  TROPONINIHS 10 32*    BNP Invalid input(s): "POCBNP" D-Dimer No results for input(s): "DDIMER" in the last 72 hours. Hemoglobin A1C No results for input(s): "HGBA1C" in the last 72 hours. Fasting Lipid Panel No results for input(s): "CHOL", "HDL", "LDLCALC", "TRIG", "CHOLHDL", "LDLDIRECT" in the last 72 hours. Thyroid Function Tests No results for input(s): "TSH", "T4TOTAL", "T3FREE", "THYROIDAB" in the last 72 hours.  Invalid input(s): "FREET3" _____________  DG Chest Portable 1 View  Result Date: 08/10/2021 CLINICAL DATA:  Hypertension and bradycardia. EXAM: PORTABLE CHEST 1 VIEW COMPARISON:  Chest radiograph 10/06/2020, cardiac CT 07/17/2020 FINDINGS: Mild cardiomegaly. Stable mediastinal contours. Hilar prominence corresponds to go prominent pulmonary vasculature on prior CT. No pulmonary edema, pleural effusion, pneumothorax. Subsegmental atelectasis and/or scar at the lung bases. Surgical clips at the left thoracic inlet. No acute osseous abnormalities are seen. IMPRESSION: Mild cardiomegaly. Subsegmental bibasilar atelectasis and/or scar. Electronically Signed   By: Keith Rake M.D.   On: 08/10/2021 17:21   ECHOCARDIOGRAM COMPLETE  Result Date: 08/06/2021    ECHOCARDIOGRAM REPORT   Patient Name:   Vanessa Stewart Date of Exam: 08/06/2021 Medical Rec #:  [id number]        Height:       63.0 in Accession #:    [id number]       Weight:       146.0 lb Date of Birth:  11-17-1945        BSA:          1.692 m Patient Age:    21 years         BP:           122/86 mmHg Patient Gender: F                HR:           51 bpm. Exam Location:  [place] Procedure: 2D Echo, Cardiac Doppler and Color Doppler Indications:    R01.1 Murmur  History:        Patient has prior history of Echocardiogram examinations, most                 recent 02/01/2019. CAD, Arrythmias:RBBB; Risk                 Factors:Hypertension and Dyslipidemia. Elevated coronary artery                 calcium score. Carotid artery stenosis. Prediabetes. Mitral                 valve disorder.  Sonographer:    Diamond Nickel RCS Referring Phys: Lanice Schwab SWINYER IMPRESSIONS  1. Left ventricular ejection fraction, by estimation, is 60 to 65%. The left ventricle has normal function. The left ventricle has no regional wall motion abnormalities. The left ventricular internal cavity size was mildly dilated. There is mild left ventricular hypertrophy of the basal-septal segment. Left ventricular diastolic parameters are consistent with Grade I diastolic dysfunction (impaired relaxation).  2. Right ventricular systolic function is normal. The right ventricular size is normal. There is normal pulmonary artery systolic pressure.  3. Left atrial size was moderately dilated.  4. The mitral valve is normal in structure. Mild to moderate mitral valve regurgitation. No evidence of mitral stenosis.  5. The aortic valve is tricuspid. Aortic valve regurgitation is not visualized. Aortic valve sclerosis/calcification is present, without any evidence of aortic stenosis.  6. The inferior vena cava is normal in size with  greater than 50%  respiratory variability, suggesting right atrial pressure of 3 mmHg. FINDINGS  Left Ventricle: Left ventricular ejection fraction, by estimation, is 60 to 65%. The left ventricle has normal function. The left ventricle has no regional wall motion abnormalities. The left ventricular internal cavity size was mildly dilated. There is  mild left ventricular hypertrophy of the basal-septal segment. Left ventricular diastolic parameters are consistent with Grade I diastolic dysfunction (impaired relaxation). Normal left ventricular filling pressure. Right Ventricle: The right ventricular size is normal. No increase in right ventricular wall thickness. Right ventricular systolic function is normal. There is normal pulmonary artery systolic pressure. The tricuspid regurgitant velocity is 2.58 m/s, and  with an assumed right atrial pressure of 3 mmHg, the estimated right ventricular systolic pressure is 51.7 mmHg. Left Atrium: Left atrial size was moderately dilated. Right Atrium: Right atrial size was normal in size. Pericardium: There is no evidence of pericardial effusion. Mitral Valve: The mitral valve is normal in structure. Mild mitral annular calcification. Mild to moderate mitral valve regurgitation. No evidence of mitral valve stenosis. Tricuspid Valve: The tricuspid valve is normal in structure. Tricuspid valve regurgitation is trivial. No evidence of tricuspid stenosis. Aortic Valve: The aortic valve is tricuspid. Aortic valve regurgitation is not visualized. Aortic valve sclerosis/calcification is present, without any evidence of aortic stenosis. Pulmonic Valve: The pulmonic valve was normal in structure. Pulmonic valve regurgitation is mild. No evidence of pulmonic stenosis. Aorta: The aortic root is normal in size and structure. Venous: The inferior vena cava is normal in size with greater than 50% respiratory variability, suggesting right atrial pressure of 3 mmHg. IAS/Shunts: No atrial level shunt detected by  color flow Doppler.  LEFT VENTRICLE PLAX 2D LVIDd:         5.50 cm   Diastology LVIDs:         2.80 cm   LV e' medial:    9.57 cm/s LV PW:         0.90 cm   LV E/e' medial:  7.1 LV IVS:        1.20 cm   LV e' lateral:   9.36 cm/s LVOT diam:     2.00 cm   LV E/e' lateral: 7.3 LV SV:         75 LV SV Index:   44 LVOT Area:     3.14 cm  RIGHT VENTRICLE RV Basal diam:  2.70 cm RV S prime:     9.03 cm/s TAPSE (M-mode): 2.8 cm RVSP:           29.6 mmHg LEFT ATRIUM             Index        RIGHT ATRIUM           Index LA diam:        4.10 cm 2.42 cm/m   RA Pressure: 3.00 mmHg LA Vol (A2C):   62.3 ml 36.83 ml/m  RA Area:     10.10 cm LA Vol (A4C):   82.3 ml 48.65 ml/m  RA Volume:   15.30 ml  9.04 ml/m LA Biplane Vol: 74.7 ml 44.16 ml/m  AORTIC VALVE LVOT Vmax:   101.00 cm/s LVOT Vmean:  63.400 cm/s LVOT VTI:    0.238 m  AORTA Ao Root diam: 2.70 cm Ao Asc diam:  3.30 cm MITRAL VALVE               TRICUSPID VALVE MV Area (PHT): 2.90 cm  TR Peak grad:   26.6 mmHg MV Decel Time: 262 msec    TR Vmax:        258.00 cm/s MV E velocity: 68.10 cm/s  Estimated RAP:  3.00 mmHg MV A velocity: 92.80 cm/s  RVSP:           29.6 mmHg MV E/A ratio:  0.73                            SHUNTS                            Systemic VTI:  0.24 m                            Systemic Diam: 2.00 cm Fransico Him MD Electronically signed by Fransico Him MD Signature Date/Time: 08/06/2021/10:56:28 AM    Final     Disposition   Pt is being discharged home today in good condition.  Follow-up Plans & Appointments   Avoid extreme heat, intense physical exercise, driving, etc.        Heart Healthy Diet  Our office will arrange a monitor for you to wear.  They should call in next 3-4 days.    Follow-up Information     Jerline Pain, MD .   Specialty: Cardiology Why: the office will call with date and time Contact information: [address] [address] [phone]         Mayra Neer, MD Follow  up.   Specialty: Family Medicine Why: The office will call patient. Contact information: [address] [place] [address] [phone]                  Discharge Medications   Allergies as of 08/11/2021       Reactions   Elemental Sulfur Other (See Comments)   GI Issues   Fosamax [alendronate] Other (See Comments)   Jaw pain   Lipitor [atorvastatin] Other (See Comments)   Myalgias   Pravachol [pravastatin] Other (See Comments)   Myalgias   Septra [sulfamethoxazole-trimethoprim] Other (See Comments)   GI issues   Sulfa Antibiotics Nausea And Vomiting   GI issues   Zetia [ezetimibe] Other (See Comments)   Myalgias   Crestor [rosuvastatin] Swelling, Other (See Comments)   Myalgias         Medication List     STOP taking these medications    diltiazem 180 MG 24 hr capsule Commonly known as: CARDIZEM CD   rosuvastatin 20 MG tablet Commonly known as: CRESTOR       TAKE these medications    ALPRAZolam 0.5 MG tablet Commonly known as: XANAX Take 0.5 mg by mouth 2 (two) times daily as needed. (anxiety)   amLODipine 2.5 MG tablet Commonly known as: NORVASC Take 1 tablet (2.5 mg total) by mouth daily.   aspirin 81 MG tablet Take 81 mg by mouth daily.   CALCIUM-VITAMIN D3 PO Take 1 tablet by mouth daily.   cetirizine 10 MG tablet Commonly known as: ZYRTEC Take 10 mg by mouth daily.   ezetimibe 10 MG tablet Commonly known as: ZETIA Take 1 tablet (10 mg total) by mouth daily.   FISH OIL PO Take 1 capsule by mouth daily.   levothyroxine 50 MCG tablet Commonly known as: SYNTHROID Take 50 mcg by mouth daily  before breakfast.   lisinopril-hydrochlorothiazide 20-25 MG tablet Commonly known as: ZESTORETIC Take 1 tablet by mouth daily.   PARoxetine 20 MG tablet Commonly known as: PAXIL Take 20 mg by mouth daily.   potassium chloride SA 20 MEQ tablet Commonly known as: KLOR-CON M Take 1 tablet (20 mEq total) by mouth 2 (two)  times daily. Please make overdue appt with Dr. Radford Pax before anymore refills. 1st attempt What changed: additional instructions   risedronate 35 MG tablet Commonly known as: ACTONEL Take 35 mg by mouth every Friday.           Outstanding Labs/Studies     Duration of Discharge Encounter   Greater than 30 minutes including physician time.  Signed, Cecilie Kicks, NP 08/11/2021, 6:54 PM

## 2021-08-11 NOTE — Care Management CC44 (Signed)
Condition Code 44 Documentation Completed  Patient Details  Name: Vanessa Stewart MRN: [id number] Date of Birth: 1945/04/02   Condition Code 44 given:  Yes Patient signature on Condition Code 44 notice:  Yes Documentation of 2 MD's agreement:  Yes Code 44 added to claim:  Yes    Laurena Slimmer, RN 08/11/2021, 6:26 PM

## 2021-08-11 NOTE — Progress Notes (Signed)
Progress Note  Patient Name: Vanessa Stewart Date of Encounter: 08/11/2021  [facility] Cardiologist: Candee Furbish, MD   Subjective   Asymptomatic overnight. ECG and telemetry clearly shows second-degree atrial ventricular block Mobitz type I (sometimes 2: 1 cycles, more frequently 3: 2 cycles) with ventricular rates 40-55 bpm, occasional periods of normal 1: 1 conduction.  Currently in 3: 2 AV block. Last dose of diltiazem sustained-release was roughly 24 hours ago.  Inpatient Medications    Scheduled Meds:  aspirin EC  81 mg Oral Daily   levothyroxine  50 mcg Oral QAC breakfast   PARoxetine  20 mg Oral Daily   potassium chloride SA  20 mEq Oral BID   Continuous Infusions:  PRN Meds: acetaminophen, ALPRAZolam, ondansetron (ZOFRAN) IV   Vital Signs    Vitals:   08/10/21 2200 08/11/21 0035 08/11/21 0355 08/11/21 0729  BP:  (!) 141/59  136/70  Pulse:  (!) 41  60  Resp:  17  20  Temp: 98.5 F (36.9 C) 97.8 F (36.6 C) 98.2 F (36.8 C) 98.4 F (36.9 C)  TempSrc: Oral Oral Oral Oral  SpO2:  91%  91%  Weight: 64.8 kg     Height: '5\' 2"'$  (1.575 m)       Intake/Output Summary (Last 24 hours) at 08/11/2021 0929 Last data filed at 08/10/2021 2205 Gross per 24 hour  Intake 250 ml  Output --  Net 250 ml      08/10/2021   10:00 PM 08/10/2021    4:30 PM 07/10/2021    8:55 AM  Last 3 Weights  Weight (lbs) 142 lb 13.7 oz 146 lb 146 lb  Weight (kg) 64.8 kg 66.225 kg 66.225 kg      Telemetry    Sinus rhythm with second-degree atrioventricular block, Mobitz type I- Personally Reviewed  ECG    Sinus rhythm with second-degree intraventricular block Mobitz type I RBBB (chronic)- Personally Reviewed  Physical Exam  Appears healthy and very comfortable lying fully flat in bed GEN: No acute distress.   Neck: No JVD Cardiac: Irregular grouped beating, 2/6 systolic ejection murmur is heard only after longer diastolic periods, no diastolic murmurs, rubs, or gallops.   Respiratory: Clear to auscultation bilaterally. GI: Soft, nontender, non-distended  MS: No edema; No deformity. Neuro:  Nonfocal  Psych: Normal affect   Labs    High Sensitivity Troponin:   Recent Labs  Lab 08/10/21 1659 08/10/21 2229  TROPONINIHS 10 32*     Chemistry Recent Labs  Lab 08/10/21 1659 08/11/21 0752  NA 136 139  K 3.5 3.0*  CL 102 107  CO2 24 24  GLUCOSE 158* 103*  BUN 14 9  CREATININE 0.43* 0.47  CALCIUM 9.2 8.8*  PROT 6.8  --   ALBUMIN 4.1  --   AST 15  --   ALT 12  --   ALKPHOS 51  --   BILITOT 0.6  --   GFRNONAA >60 >60  ANIONGAP 10 8    Lipids No results for input(s): "CHOL", "TRIG", "HDL", "LABVLDL", "LDLCALC", "CHOLHDL" in the last 168 hours.  Hematology Recent Labs  Lab 08/10/21 1659 08/11/21 0752  WBC 7.0 6.7  RBC 4.52 4.62  HGB 14.1 14.2  HCT 40.9 42.2  MCV 90.5 91.3  MCH 31.2 30.7  MCHC 34.5 33.6  RDW 13.2 13.3  PLT 242 231   Thyroid No results for input(s): "TSH", "FREET4" in the last 168 hours.  BNPNo results for input(s): "BNP", "PROBNP" in the  last 168 hours.  DDimer No results for input(s): "DDIMER" in the last 168 hours.   Radiology    DG Chest Portable 1 View  Result Date: 08/10/2021 CLINICAL DATA:  Hypertension and bradycardia. EXAM: PORTABLE CHEST 1 VIEW COMPARISON:  Chest radiograph 10/06/2020, cardiac CT 07/17/2020 FINDINGS: Mild cardiomegaly. Stable mediastinal contours. Hilar prominence corresponds to go prominent pulmonary vasculature on prior CT. No pulmonary edema, pleural effusion, pneumothorax. Subsegmental atelectasis and/or scar at the lung bases. Surgical clips at the left thoracic inlet. No acute osseous abnormalities are seen. IMPRESSION: Mild cardiomegaly. Subsegmental bibasilar atelectasis and/or scar. Electronically Signed   By: Keith Rake M.D.   On: 08/10/2021 17:21    Cardiac Studies   Echocardiogram 08/06/2021   1. Left ventricular ejection fraction, by estimation, is 60 to 65%. The  left  ventricle has normal function. The left ventricle has no regional  wall motion abnormalities. The left ventricular internal cavity size was  mildly dilated. There is mild left  ventricular hypertrophy of the basal-septal segment. Left ventricular  diastolic parameters are consistent with Grade I diastolic dysfunction  (impaired relaxation).   2. Right ventricular systolic function is normal. The right ventricular  size is normal. There is normal pulmonary artery systolic pressure.   3. Left atrial size was moderately dilated.   4. The mitral valve is normal in structure. Mild to moderate mitral valve  regurgitation. No evidence of mitral stenosis.   5. The aortic valve is tricuspid. Aortic valve regurgitation is not  visualized. Aortic valve sclerosis/calcification is present, without any  evidence of aortic stenosis.   6. The inferior vena cava is normal in size with greater than 50%  respiratory variability, suggesting right atrial pressure of 3 mmHg.    Cardiac catheterization 07/26/2020 Mid LAD-1 lesion is 30% stenosed. Mid LAD-2 lesion is 60% stenosed. RFR 0.91 -> FFR 0.87 (Not significant) Mid LAD to Dist LAD lesion is 30% stenosed. Mid Cx lesion is 60% stenosed. RFR 0.93 (Not significant) LV end diastolic pressure is normal.   SUMMARY Moderate Two-Vessel Disease: Both non-Physiologically significant lesions. Mid LAD discrete eccentric, 60-65% stenosis just prior to branching into equal size major 3rd Diag and distal LAD  CT FFR positive, however RFR 0.91, FFR 0.87 -> not physiologically significant Focal "ostial " 60% lesion in the LCx just after 1st Mrg --  RFR negative, 0-.93 Normal RCA Normal LVEDP     RECOMMENDATIONS Aggressive Guideline Directed Medical Management for moderate CAD Patient Profile     76 y.o. female with hypertension and minor nonobstructive CAD, presents with second-degree intraventricular block Mobitz type I related bradycardia and mild  symptoms  Assessment & Plan    Despite the presence of RBBB, she clearly has super history and block. Diltiazem has been stopped, but its effects will likely persist throughout the day today.  Avoid all AV node blocking medications. No compelling indication for pacemaker yet, unless bradycardia and symptoms persist after discontinuation of diltiazem. If she has longer periods of 1: 1 AV conduction through the end of the day, we will probably send her home with an event monitor.  She also has an Apple watch that detected the arrhythmia in the first place. Continue lisinopril-HCTZ for blood pressure, may increase the dose of lisinopril if necessary for hypertension. Possibly ready for discharge later today.     For questions or updates, please contact [place] Please consult [email or web address] for contact info under        Signed, Sanda Klein, MD  08/11/2021, 9:29 AM

## 2021-08-11 NOTE — Care Management Obs Status (Signed)
[place] NOTIFICATION   Patient Details  Name: Vanessa Stewart MRN: [id number] Date of Birth: December 27, 1945   Medicare Observation Status Notification Given:  Ernesta Amble, RN 08/11/2021, 6:26 PM

## 2021-08-11 NOTE — Discharge Instructions (Signed)
Avoid extreme heat, intense physical exercise, driving, etc.        Heart Healthy Diet  Our office will arrange a monitor for you to wear.  They should call in next 3-4 days.    We stopped your diltiazem and added amlodipine.

## 2021-08-13 ENCOUNTER — Ambulatory Visit (INDEPENDENT_AMBULATORY_CARE_PROVIDER_SITE_OTHER): Payer: Medicare HMO

## 2021-08-13 ENCOUNTER — Telehealth: Payer: Self-pay | Admitting: Cardiology

## 2021-08-13 DIAGNOSIS — R001 Bradycardia, unspecified: Secondary | ICD-10-CM

## 2021-08-13 NOTE — Telephone Encounter (Signed)
Patient contacted regarding discharge from [facility] 08/11/21.  Patient understands to follow up with Sharrell Ku 08/20/21 ar 10:05 am. Patient understands discharge instructions. Patient understands medications and regiment. Patient understands to bring all medications to this visit.

## 2021-08-13 NOTE — Telephone Encounter (Signed)
TOC scheduled for 08/20/21 at 10:05am with Melina Copa, PA per Cecilie Kicks, NP

## 2021-08-13 NOTE — Progress Notes (Unsigned)
Enrolled patient for a 3 day Zio XT monitor to be mailed to patients home   Dr Marlou Porch to read

## 2021-08-14 ENCOUNTER — Telehealth: Payer: Self-pay | Admitting: Cardiology

## 2021-08-14 NOTE — Telephone Encounter (Signed)
Pt calling to follow up as to when she should expect her Heart Monitor in the mail. Please advise

## 2021-08-15 DIAGNOSIS — R001 Bradycardia, unspecified: Secondary | ICD-10-CM

## 2021-08-15 NOTE — Telephone Encounter (Signed)
Returned call to patient she received her monitor today and will apply either later today or tomorrow.

## 2021-08-17 ENCOUNTER — Telehealth: Payer: Self-pay | Admitting: Home Health

## 2021-08-17 NOTE — Telephone Encounter (Signed)
Patient called after-hours line, reporting that she was recently hospitalized for Mobitz type I AV block.  She remained asymptomatic, heart rate was down to 40s.  She was stopped on diltiazem and started on amlodipine 2.5 mg for hypertension.  She reports that her blood pressure today was 123/49, 144/64. AP 40, up to 60s when she was walking, back to 40s when resting.  She denied any symptoms and feels in normal state of health, denied any chest pain, shortness of breath, dizziness, syncope.  Advised patient to continue current medication, continue monitor symptoms of bradycardia, reassured that patient's vital sign is within her baseline.  Patient is currently on ZIO monitor for 72 hours, has a follow-up on 08/20/2021.  She is very anxious and worried that her blood pressure is low, and heart rate is low.  Advised patient that she had similar heart rate during her recent hospitalization, no PPM was indicated, she remains asymptomatic, we will review ZIO monitor findings and evaluate her at follow-up appointment to determine if any intervention needed.  She is agreeable with above plan.  Advised patient to call back if she develop symptoms.

## 2021-08-19 NOTE — Progress Notes (Unsigned)
Cardiology Clinic Note   Patient Name: Vanessa Stewart Date of Encounter: 08/20/2021  Primary Care Provider:  Mayra Neer, MD Primary Cardiologist:  Candee Furbish, MD  Patient Profile     Vanessa Stewart. [name] is a 76 year old female who presents to the clinic today for her transition of care follow up appointment from recent hospital stay on 08/10/2021 for Mobtiz type 1 AV heart block and hypertension.   Past Medical History    Past Medical History:  Diagnosis Date   Allergy    Carotid artery stenosis 07/03/2015   1-39% bilateral by  dopplers 07/2015   Depression    Essential tremor    Hyperlipidemia    Hypertension    Hypothyroidism    Osteopenia    Prinzmetal's angina (HCC)    normal coronary arteries with coronary artery vasospasm at time of cath   RBBB    Chronic   Past Surgical History:  Procedure Laterality Date   CARDIAC CATHETERIZATION  12/09   coronary spasm noted on heart cath. No coronary artery disease. LV function normal   colonoscopy  07/07   ganem- 10 Years   INTRAVASCULAR PRESSURE WIRE/FFR STUDY N/A 07/26/2020   Procedure: INTRAVASCULAR PRESSURE WIRE/FFR STUDY;  Surgeon: Leonie Man, MD;  Location: [facility];  Service: Cardiovascular;  Laterality: N/A;   KNEE SURGERY     LEFT HEART CATH AND CORONARY ANGIOGRAPHY N/A 07/26/2020   Procedure: LEFT HEART CATH AND CORONARY ANGIOGRAPHY;  Surgeon: Leonie Man, MD;  Location: [facility];  Service: Cardiovascular;  Laterality: N/A;   THYROIDECTOMY     TOTAL ABDOMINAL HYSTERECTOMY W/ BILATERAL SALPINGOOPHORECTOMY      Allergies  Allergies  Allergen Reactions   Elemental Sulfur Other (See Comments)    GI Issues   Fosamax [Alendronate] Other (See Comments)    Jaw pain    Lipitor [Atorvastatin] Other (See Comments)    Myalgias    Pravachol [Pravastatin] Other (See Comments)    Myalgias    Septra [Sulfamethoxazole-Trimethoprim] Other (See Comments)    GI issues   Sulfa  Antibiotics Nausea And Vomiting    GI issues   Zetia [Ezetimibe] Other (See Comments)    Myalgias   Crestor [Rosuvastatin] Swelling and Other (See Comments)    Myalgias     History of Present Illness    Vanessa Stewart is a 76 year old female with a PMH of prinzmetal angina, mild-moderate coronary artery disease by cath 07/2020, carotid artery stenosis, right bundle branch block, hypertension, and hyperlipidemia and presented to the emergency department on July 2nd, 2023 after she noted on her Apple Watch that her heart rate was in the 40s the night before. She also had some mild neck pain otherwise she denied any other significant symptoms. She was found to be in a second degree heart block type 1. She was asymptomatic with this. Her potassium was 3.0 and she received potassium replacement in the hospital, otherwise her labs were stable. Her diltiazem was stopped and it was arranged for her to wear a three day monitor and then follow up with cardiology. Her blood pressure was 166 / 64 at discharge and heart rate was 53.  She was started on amlodipine 2.5 mg PO daily for her high blood pressure. 3 day Zio was arranged. She was discharged on 08/13/21. She spoke with Margie Billet, NP on July 9th 2023 and reported improved blood pressure readings at home, her pulse remained in  the 40s when resting and got up to the 60s when she was walking. She denied any symptoms and she denied any shortness of breath, syncope, chest pain, and dizziness. She was told that her ZIO monitor findings would be evaluated at her follow up cardiology appointment to see if any intervention must be done.   She is here today with her daughter. Says she just mailed her Zio monitor yesterday morning and today her Apple watch has not alerted her to any slow heart rates. Prior to today she has been logging her heart rate and blood pressure with her Apple Watch showing her pulse dropping to the 30s with an average in the 40s. Her max HR has  been in the 80s. Says she is checking her blood pressures regularly and they have remained elevated but she does admit to being anxious about her HR and she admits to feeling fatigued. Average SBP readings in the 140s, with a max of 173/91 and a low SBP reading around the 120s. Says she has only had one episode of "heart fluttering" while laying down. Denies any shortness of breath, palpitations, tachycardia, orthopnea/PND, chest pain, or syncope. Does admit to recent nausea which she believes is due to her diverticulitis. Says she stopped taking her previously prescribed Statin medication due to swelling of her hands but is not experiencing any current swelling. She had a lipid panel and ALT drawn before her appointment today. Wants to know if she can take Xanax for her anxiety with her cardiac history.    Home Medications    Current Outpatient Medications  Medication Sig Dispense Refill   ALPRAZolam (XANAX) 0.5 MG tablet Take 0.5 mg by mouth 2 (two) times daily as needed. (anxiety)     amLODipine (NORVASC) 2.5 MG tablet Take 1 tablet (2.5 mg total) by mouth daily. 30 tablet 11   aspirin 81 MG tablet Take 81 mg by mouth daily.     Calcium Carbonate-Vitamin D (CALCIUM-VITAMIN D3 PO) Take 1 tablet by mouth daily.     cetirizine (ZYRTEC) 10 MG tablet Take 10 mg by mouth daily.     ezetimibe (ZETIA) 10 MG tablet Take 1 tablet (10 mg total) by mouth daily. 90 tablet 3   levothyroxine (SYNTHROID, LEVOTHROID) 50 MCG tablet Take 50 mcg by mouth daily before breakfast.     lisinopril-hydrochlorothiazide (PRINZIDE,ZESTORETIC) 20-25 MG per tablet Take 1 tablet by mouth daily.     Omega-3 Fatty Acids (FISH OIL PO) Take 1 capsule by mouth daily.     PARoxetine (PAXIL) 20 MG tablet Take 20 mg by mouth daily.     potassium chloride SA (K-DUR,KLOR-CON) 20 MEQ tablet Take 1 tablet (20 mEq total) by mouth 2 (two) times daily. Please make overdue appt with Dr. Radford Pax before anymore refills. 1st attempt (Patient  taking differently: Take 20 mEq by mouth 2 (two) times daily.) 60 tablet 0   risedronate (ACTONEL) 35 MG tablet Take 35 mg by mouth every Friday.     No current facility-administered medications for this visit.     Family History    Family History  Problem Relation Age of Onset   Heart disease Mother    Stroke Father    Heart disease Sister    Heart disease Brother    Heart disease Brother    She indicated that her mother is deceased. She indicated that her father is deceased. She indicated that her sister is alive. She indicated that her maternal grandmother is deceased. She indicated that  her maternal grandfather is deceased. She indicated that her paternal grandmother is deceased. She indicated that her paternal grandfather is deceased. Says her older sister has had 3 stents placed. FH is also positive for A-fib and CHF.  Social History    Social History   Socioeconomic History   Marital status: Widowed    Spouse name: Not on file   Number of children: Not on file   Years of education: Not on file   Highest education level: Not on file  Occupational History   Not on file  Tobacco Use   Smoking status: Never   Smokeless tobacco: Never  Vaping Use   Vaping Use: Never used  Substance and Sexual Activity   Alcohol use: No    Alcohol/week: 0.0 standard drinks of alcohol   Drug use: No   Sexual activity: Not on file  Other Topics Concern   Not on file  Social History Narrative   Not on file   Social Determinants of Health   Financial Resource Strain: Not on file  Food Insecurity: Not on file  Transportation Needs: Not on file  Physical Activity: Not on file  Stress: Not on file  Social Connections: Not on file  Intimate Partner Violence: Not on file     Review of Systems    Review of Systems  Constitutional:  Positive for malaise/fatigue. Negative for chills, diaphoresis, fever and weight loss.  HENT: Negative.    Eyes: Negative.   Respiratory: Negative.     Cardiovascular:  Negative for chest pain, palpitations, orthopnea, claudication, leg swelling and PND.       Denies palpitations but states she has had only one episode of "heart fluttering" while laying down.   Gastrointestinal:  Positive for nausea. Negative for abdominal pain, blood in stool, constipation, diarrhea, heartburn and vomiting.  Genitourinary: Negative.   Musculoskeletal: Negative.   Skin: Negative.   Neurological: Negative.  Negative for dizziness, tingling, tremors, sensory change, speech change, focal weakness, seizures, loss of consciousness, weakness and headaches.  Endo/Heme/Allergies: Negative.   Psychiatric/Behavioral:  Negative for depression, hallucinations, memory loss, substance abuse and suicidal ideas. The patient is nervous/anxious. The patient does not have insomnia.   All other systems reviewed and are otherwise negative except as noted above.  Physical Exam    VS:  BP (!) 158/98   Pulse 67   Ht '5\' 2"'$  (1.575 m)   Wt 145 lb (65.8 kg)   SpO2 95%   BMI 26.52 kg/m  , BMI Body mass index is 26.52 kg/m.    Blood pressure rechecked during exam and was found to be 142/82  GEN: Well nourished, well developed, in no acute distress. HEENT: normal. Neck: Supple, no JVD, carotid bruits, or masses. Cardiac: RRR, no murmurs, rubs, or gallops. No clubbing, cyanosis, edema.  Radials and PT 2+ and equal bilaterally.  Respiratory:  Respirations regular and unlabored, clear to auscultation bilaterally. GI: Soft, nontender, nondistended. MS: no deformity or atrophy. Skin: warm and dry, no rash. Neuro:  Strength and sensation are intact. Psych: Normal affect.  Accessory Clinical Findings    ECG personally reviewed by me today- 12 lead EKG today revealed NSR with a RBBB, otherwise no other acute changes.   2D Echo on 08/06/2021: LVEF to be between 60 to 65%, left ventricle internal cavity size was mildly dilated, mild LVH of the basal - septal segment, grade one  diastolic dysfunction of the left ventricle, left atrial size was moderately dilated, mild to moderate mitral  valve regurgitation, aortic valve sclerosis/calcification noted. No evidence of aortic stenosis, all other findings normal.  Bilateral Carotid Duplex on 04/28/2021 revealed: Right Carotid: Velocities in the right ICA are consistent with a 1-39% stenosis.  Left Carotid: Velocities in the left distal ICA are consistent with a  40-59% stenosis (area of tortuosity).  Vertebrals:  Bilateral vertebral arteries demonstrate antegrade flow.  Subclavians: Normal flow hemodynamics were seen in bilateral subclavian               arteries.   Cardiac Catheterization on 07/26/2020 revealed: Mid LAD-1 lesion is 30% stenosed. Mid LAD-2 lesion is 60% stenosed. RFR 0.91 -> FFR 0.87 (Not significant) Mid LAD to Dist LAD lesion is 30% stenosed. Mid Cx lesion is 60% stenosed. RFR 0.93 (Not significant) LV end diastolic pressure is normal. SUMMARY Moderate Two-Vessel Disease: Both non-Physiologically significant lesions. Mid LAD discrete eccentric, 60-65% stenosis just prior to branching into equal size major 3rd Diag and distal LAD  CT FFR positive, however RFR 0.91, FFR 0.87 -> not physiologically significant Focal "ostial " 60% lesion in the LCx just after 1st Mrg --  RFR negative, 0-.93 Normal RCA Normal LVEDP RECOMMENDATIONS Aggressive Guideline Directed Medical Management for moderate CAD  Cardiac CTA on 07/17/2020 revealed: IMPRESSION: 1. Coronary artery calicum score 46 Agatston units. This places the patient in the 51st percentile for age and gender, suggesting intermediate risk for future cardiac events. 2. Discrete calcified mid LAD stenosis, hemodynamically significant by FFR (0.57). The LAD stenosis could be artifactual and Heartflow may be modeling artifact (Flash scan protocol was used). If patient does not have suggestive symptoms of angina, would consider Cardiolite versus coronary CTA  using the regular protocol to confirm.  Nuclear Stress Test on 02/24/2017 revealed: Nuclear stress EF: 64%. The study is normal. This is a low risk study. The left ventricular ejection fraction is normal (55-65%). Blood pressure demonstrated a normal response to exercise. There was no ST segment deviation noted during stress from baseline EKG changes due to RBBB. Finalized by Sueanne Margarita, MD on Wed Feb 24, 2017  3:37 PM Nuclear stress EF: 64%. The study is normal. This is a low risk study. The left ventricular ejection fraction is normal (55-65%).  Lab Results  Component Value Date   WBC 6.7 08/11/2021   HGB 14.2 08/11/2021   HCT 42.2 08/11/2021   MCV 91.3 08/11/2021   PLT 231 08/11/2021   Lab Results  Component Value Date   CREATININE 0.47 08/11/2021   BUN 9 08/11/2021   NA 139 08/11/2021   K 3.0 (L) 08/11/2021   CL 107 08/11/2021   CO2 24 08/11/2021   Lab Results  Component Value Date   ALT 12 08/10/2021   AST 15 08/10/2021   ALKPHOS 51 08/10/2021   BILITOT 0.6 08/10/2021   Lab Results  Component Value Date   CHOL 154 05/14/2021   HDL 57 05/14/2021   LDLCALC 76 05/14/2021   LDLDIRECT 114.0 07/03/2014   TRIG 120 05/14/2021   CHOLHDL 2.7 05/14/2021    Lab Results  Component Value Date   HGBA1C 6.3 (H) 01/20/2021    Assessment & Plan    1-2. Mobitz type 1, bradycardia 12 lead EKG today looks good and pt is in NSR, 67 bpm, with a RBBB. Still experiencing heart rate dropping into the 30's prior to today, asymptomatic. Today she states that her Apple watch has not alerted her to her HR dropping. Formal Zio is in process to see what this  represents. We will go ahead and refer her to EP and obtain the following blood work to determine other causes for her recent bradycardia: BMET, Mag, and TSH. Her last TSH from 06/2021 was normal. We are awaiting her Zio monitor to be evaluated and for her results to be released. I told her to continue to log her HR on her Apple  Watch and monitor her symptoms. I told her to let us know if she starts to become symptomatic with a low heart rate to let us know.    3-4. CAD, HLD Hx of CAD with abnormal cardiac CT prompting cath in 07/2020 with moderate 2V CAD, managed medically at that time. No recurrent ischemic symptoms. She had a lipid panel and ALT drawn before her appointment today. We will await these results and see if her LDL is not less than 70 we will need to find another medication for her hyperlipidemia, since she self-discontinued her rosuvastatin. (These labs were originally ordered to f/u the addition of Zetia to statin). Her previous LDL 3 months ago was 76. It appears that she has a history of statin intolerance and she has not tolerated Lipitor or Crestor in the past as these are listed under her allergies. Continue Zetia, aspirin, and Omega 3. She may need to be evaluated by the lipid clinic in the future.  5. Hypertension Her initial BP on exam was 158/98 and a repeat blood pressure check was found to be 142/82. She has also mentioned some elevated readings at home but she also admits to anxiety. Discussed to continue checking her blood pressures at home. I told her we will want to hold off on changing her antihypertensive medication until we get her Zio monitor results and have her seen by EP (as listed in numbers 1 and 2). Permissive HTN is acceptable until we can confirm her bradycardia is stabilized.  6. Carotid artery disease  Bilateral Carotid Duplex on 04/28/2021 revealed Right Carotid 1-39% stenosis and Left Carotid: a  40-59% stenosis. No carotid bruits heard during exam. This is followed by [facility].  7. Anxiety This has been going on when she gets notified about her heart rate dropping on her Apple Watch. She wants to know if it is okay to take her Xanax that her PCP prescribed for her from a cardiac perspective. I told her that Xanax does not affect the heart rate and it is safe to initiate taking her  prescribed Xanax. This anxiety could also be contributing to her high blood pressure readings. Will reevaluate this at the next office visit.   8. Disposition - follow up in the office with me or Dr. Marlou Porch to reevaluate HR and elevated blood pressure readings in 6-8 weeks or sooner if needed. Otherwise refer to EP as above.  Finis Bud, NP 08/20/2021, 10:33 AM

## 2021-08-20 ENCOUNTER — Other Ambulatory Visit: Payer: Medicare HMO

## 2021-08-20 ENCOUNTER — Encounter: Payer: Self-pay | Admitting: Physician Assistant

## 2021-08-20 ENCOUNTER — Ambulatory Visit: Payer: Medicare HMO | Admitting: Nurse Practitioner

## 2021-08-20 VITALS — BP 158/98 | HR 67 | Ht 62.0 in | Wt 145.0 lb

## 2021-08-20 DIAGNOSIS — E785 Hyperlipidemia, unspecified: Secondary | ICD-10-CM

## 2021-08-20 DIAGNOSIS — I10 Essential (primary) hypertension: Secondary | ICD-10-CM

## 2021-08-20 DIAGNOSIS — Z79899 Other long term (current) drug therapy: Secondary | ICD-10-CM | POA: Diagnosis not present

## 2021-08-20 DIAGNOSIS — I2510 Atherosclerotic heart disease of native coronary artery without angina pectoris: Secondary | ICD-10-CM

## 2021-08-20 DIAGNOSIS — I6522 Occlusion and stenosis of left carotid artery: Secondary | ICD-10-CM | POA: Diagnosis not present

## 2021-08-20 DIAGNOSIS — R69 Illness, unspecified: Secondary | ICD-10-CM | POA: Diagnosis not present

## 2021-08-20 DIAGNOSIS — I441 Atrioventricular block, second degree: Secondary | ICD-10-CM

## 2021-08-20 DIAGNOSIS — F419 Anxiety disorder, unspecified: Secondary | ICD-10-CM

## 2021-08-20 DIAGNOSIS — R001 Bradycardia, unspecified: Secondary | ICD-10-CM | POA: Diagnosis not present

## 2021-08-20 LAB — LIPID PANEL
Chol/HDL Ratio: 3.7 ratio (ref 0.0–4.4)
Cholesterol, Total: 205 mg/dL — ABNORMAL HIGH (ref 100–199)
HDL: 56 mg/dL (ref >39)
LDL Chol Calc (NIH): 133 mg/dL — ABNORMAL HIGH (ref 0–99)
Triglycerides: 91 mg/dL (ref 0–149)
VLDL Cholesterol Cal: 16 mg/dL (ref 5–40)

## 2021-08-20 LAB — BASIC METABOLIC PANEL
BUN/Creatinine Ratio: 26 (ref 12–28)
BUN: 13 mg/dL (ref 8–27)
CO2: 27 mmol/L (ref 20–29)
Calcium: 9.5 mg/dL (ref 8.7–10.3)
Chloride: 102 mmol/L (ref 96–106)
Creatinine, Ser: 0.5 mg/dL — ABNORMAL LOW (ref 0.57–1.00)
Glucose: 100 mg/dL — ABNORMAL HIGH (ref 70–99)
Potassium: 3.9 mmol/L (ref 3.5–5.2)
Sodium: 142 mmol/L (ref 134–144)
eGFR: 98 mL/min/{1.73_m2} (ref >59)

## 2021-08-20 LAB — MAGNESIUM: Magnesium: 2.1 mg/dL (ref 1.6–2.3)

## 2021-08-20 LAB — ALT: ALT: 11 IU/L (ref 0–32)

## 2021-08-20 LAB — TSH: TSH: 1.6 u[IU]/mL (ref 0.450–4.500)

## 2021-08-20 NOTE — Addendum Note (Signed)
Addended by: Finis Bud on: 08/20/2021 03:57 PM   Modules accepted: Level of Service

## 2021-08-20 NOTE — Patient Instructions (Addendum)
Medication Instructions:  Your physician recommends that you continue on your current medications as directed. Please refer to the Current Medication list given to you today. *If you need a refill on your cardiac medications before your next appointment, please call your pharmacy*   Lab Work: TODAY-BMET, MAG, TSH If you have labs (blood work) drawn today and your tests are completely normal, you will receive your results only by: [place] (if you have MyChart) OR A paper copy in the mail If you have any lab test that is abnormal or we need to change your treatment, we will call you to review the results.   Testing/Procedures: NONE ORDERED   Follow-Up: At [facility], you and your health needs are our priority.  As part of our continuing mission to provide you with exceptional heart care, we have created designated Provider Care Teams.  These Care Teams include your primary Cardiologist (physician) and Advanced Practice Providers (APPs -  Physician Assistants and Nurse Practitioners) who all work together to provide you with the care you need, when you need it.  We recommend signing up for the patient portal called "MyChart".  Sign up information is provided on this After Visit Summary.  MyChart is used to connect with patients for Virtual Visits (Telemedicine).  Patients are able to view lab/test results, encounter notes, upcoming appointments, etc.  Non-urgent messages can be sent to your provider as well.   To learn more about what you can do with MyChart, go to [email or web address].    Your next appointment:   6-8 week(s)  The format for your next appointment:   In Person  Provider:   ANY APP  or Dr Candee Furbish   Other Instructions You have been referred to EP Dept  Important Information About Sugar

## 2021-08-22 ENCOUNTER — Other Ambulatory Visit: Payer: Self-pay | Admitting: *Deleted

## 2021-08-22 DIAGNOSIS — E785 Hyperlipidemia, unspecified: Secondary | ICD-10-CM

## 2021-08-27 ENCOUNTER — Telehealth: Payer: Self-pay

## 2021-08-27 DIAGNOSIS — R001 Bradycardia, unspecified: Secondary | ICD-10-CM | POA: Diagnosis not present

## 2021-08-27 DIAGNOSIS — I441 Atrioventricular block, second degree: Secondary | ICD-10-CM

## 2021-08-27 NOTE — Telephone Encounter (Signed)
Call received today regarding Zio XT alert results from patient's monitor worn from 08/15/21 to 08/18/21.    The Pt had 49 episodes of AV Block, and 30 sec run of bradycardia ( 33 bpm ) detected at 831 am on 08/16/21, on page 8 of the report.   Pt has a known Mobitz Type 1 Second degree AV Block.    Report results called in were taken to Dr. Acie Fredrickson, [place] 08/27/21 for review.    Since the AV Block is known, the patient has seen a provider, and after reviewing the rhythm strip and time of block occurring in the morning - Dr. Acie Fredrickson recommended forwarding Zio XT information to Dr. Marlou Porch for review.  No critical indications at this time.    Pt called 7/19 to check on today, but not to  discuss the details of the report. ( Pt has an upcoming appointment with a provider to discuss monitor results / care plan.  )  Pt stated she has been feeling great, and had no concerns or complaints at the time of my call.    Will forward an FYI to Dr Marlou Porch for result review.

## 2021-08-29 NOTE — Addendum Note (Signed)
Addended by: Gaetano Net on: 08/29/2021 07:52 AM   Modules accepted: Orders

## 2021-09-08 ENCOUNTER — Encounter: Payer: Self-pay | Admitting: Internal Medicine

## 2021-09-08 ENCOUNTER — Ambulatory Visit: Payer: Medicare HMO | Admitting: Internal Medicine

## 2021-09-08 VITALS — BP 128/70 | HR 84 | Ht 62.0 in | Wt 145.0 lb

## 2021-09-08 DIAGNOSIS — I441 Atrioventricular block, second degree: Secondary | ICD-10-CM

## 2021-09-08 NOTE — Progress Notes (Signed)
ELECTROPHYSIOLOGY CONSULT NOTE  Patient ID: Vanessa Stewart, MRN: [id number], DOB/AGE: 13-Jun-1945 76 y.o. Admit date: (Not on file) Date of Consult: 09/08/2021  Primary Physician: Mayra Neer, MD Primary Cardiologist: MSk     Vanessa Stewart is a 76 y.o. female who is being seen today for the evaluation of bradycardia at the request of MSk.    HPI Vanessa Stewart is a 76 y.o. female seen in consultation regarding 2-1 heart block    Had sought attention for slow heart rates at night  based on her Apple Watch which were unassociated with any symptoms, specifically no lightheadedness no dyspnea--And found in ER have 2 AVB2 and then 2:1 AVB in setting of RBBB  she was observed overnight with the discontinuation of her diltiazemduring hospitalization 7/3and discharged with the use of a.  ZIO >> consecutively dropped p waves but all the bradycardia was at night.  She has sleep disordered breathing, daytime somnolence.  In fact her daughter says that she will need to sleep in the room with her because her snoring is so bad.     Hx of Hypertension DATE TEST EF   6/22 LHC    % 2V CAD non obstructive by FFR   6/23 Echo   60-65 %         Date Cr K Hgb  7/23 0.5 3.9 14.2            Past Medical History:  Diagnosis Date   Allergy    Carotid artery stenosis 07/03/2015   1-39% bilateral by  dopplers 07/2015   Depression    Essential tremor    Hyperlipidemia    Hypertension    Hypothyroidism    Osteopenia    Prinzmetal's angina (HCC)    normal coronary arteries with coronary artery vasospasm at time of cath   RBBB    Chronic      Surgical History:  Past Surgical History:  Procedure Laterality Date   CARDIAC CATHETERIZATION  12/09   coronary spasm noted on heart cath. No coronary artery disease. LV function normal   colonoscopy  07/07   ganem- 10 Years   INTRAVASCULAR PRESSURE WIRE/FFR STUDY N/A 07/26/2020   Procedure: INTRAVASCULAR PRESSURE WIRE/FFR STUDY;   Surgeon: Leonie Man, MD;  Location: [facility];  Service: Cardiovascular;  Laterality: N/A;   KNEE SURGERY     LEFT HEART CATH AND CORONARY ANGIOGRAPHY N/A 07/26/2020   Procedure: LEFT HEART CATH AND CORONARY ANGIOGRAPHY;  Surgeon: Leonie Man, MD;  Location: [facility];  Service: Cardiovascular;  Laterality: N/A;   THYROIDECTOMY     TOTAL ABDOMINAL HYSTERECTOMY W/ BILATERAL SALPINGOOPHORECTOMY       Home Meds: Current Meds  Medication Sig   ALPRAZolam (XANAX) 0.5 MG tablet Take 0.5 mg by mouth 2 (two) times daily as needed. (anxiety)   amLODipine (NORVASC) 2.5 MG tablet Take 1 tablet (2.5 mg total) by mouth daily.   aspirin 81 MG tablet Take 81 mg by mouth daily.   Calcium Carbonate-Vitamin D (CALCIUM-VITAMIN D3 PO) Take 1 tablet by mouth daily.   cetirizine (ZYRTEC) 10 MG tablet Take 10 mg by mouth daily.   ezetimibe (ZETIA) 10 MG tablet Take 1 tablet (10 mg total) by mouth daily.   levothyroxine (SYNTHROID, LEVOTHROID) 50 MCG tablet Take 50 mcg by mouth daily before breakfast.   lisinopril-hydrochlorothiazide (PRINZIDE,ZESTORETIC) 20-25 MG per tablet Take 1 tablet by mouth daily.   Omega-3 Fatty Acids (FISH  OIL PO) Take 1 capsule by mouth daily.   PARoxetine (PAXIL) 20 MG tablet Take 20 mg by mouth daily.   potassium chloride SA (K-DUR,KLOR-CON) 20 MEQ tablet Take 1 tablet (20 mEq total) by mouth 2 (two) times daily. Please make overdue appt with Dr. Radford Pax before anymore refills. 1st attempt (Patient taking differently: Take 20 mEq by mouth 2 (two) times daily.)    Allergies:  Allergies  Allergen Reactions   Elemental Sulfur Other (See Comments)    GI Issues   Fosamax [Alendronate] Other (See Comments)    Jaw pain    Lipitor [Atorvastatin] Other (See Comments)    Myalgias    Pravachol [Pravastatin] Other (See Comments)    Myalgias    Septra [Sulfamethoxazole-Trimethoprim] Other (See Comments)    GI issues   Sulfa Antibiotics Nausea And Vomiting     GI issues   Zetia [Ezetimibe] Other (See Comments)    Myalgias   Crestor [Rosuvastatin] Swelling and Other (See Comments)    Myalgias     Social History   Socioeconomic History   Marital status: Widowed    Spouse name: Not on file   Number of children: Not on file   Years of education: Not on file   Highest education level: Not on file  Occupational History   Not on file  Tobacco Use   Smoking status: Never   Smokeless tobacco: Never  Vaping Use   Vaping Use: Never used  Substance and Sexual Activity   Alcohol use: No    Alcohol/week: 0.0 standard drinks of alcohol   Drug use: No   Sexual activity: Not on file  Other Topics Concern   Not on file  Social History Narrative   Not on file   Social Determinants of Health   Financial Resource Strain: Not on file  Food Insecurity: Not on file  Transportation Needs: Not on file  Physical Activity: Not on file  Stress: Not on file  Social Connections: Not on file  Intimate Partner Violence: Not on file     Family History  Problem Relation Age of Onset   Heart disease Mother    Stroke Father    Heart disease Sister    Heart disease Brother    Heart disease Brother      ROS:  Please see the history of present illness.     All other systems reviewed and negative.    Physical Exam: Blood pressure 128/70, pulse 84, height '5\' 2"'$  (1.575 m), weight 145 lb (65.8 kg). General: Well developed, well nourished female in no acute distress. Head: Normocephalic, atraumatic, sclera non-icteric, no xanthomas, nares are without discharge. EENT: normal  Lymph Nodes:  none Neck: Negative for carotid bruits. JVD not elevated. Back:without scoliosis kyphosis Lungs: Clear bilaterally to auscultation without wheezes, rales, or rhonchi. Breathing is unlabored. Heart: RRR with S1 S2. No   murmur . No rubs, or gallops appreciated. Abdomen: Soft, non-tender, non-distended with normoactive bowel sounds. No hepatomegaly. No  rebound/guarding. No obvious abdominal masses. Msk:  Strength and tone appear normal for age. Extremities: No clubbing or cyanosis. No edema.  Distal pedal pulses are 2+ and equal bilaterally. Skin: Warm and Dry Neuro: Alert and oriented X 3. CN III-XII intact Grossly normal sensory and motor function . Psych:  Responds to questions appropriately with a normal affect.        EKG: 7/13 sinus rhythm with right bundle branch block 7/03 sinus with second-degree AV block type II 7/03 sinus with 2:  1 heart block   Assessment and Plan:  Second-degree AV block type II (I think) and 2: 1 block  Sleep disordered breathing-obstructive sleep apnea  Hypertension     The patient has second-degree heart block in the context of diltiazem.  Monitoring following his discontinuation showed no daytime pauses.  She continues to use her Apple Watch for monitoring.  Her nighttime pauses are supportive of the hypothesis of sleep disordered breathing and obstructive sleep apnea the report of her daughter which is supporting; she declined sleep testing  In this context, bradycardia associated with calcium/beta-blockers are thought to be associated with a 50% resolution with the discontinuation of those drugs and in the resolved cohort, 50% of them will have recurrent issues over the next year.  She will be using her Apple Watch for monitoring she has bradycardia she is advised to give [place] a call.       Virl Axe

## 2021-09-08 NOTE — Patient Instructions (Signed)
Medication Instructions:  Your physician recommends that you continue on your current medications as directed. Please refer to the Current Medication list given to you today.  *If you need a refill on your cardiac medications before your next appointment, please call your pharmacy*   Lab Work: None ordered.  If you have labs (blood work) drawn today and your tests are completely normal, you will receive your results only by: MyChart Message (if you have MyChart) OR A paper copy in the mail If you have any lab test that is abnormal or we need to change your treatment, we will call you to review the results.   Testing/Procedures: None ordered.    Follow-Up: At [facility], you and your health needs are our priority.  As part of our continuing mission to provide you with exceptional heart care, we have created designated Provider Care Teams.  These Care Teams include your primary Cardiologist (physician) and Advanced Practice Providers (APPs -  Physician Assistants and Nurse Practitioners) who all work together to provide you with the care you need, when you need it.  We recommend signing up for the patient portal called "MyChart".  Sign up information is provided on this After Visit Summary.  MyChart is used to connect with patients for Virtual Visits (Telemedicine).  Patients are able to view lab/test results, encounter notes, upcoming appointments, etc.  Non-urgent messages can be sent to your provider as well.   To learn more about what you can do with MyChart, go to [email or web address].    Your next appointment:   Follow up with Dr Klein as needed  Important Information About Sugar       

## 2021-09-16 ENCOUNTER — Institutional Professional Consult (permissible substitution): Payer: Medicare HMO | Admitting: Internal Medicine

## 2021-09-16 ENCOUNTER — Ambulatory Visit (INDEPENDENT_AMBULATORY_CARE_PROVIDER_SITE_OTHER): Payer: Medicare HMO | Admitting: Pharmacist

## 2021-09-16 ENCOUNTER — Telehealth: Payer: Self-pay | Admitting: Cardiology

## 2021-09-16 DIAGNOSIS — E785 Hyperlipidemia, unspecified: Secondary | ICD-10-CM | POA: Diagnosis not present

## 2021-09-16 MED ORDER — ROSUVASTATIN CALCIUM 5 MG PO TABS: 5.0000 mg | ORAL_TABLET | Freq: Every day | ORAL | 11 refills | Status: DC

## 2021-09-16 NOTE — Telephone Encounter (Signed)
Pt c/o medication issue:  1. Name of Medication:  amLODipine (NORVASC) 2.5 MG tablet  2. How are you currently taking this medication (dosage and times per day)?  Take 1 tablet (2.5 mg total) by mouth daily.  3. Are you having a reaction (difficulty breathing--STAT)? no  4. What is your medication issue? It's causing leg pain, she has been having the pain for a week.

## 2021-09-16 NOTE — Telephone Encounter (Signed)
Pt's zetia has been stopped since her visit with pharmD.

## 2021-09-16 NOTE — Telephone Encounter (Signed)
Spoke with pt who is here in the office waiting for her appt with our pharmacist.  Advised to discuss possible side effects of Amlodipine with pharmacist during here appt.  Advised pt typically, leg pain is not a side effect of Amlodipine but it could be possible.  Leg swelling/is seen with this medication more frequently.  Pt states understanding and will discuss with pharmacist.

## 2021-09-16 NOTE — Progress Notes (Signed)
Patient ID: Vanessa Stewart                 DOB: Mar 19, 1945                    MRN: [id number]     HPI: Vanessa Stewart is a 76 y.o. female patient referred to lipid clinic by Dr Marlou Porch. PMH is significant for CAD, carotid artery stenosis, HTN, RBBB, Prinzmetal's angina, and HLD. Low risk Myoview in 2019. Coronary calcium score was 46 (51st percentile) on 07/2020. Sent for FFR analysis which suggested hemodynamically significant mid LAD stenosis. Follow up cath showed moderate 2 vessel CAD with 60-65% mid LAD stenosis and 60% focal ostial lesion in LCx just after 1st Mrg with plan for aggressive medical management.  Rosuvastatin increased from '10mg'$  to '20mg'$  in January 2023. Follow up LDL 76 on rosuvastatin '20mg'$  daily in April 2023. Ezetimibe was added at that time to target LDL goal < 70. Pt called clinic 07/21/21 reporting muscle pain felt to have worsened after increasing her rosuvastatin from '10mg'$  to '20mg'$ . She was advised to stop rosuvastatin for 3 days then resume at lower '10mg'$  dose. She did not report any improvement in symptoms during statin holiday or lower dose.  Presents today for follow up. Still taking ezetimibe and noticing some aches in her thighs. Took lower dose of rosuvastatin '5mg'$  daily for a year, doesn't recall issues on this dose.  Current Medications: ezetimibe '10mg'$  daily Intolerances: atorvastatin '20mg'$  daily (myalgias), pravastatin '80mg'$  daily, rosuvastatin '10mg'$  and '20mg'$  daily (myalgias), simvastatin '10mg'$ , '20mg'$ , and '40mg'$  daily (tolerated ok) Risk Factors: elevated calcium score and CAD LDL goal: '70mg'$ /dL  Diet: Avoids fried food and red meat  Exercise: Water exercises  Family History: Heart disease in her brother, brother, mother, and sister; Stroke in her father.  Social History: Denies tobacco, alcohol and drug use.  Labs: 08/20/21: TC 205, TG 91, HDL 56, LDL 133 (ezetimibe '10mg'$  daily) 05/14/21: TC 154, TG 120, HDL 57, LDL 76 (rosuvastatin '20mg'$  daily)  Past Medical  History:  Diagnosis Date   Allergy    Carotid artery stenosis 07/03/2015   1-39% bilateral by  dopplers 07/2015   Depression    Essential tremor    Hyperlipidemia    Hypertension    Hypothyroidism    Osteopenia    Prinzmetal's angina (HCC)    normal coronary arteries with coronary artery vasospasm at time of cath   RBBB    Chronic    Current Outpatient Medications on File Prior to Visit  Medication Sig Dispense Refill   ALPRAZolam (XANAX) 0.5 MG tablet Take 0.5 mg by mouth 2 (two) times daily as needed. (anxiety)     amLODipine (NORVASC) 2.5 MG tablet Take 1 tablet (2.5 mg total) by mouth daily. 30 tablet 11   aspirin 81 MG tablet Take 81 mg by mouth daily.     Calcium Carbonate-Vitamin D (CALCIUM-VITAMIN D3 PO) Take 1 tablet by mouth daily.     cetirizine (ZYRTEC) 10 MG tablet Take 10 mg by mouth daily.     ezetimibe (ZETIA) 10 MG tablet Take 1 tablet (10 mg total) by mouth daily. 90 tablet 3   levothyroxine (SYNTHROID, LEVOTHROID) 50 MCG tablet Take 50 mcg by mouth daily before breakfast.     lisinopril-hydrochlorothiazide (PRINZIDE,ZESTORETIC) 20-25 MG per tablet Take 1 tablet by mouth daily.     Omega-3 Fatty Acids (FISH OIL PO) Take 1 capsule by mouth daily.     PARoxetine (PAXIL) 20  MG tablet Take 20 mg by mouth daily.     potassium chloride SA (K-DUR,KLOR-CON) 20 MEQ tablet Take 1 tablet (20 mEq total) by mouth 2 (two) times daily. Please make overdue appt with Dr. Radford Pax before anymore refills. 1st attempt (Patient taking differently: Take 20 mEq by mouth 2 (two) times daily.) 60 tablet 0   risedronate (ACTONEL) 35 MG tablet Take 35 mg by mouth every Friday.     No current facility-administered medications on file prior to visit.    Allergies  Allergen Reactions   Elemental Sulfur Other (See Comments)    GI Issues   Fosamax [Alendronate] Other (See Comments)    Jaw pain    Lipitor [Atorvastatin] Other (See Comments)    Myalgias    Pravachol [Pravastatin] Other (See  Comments)    Myalgias    Septra [Sulfamethoxazole-Trimethoprim] Other (See Comments)    GI issues   Sulfa Antibiotics Nausea And Vomiting    GI issues   Zetia [Ezetimibe] Other (See Comments)    Myalgias   Crestor [Rosuvastatin] Swelling and Other (See Comments)    Myalgias     Assessment/Plan:  1. Hyperlipidemia - LDL has increased from 76 to 133 after stopping rosuvastatin '20mg'$  daily and starting ezetimibe '10mg'$  daily. Worse myalgias experienced when she took the combination of these two drugs. Still experiencing some muscle aches in her thighs on ezetimibe monotherapy. Will stop ezetimibe and advised pt to monitor for symptom improvement in the next week. Once she's feeling better, will start lower dose of rosuvastatin '5mg'$  daily which she seems to have tolerated well in the past. She'll call clinic with any side effects, otherwise will plan to reach out to pt in a month to follow up with tolerability. If she's tolerating rosuvastatin '5mg'$  daily, would retry adding on lower dose of ezetimibe '5mg'$  daily to see how she tolerates this. If she has trouble tolerating rosuvastatin, would try Praluent next. Pt agreeable to PCSK9i therapy and is ok with copay, her plan does not cover Repatha or Nexletol/Nexlizet. Will need lipids rechecked 2-3 months after she's on stable lipid lowering therapy.  Marthena Whitmyer E. Roseland Braun, PharmD, BCACP, [place] [address], [place], [place] [address] Phone: [phone]; Fax: [phone] 09/16/2021 2:21 PM

## 2021-09-16 NOTE — Patient Instructions (Addendum)
Your LDL cholesterol goal is < 70  Stop taking ezetimibe (Zetia). Monitor for improvement in your muscle pain  In 1 week if you're feeling better, start a lower dose of rosuvastatin (Crestor) '5mg'$  once daily  Call clinic with any side effects [phone]. We can try adding on a lower dose of Zetia '5mg'$  daily if you do well on the low dose of Crestor  A back up option we talked about is the Praluent injections which are given twice a month, lower your LDL cholesterol by 60%, and are about $45/month with your insurance

## 2021-09-17 DIAGNOSIS — H43393 Other vitreous opacities, bilateral: Secondary | ICD-10-CM | POA: Diagnosis not present

## 2021-09-17 DIAGNOSIS — H04123 Dry eye syndrome of bilateral lacrimal glands: Secondary | ICD-10-CM | POA: Diagnosis not present

## 2021-09-17 DIAGNOSIS — H353113 Nonexudative age-related macular degeneration, right eye, advanced atrophic without subfoveal involvement: Secondary | ICD-10-CM | POA: Diagnosis not present

## 2021-09-22 DIAGNOSIS — M25561 Pain in right knee: Secondary | ICD-10-CM | POA: Diagnosis not present

## 2021-09-25 DIAGNOSIS — M25561 Pain in right knee: Secondary | ICD-10-CM | POA: Diagnosis not present

## 2021-09-26 ENCOUNTER — Telehealth: Payer: Self-pay | Admitting: Cardiology

## 2021-09-26 NOTE — Telephone Encounter (Signed)
Pt reports having had a right knee injury. She was seen by her PCP who prescribed tramadol 50 mg BID prn.  Per Dr Marlou Porch - ok for her to take for pain.  Pt also reporting she was given a cortisone shot in her knee by Dr Veverly Fells.   All questions answered at the time of the call. She will call back if further concerns.

## 2021-09-26 NOTE — Telephone Encounter (Signed)
Pt would like a callback regarding medication the was recommended that she take for knee pain and inflammation by PCP.  Please advise

## 2021-10-03 DIAGNOSIS — M25561 Pain in right knee: Secondary | ICD-10-CM | POA: Diagnosis not present

## 2021-10-09 ENCOUNTER — Ambulatory Visit: Payer: Medicare HMO | Admitting: Cardiology

## 2021-10-10 ENCOUNTER — Encounter: Payer: Self-pay | Admitting: Nurse Practitioner

## 2021-10-10 ENCOUNTER — Ambulatory Visit: Payer: Medicare HMO | Attending: Cardiology | Admitting: Nurse Practitioner

## 2021-10-10 VITALS — BP 130/84 | HR 84 | Ht 62.0 in | Wt 141.6 lb

## 2021-10-10 DIAGNOSIS — I441 Atrioventricular block, second degree: Secondary | ICD-10-CM

## 2021-10-10 DIAGNOSIS — I2510 Atherosclerotic heart disease of native coronary artery without angina pectoris: Secondary | ICD-10-CM

## 2021-10-10 DIAGNOSIS — I201 Angina pectoris with documented spasm: Secondary | ICD-10-CM

## 2021-10-10 DIAGNOSIS — I6522 Occlusion and stenosis of left carotid artery: Secondary | ICD-10-CM | POA: Diagnosis not present

## 2021-10-10 DIAGNOSIS — I10 Essential (primary) hypertension: Secondary | ICD-10-CM | POA: Diagnosis not present

## 2021-10-10 DIAGNOSIS — R001 Bradycardia, unspecified: Secondary | ICD-10-CM | POA: Diagnosis not present

## 2021-10-10 DIAGNOSIS — I340 Nonrheumatic mitral (valve) insufficiency: Secondary | ICD-10-CM

## 2021-10-10 DIAGNOSIS — E785 Hyperlipidemia, unspecified: Secondary | ICD-10-CM

## 2021-10-10 MED ORDER — EZETIMIBE 10 MG PO TABS: 5.0000 mg | ORAL_TABLET | Freq: Every day | ORAL | 3 refills | Status: DC

## 2021-10-10 NOTE — Patient Instructions (Signed)
Medication Instructions:   RESTART Zetia one half tablet by mouth (0.5 mg) daily.  *If you need a refill on your cardiac medications before your next appointment, please call your pharmacy*   Lab Work:  Your physician recommends that you return for a FASTING lipid profile/ALT on Thursday, November 16. You can come in on the day of your appointment anytime between 7:30-4:30 fasting from midnight the night before.    If you have labs (blood work) drawn today and your tests are completely normal, you will receive your results only by: [place] (if you have MyChart) OR A paper copy in the mail If you have any lab test that is abnormal or we need to change your treatment, we will call you to review the results.   Testing/Procedures:  None ordered.   Follow-Up: At [facility], you and your health needs are our priority.  As part of our continuing mission to provide you with exceptional heart care, we have created designated Provider Care Teams.  These Care Teams include your primary Cardiologist (physician) and Advanced Practice Providers (APPs -  Physician Assistants and Nurse Practitioners) who all work together to provide you with the care you need, when you need it.  We recommend signing up for the patient portal called "MyChart".  Sign up information is provided on this After Visit Summary.  MyChart is used to connect with patients for Virtual Visits (Telemedicine).  Patients are able to view lab/test results, encounter notes, upcoming appointments, etc.  Non-urgent messages can be sent to your provider as well.   To learn more about what you can do with MyChart, go to [email or web address].    Your next appointment:   3 month(s)  The format for your next appointment:   In Person  Provider:   Candee Furbish, MD     Other Instructions  Mediterranean Diet A Mediterranean diet refers to food and lifestyle choices that are based on the traditions of countries  located on the [place]. It focuses on eating more fruits, vegetables, whole grains, beans, nuts, seeds, and heart-healthy fats, and eating less dairy, meat, eggs, and processed foods with added sugar, salt, and fat. This way of eating has been shown to help prevent certain conditions and improve outcomes for people who have chronic diseases, like kidney disease and heart disease. What are tips for following this plan? Reading food labels Check the serving size of packaged foods. For foods such as rice and pasta, the serving size refers to the amount of cooked product, not dry. Check the total fat in packaged foods. Avoid foods that have saturated fat or trans fats. Check the ingredient list for added sugars, such as corn syrup. Shopping  Buy a variety of foods that offer a balanced diet, including: Fresh fruits and vegetables (produce). Grains, beans, nuts, and seeds. Some of these may be available in unpackaged forms or large amounts (in bulk). Fresh seafood. Poultry and eggs. Low-fat dairy products. Buy whole ingredients instead of prepackaged foods. Buy fresh fruits and vegetables in-season from local farmers markets. Buy plain frozen fruits and vegetables. If you do not have access to quality fresh seafood, buy precooked frozen shrimp or canned fish, such as tuna, salmon, or sardines. Stock your pantry so you always have certain foods on hand, such as olive oil, canned tuna, canned tomatoes, rice, pasta, and beans. Cooking Cook foods with extra-virgin olive oil instead of using butter or other vegetable oils. Have meat as a side dish, and  have vegetables or grains as your main dish. This means having meat in small portions or adding small amounts of meat to foods like pasta or stew. Use beans or vegetables instead of meat in common dishes like chili or lasagna. Experiment with different cooking methods. Try roasting, broiling, steaming, and sauting vegetables. Add frozen  vegetables to soups, stews, pasta, or rice. Add nuts or seeds for added healthy fats and plant protein at each meal. You can add these to yogurt, salads, or vegetable dishes. Marinate fish or vegetables using olive oil, lemon juice, garlic, and fresh herbs. Meal planning Plan to eat one vegetarian meal one day each week. Try to work up to two vegetarian meals, if possible. Eat seafood two or more times a week. Have healthy snacks readily available, such as: Vegetable sticks with hummus. Greek yogurt. Fruit and nut trail mix. Eat balanced meals throughout the week. This includes: Fruit: 2-3 servings a day. Vegetables: 4-5 servings a day. Low-fat dairy: 2 servings a day. Fish, poultry, or lean meat: 1 serving a day. Beans and legumes: 2 or more servings a week. Nuts and seeds: 1-2 servings a day. Whole grains: 6-8 servings a day. Extra-virgin olive oil: 3-4 servings a day. Limit red meat and sweets to only a few servings a month. Lifestyle  Cook and eat meals together with your family, when possible. Drink enough fluid to keep your urine pale yellow. Be physically active every day. This includes: Aerobic exercise like running or swimming. Leisure activities like gardening, walking, or housework. Get 7-8 hours of sleep each night. If recommended by your health care provider, drink red wine in moderation. This means 1 glass a day for nonpregnant women and 2 glasses a day for men. A glass of wine equals 5 oz (150 mL). What foods should I eat? Fruits Apples. Apricots. Avocado. Berries. Bananas. Cherries. Dates. Figs. Grapes. Lemons. Melon. Oranges. Peaches. Plums. Pomegranate. Vegetables Artichokes. Beets. Broccoli. Cabbage. Carrots. Eggplant. Green beans. Chard. Kale. Spinach. Onions. Leeks. Peas. Squash. Tomatoes. Peppers. Radishes. Grains Whole-grain pasta. Brown rice. Bulgur wheat. Polenta. Couscous. Whole-wheat bread. [place]. Meats and other proteins Beans. Almonds.  Sunflower seeds. Pine nuts. Peanuts. [place]. Salmon. Scallops. Shrimp. [place]. Tilapia. Clams. Oysters. Eggs. Poultry without skin. Dairy Low-fat milk. Cheese. Greek yogurt. Fats and oils Extra-virgin olive oil. Avocado oil. Grapeseed oil. Beverages Water. Red wine. Herbal tea. Sweets and desserts Greek yogurt with honey. Baked apples. Poached pears. Trail mix. Seasonings and condiments Basil. Cilantro. Coriander. Cumin. Mint. Parsley. Sage. Rosemary. Tarragon. Garlic. Oregano. Thyme. Pepper. Balsamic vinegar. Tahini. Hummus. Tomato sauce. Olives. Mushrooms. The items listed above may not be a complete list of foods and beverages you can eat. Contact a dietitian for more information. What foods should I limit? This is a list of foods that should be eaten rarely or only on special occasions. Fruits Fruit canned in syrup. Vegetables Deep-fried potatoes (french fries). Grains Prepackaged pasta or rice dishes. Prepackaged cereal with added sugar. Prepackaged snacks with added sugar. Meats and other proteins Beef. Pork. Lamb. Poultry with skin. Hot dogs. [place] Salines. Dairy Ice cream. Sour cream. Whole milk. Fats and oils Butter. Canola oil. Vegetable oil. Beef fat (tallow). Lard. Beverages Juice. Sugar-sweetened soft drinks. Beer. Liquor and spirits. Sweets and desserts Cookies. Cakes. Pies. Candy. Seasonings and condiments Mayonnaise. Pre-made sauces and marinades. The items listed above may not be a complete list of foods and beverages you should limit. Contact a dietitian for more information. Summary The Mediterranean diet includes both food and  lifestyle choices. Eat a variety of fresh fruits and vegetables, beans, nuts, seeds, and whole grains. Limit the amount of red meat and sweets that you eat. If recommended by your health care provider, drink red wine in moderation. This means 1 glass a day for nonpregnant women and 2 glasses a day for men. A glass of wine equals 5 oz (150 mL). This  information is not intended to replace advice given to you by your health care provider. Make sure you discuss any questions you have with your health care provider. Document Revised: 03/03/2019 Document Reviewed: 12/29/2018 Elsevier Patient Education  [place]

## 2021-10-10 NOTE — Progress Notes (Signed)
Cardiology Office Note:    Date:  10/10/2021   ID:  Vanessa Stewart, DOB 19-Apr-1945, MRN [id number]  PCP:  Mayra Neer, MD   [facility] Providers Cardiologist:  Candee Furbish, MD     Referring MD: Mayra Neer, MD   Chief Complaint: Follow-up hypertension, hyperlipidemia  History of Present Illness:    Vanessa Stewart is a very pleasant 76 y.o. female with a hx of CAD, carotid artery stenosis, hypertension, RBBB, Prinzmetal's angina, and hyperlipidemia.   Established care with cardiology prior to 2015 and requested a provider switch to Dr. Marlou Porch 03/2020. She had a low risk Myoview in 2019.   She had cardiac catheterization after abnormal coronary CT scan. She was noted to have circumflex and LAD moderate nonflow limiting disease on cath assessment FFR, medical management.  She has a moderate carotid artery stenosis on the left, mild on the right  She was last seen in our office on 01/20/2021 by Dr. Marlou Porch at which time her Crestor was increased to 10 mg due to LDL above goal of 70. She called back to report she was already taking 10 mg and dose was increased to 20 mg.  LDL on 05/14/2021 improved to 76 but remained above goal, Zetia 10 mg was added.   Seen by me on 07/10/21 for follow-up. Had no specific cardiac concerns. Was not currently exercising on a consistent basis, 2/2 left foot pain for which she is seeing orthopedics.  Planning to return to water aerobics soon. Admits to eating out on a consistent basis, but tries to monitor sodium. She  denied chest pain, shortness of breath, lower extremity edema, fatigue, palpitations, melena, hematuria, hemoptysis, diaphoresis, weakness, presyncope, syncope, orthopnea, and PND. Occasional bilateral hand swelling that she is concerned is associated with starting Zetia.  She called our office on 07/21/2021 to report pain in her hands that was attributed to rosuvastatin.  Referred to lipid clinic and seen by Fuller Canada, [place] on 09/16/2021 at  which time she was taking ezetimibe 10 mg.  She reported she still had some muscle aches in her thighs.  She was advised to monitor for symptom improvement and once feeling better start rosuvastatin 5 mg daily.  If she tolerated rosuvastatin at lower dose, would reduce ezetimibe to 5 mg as well. Plan for 2-3 month follow-up.   Seen by Finis Bud, NP on 08/20/21 following hospitalization 08/10/2021 for Mobitz type I AV heart block and hypertension.  She noted HR in the 40s on her Apple Watch the evening prior and took herself to the ED on 08/10/2021.  She had some mild neck pain but otherwise no significant symptoms.  Was found to be in second-degree block type I, was asymptomatic with this.  Her potassium was 3.0 and she received potassium replacement in the hospital, otherwise labs were stable.  Her diltiazem was stopped and arrangements were made for 3-day monitor.  She was started on amlodipine 2.5 mg daily for high blood pressure.  EKG revealed NSR at 67 bpm with a right bundle branch block.  She continued to note drops in HR to 30s, asymptomatic.  Cardiac monitor revealed high degree AV block.  She was referred to EP for pacemaker evaluation.  Seen by Dr. Caryl Comes on 09/08/2021.  Monitor showed consecutively dropped P waves but all the bradycardia was at night.  He noted her to have sleep disordered breathing and daytime somnolence.  Today, she reports no decrease in HR since seeing Dr. Caryl Comes on 7/31.  I  reviewed her data from her Apple Watch which revealed no HR < 50 bpm since 09/05/21. She is feeling well and has had no symptoms with bradycardia since the initial diagnosis 7/2. Is concerned that she may need knee surgery in the future, wants to know if she will be able to get clearance.  I advised her that if that is the case, we will evaluate the request from the surgeon at that time and provided appropriate response. She denies chest pain, shortness of breath, lower extremity edema, fatigue, palpitations,  melena, hematuria, hemoptysis, diaphoresis, weakness, presyncope, syncope, orthopnea, and PND. Is working on eating better so that LDL will be better. Is tolerating rosuvastatin 5 mg daily.   Past Medical History:  Diagnosis Date   Allergy    Carotid artery stenosis 07/03/2015   1-39% bilateral by  dopplers 07/2015   Depression    Essential tremor    Hyperlipidemia    Hypertension    Hypothyroidism    Osteopenia    Prinzmetal's angina (HCC)    normal coronary arteries with coronary artery vasospasm at time of cath   RBBB    Chronic    Past Surgical History:  Procedure Laterality Date   CARDIAC CATHETERIZATION  12/09   coronary spasm noted on heart cath. No coronary artery disease. LV function normal   colonoscopy  07/07   ganem- 10 Years   INTRAVASCULAR PRESSURE WIRE/FFR STUDY N/A 07/26/2020   Procedure: INTRAVASCULAR PRESSURE WIRE/FFR STUDY;  Surgeon: Leonie Man, MD;  Location: [facility];  Service: Cardiovascular;  Laterality: N/A;   KNEE SURGERY     LEFT HEART CATH AND CORONARY ANGIOGRAPHY N/A 07/26/2020   Procedure: LEFT HEART CATH AND CORONARY ANGIOGRAPHY;  Surgeon: Leonie Man, MD;  Location: [facility];  Service: Cardiovascular;  Laterality: N/A;   THYROIDECTOMY     TOTAL ABDOMINAL HYSTERECTOMY W/ BILATERAL SALPINGOOPHORECTOMY      Current Medications: Current Meds  Medication Sig   ALPRAZolam (XANAX) 0.5 MG tablet Take 0.5 mg by mouth 2 (two) times daily as needed. (anxiety)   amLODipine (NORVASC) 2.5 MG tablet Take 1 tablet (2.5 mg total) by mouth daily.   aspirin 81 MG tablet Take 81 mg by mouth daily.   Calcium Carbonate-Vitamin D (CALCIUM-VITAMIN D3 PO) Take 1 tablet by mouth daily.   cetirizine (ZYRTEC) 10 MG tablet Take 10 mg by mouth daily.   ezetimibe (ZETIA) 10 MG tablet Take 0.5 tablets (5 mg total) by mouth daily.   levothyroxine (SYNTHROID, LEVOTHROID) 50 MCG tablet Take 50 mcg by mouth daily before breakfast.    lisinopril-hydrochlorothiazide (PRINZIDE,ZESTORETIC) 20-25 MG per tablet Take 1 tablet by mouth daily.   Omega-3 Fatty Acids (FISH OIL PO) Take 1 capsule by mouth daily.   PARoxetine (PAXIL) 20 MG tablet Take 20 mg by mouth daily.   potassium chloride SA (K-DUR,KLOR-CON) 20 MEQ tablet Take 1 tablet (20 mEq total) by mouth 2 (two) times daily. Please make overdue appt with Dr. Radford Pax before anymore refills. 1st attempt   risedronate (ACTONEL) 35 MG tablet Take 35 mg by mouth every Friday.   rosuvastatin (CRESTOR) 5 MG tablet Take 1 tablet (5 mg total) by mouth daily.     Allergies:   Elemental sulfur, Fosamax [alendronate], Lipitor [atorvastatin], Pravachol [pravastatin], Septra [sulfamethoxazole-trimethoprim], Sulfa antibiotics, Zetia [ezetimibe], and Crestor [rosuvastatin]   Social History   Socioeconomic History   Marital status: Widowed    Spouse name: Not on file   Number of children: Not  on file   Years of education: Not on file   Highest education level: Not on file  Occupational History   Not on file  Tobacco Use   Smoking status: Never   Smokeless tobacco: Never  Vaping Use   Vaping Use: Never used  Substance and Sexual Activity   Alcohol use: No    Alcohol/week: 0.0 standard drinks of alcohol   Drug use: No   Sexual activity: Not on file  Other Topics Concern   Not on file  Social History Narrative   Not on file   Social Determinants of Health   Financial Resource Strain: Not on file  Food Insecurity: Not on file  Transportation Needs: Not on file  Physical Activity: Not on file  Stress: Not on file  Social Connections: Not on file     Family History: The patient's family history includes Heart disease in her brother, brother, mother, and sister; Stroke in her father.  ROS:   Please see the history of present illness.   All other systems reviewed and are negative.  Labs/Other Studies Reviewed:    The following studies were reviewed today:  VAS US  Carotid 04/28/21  Right Carotid: Velocities in the right ICA are consistent with a 1-39%  stenosis.   Left Carotid: Velocities in the left distal ICA are consistent with a  40-59%               stenosis (area of tortuosity).   Vertebrals:  Bilateral vertebral arteries demonstrate antegrade flow.  Subclavians: Normal flow hemodynamics were seen in bilateral subclavian               arteries.   LHC 07/26/20  Mid LAD-1 lesion is 30% stenosed. Mid LAD-2 lesion is 60% stenosed. RFR 0.91 -> FFR 0.87 (Not significant) Mid LAD to Dist LAD lesion is 30% stenosed. Mid Cx lesion is 60% stenosed. RFR 0.93 (Not significant) LV end diastolic pressure is normal.   SUMMARY Moderate Two-Vessel Disease: Both non-Physiologically significant lesions. Mid LAD discrete eccentric, 60-65% stenosis just prior to branching into equal size major 3rd Diag and distal LAD  CT FFR positive, however RFR 0.91, FFR 0.87 -> not physiologically significant Focal "ostial " 60% lesion in the LCx just after 1st Mrg --  RFR negative, 0-.93 Normal RCA Normal LVEDP     RECOMMENDATIONS Aggressive Guideline Directed Medical Management for moderate CAD   Coronary CTA 07/18/20  1. Coronary artery calicum score 46 Agatston units. This places the patient in the 51st percentile for age and gender, suggesting intermediate risk for future cardiac events.   2. Discrete calcified mid LAD stenosis, hemodynamically significant by FFR (0.57).   The LAD stenosis could be artifactual and Heartflow may be modeling artifact (Flash scan protocol was used). If patient does not have suggestive symptoms of angina, would consider Cardiolite versus coronary CTA using the regular protocol to confirm.   1. Left Main: No significant stenosis.   2. LAD: FFR 0.57 mid LAD. 3. LCX: FFR 0.56 far distal LCx. There is significant artifact on the CTA images in the distal LCx so not sure of significance of this finding. 4. RCA: No  significant stenosis.   IMPRESSION: 1. CT FFR analysis suggests hemodynamically significant mid LAD stenosis. However, Flash protocol used with artifact noted in the mid LAD. It is possible that the CT FFR is modeling artifact. If the patient has anginal symptoms, would consider proceeding with cath. If symptoms are not suggestive of angina, consider  doing either a Cardiolite or repeat coronary CT by the usual protocol.   Echo 02/01/19   1. Left ventricular ejection fraction, by visual estimation, is 60 to  65%. The left ventricle has normal function. There is mildly increased  left ventricular hypertrophy.   2. Left ventricular diastolic parameters are indeterminate.   3. The left ventricle has no regional wall motion abnormalities.   4. Global right ventricle has normal systolic function.The right  ventricular size is normal. No increase in right ventricular wall  thickness.   5. Left atrial size was normal.   6. Right atrial size was normal.   7. The mitral valve is normal in structure. Trivial mitral valve  regurgitation. No evidence of mitral stenosis.   8. No significant mitral valve prolapse appreciated.   9. The tricuspid valve is normal in structure.  10. The aortic valve is tricuspid. Aortic valve regurgitation is not  visualized. Mild aortic valve sclerosis without stenosis.  11. The pulmonic valve was normal in structure. Pulmonic valve  regurgitation is trivial.  12. Mildly elevated pulmonary artery systolic pressure.  13. The inferior vena cava is normal in size with greater than 50%  respiratory variability, suggesting right atrial pressure of 3 mmHg.    Exercise Myoview 02/24/17  Nuclear stress EF: 64%. The study is normal. This is a low risk study. The left ventricular ejection fraction is normal (55-65%). Blood pressure demonstrated a normal response to exercise. There was no ST segment deviation noted during stress from baseline EKG changes due to RBBB.    Finalized by Sueanne Margarita, MD on Wed Feb 24, 2017  3:37 PM Nuclear stress EF: 64%. The study is normal. This is a low risk study. The left ventricular ejection fraction is normal (55-65%).  Recent Labs: 08/11/2021: Hemoglobin 14.2; Platelets 231 08/20/2021: ALT 11; BUN 13; Creatinine, Ser 0.50; Magnesium 2.1; Potassium 3.9; Sodium 142; TSH 1.600  Recent Lipid Panel    Component Value Date/Time   CHOL 205 (H) 08/20/2021 0936   TRIG 91 08/20/2021 0936   HDL 56 08/20/2021 0936   CHOLHDL 3.7 08/20/2021 0936   CHOLHDL 2.2 11/25/2015 1053   VLDL 19 11/25/2015 1053   LDLCALC 133 (H) 08/20/2021 0936   LDLDIRECT 114.0 07/03/2014 1046     Risk Assessment/Calculations:         Physical Exam:    VS:  BP 130/84   Pulse 84   Ht '5\' 2"'$  (1.575 m)   Wt 141 lb 9.6 oz (64.2 kg)   SpO2 97%   BMI 25.90 kg/m     Wt Readings from Last 3 Encounters:  10/10/21 141 lb 9.6 oz (64.2 kg)  09/08/21 145 lb (65.8 kg)  08/20/21 145 lb (65.8 kg)     GEN:  Well nourished, well developed in no acute distress HEENT: Normal NECK: No JVD; No carotid bruits CARDIAC: RRR, 2/6 murmur RUSB. No rubs, gallops RESPIRATORY:  Clear to auscultation without rales, wheezing or rhonchi  ABDOMEN: Soft, non-tender, non-distended MUSCULOSKELETAL:  No edema; No deformity. 2+ pedal pulses, equal bilaterally SKIN: Warm and dry NEUROLOGIC:  Alert and oriented x 3 PSYCHIATRIC:  Normal affect   EKG:  EKG is not ordered today.   Diagnoses:    1. Bradycardia   2. Hyperlipidemia LDL goal <70   3. Coronary artery disease involving native coronary artery of native heart without angina pectoris   4. Mobitz type 1 second degree atrioventricular block   5. Prinzmetal angina ([place])   6.  Nonrheumatic mitral valve regurgitation   7. Essential hypertension   8. Stenosis of left carotid artery     Assessment and Plan:     Bradycardia/2nd degree heart block: Admission 08/2021 for bradycardia, second-degree heart block  has been called Mobitz type I and type II.  Diltiazem was stopped.  Seen by Dr. Caryl Comes, [place], 7/31.  No further episodes of bradycardia since that time.  Mild to moderate MR: Mild to moderate mitral valve regurgitation, no evidence of stenosis on echo 08/06/2021. She is asymptomatic.  No significant murmur on exam. Would recommend repeat echo in 12-18 months.   CAD without angina: Moderate two-vessel disease both non-physiologically significant lesions, recommendation for aggressive medical therapy. She denies chest pain, dyspnea, or other symptoms concerning for angina.  No indication for further ischemic evaluation at this time. Continue statin, amlodipine, asa, lisinopril.   Prinzmetal angina: She denies chest pain, dyspnea, or other symptoms concerning for angina.    Hyperlipidemia LDL goal < 70: LDL 133 on 08/20/21. Seen by Pharm D on 8/8 with plan to start rosuvastatin 5 mg daily. She is tolerating well, did not tolerate higher dose.  Per Jinny Blossom Supple's note, we will add Zetia 5 mg to see if she tolerates.  We will plan for repeat lipids in 2 months. Encouraged mostly plant-based diet like Mediterranean.  Hypertension: BP is well controlled.  Elevated home BP readings are prior to taking medications. Continue lisinopril-hctz, amlodipine.   Carotid artery disease: Mild stenosis right ICA 1-39%, moderate stenosis left ICA 40-59% by u/s 04/2021. Seen by [facility] with plan to repeat carotid u/s in 1 year. No lightheadedness, presyncope, syncope.    Disposition: 3 months with Dr. Marlou Porch   Medication Adjustments/Labs and Tests Ordered: Current medicines are reviewed at length with the patient today.  Concerns regarding medicines are outlined above.  Orders Placed This Encounter  Procedures   Lipid Profile   ALT   Meds ordered this encounter  Medications   ezetimibe (ZETIA) 10 MG tablet    Sig: Take 0.5 tablets (5 mg total) by mouth daily.    Dispense:  45 tablet    Refill:  3    Patient  Instructions  Medication Instructions:   RESTART Zetia one half tablet by mouth (0.5 mg) daily.  *If you need a refill on your cardiac medications before your next appointment, please call your pharmacy*   Lab Work:  Your physician recommends that you return for a FASTING lipid profile/ALT on Thursday, November 16. You can come in on the day of your appointment anytime between 7:30-4:30 fasting from midnight the night before.    If you have labs (blood work) drawn today and your tests are completely normal, you will receive your results only by: [place] (if you have MyChart) OR A paper copy in the mail If you have any lab test that is abnormal or we need to change your treatment, we will call you to review the results.   Testing/Procedures:  None ordered.   Follow-Up: At [facility], you and your health needs are our priority.  As part of our continuing mission to provide you with exceptional heart care, we have created designated Provider Care Teams.  These Care Teams include your primary Cardiologist (physician) and Advanced Practice Providers (APPs -  Physician Assistants and Nurse Practitioners) who all work together to provide you with the care you need, when you need it.  We recommend signing up for the patient portal called "MyChart".  Sign  up information is provided on this After Visit Summary.  MyChart is used to connect with patients for Virtual Visits (Telemedicine).  Patients are able to view lab/test results, encounter notes, upcoming appointments, etc.  Non-urgent messages can be sent to your provider as well.   To learn more about what you can do with MyChart, go to [email or web address].    Your next appointment:   3 month(s)  The format for your next appointment:   In Person  Provider:   Candee Furbish, MD     Other Instructions  Mediterranean Diet A Mediterranean diet refers to food and lifestyle choices that are based on the traditions of  countries located on the [place]. It focuses on eating more fruits, vegetables, whole grains, beans, nuts, seeds, and heart-healthy fats, and eating less dairy, meat, eggs, and processed foods with added sugar, salt, and fat. This way of eating has been shown to help prevent certain conditions and improve outcomes for people who have chronic diseases, like kidney disease and heart disease. What are tips for following this plan? Reading food labels Check the serving size of packaged foods. For foods such as rice and pasta, the serving size refers to the amount of cooked product, not dry. Check the total fat in packaged foods. Avoid foods that have saturated fat or trans fats. Check the ingredient list for added sugars, such as corn syrup. Shopping  Buy a variety of foods that offer a balanced diet, including: Fresh fruits and vegetables (produce). Grains, beans, nuts, and seeds. Some of these may be available in unpackaged forms or large amounts (in bulk). Fresh seafood. Poultry and eggs. Low-fat dairy products. Buy whole ingredients instead of prepackaged foods. Buy fresh fruits and vegetables in-season from local farmers markets. Buy plain frozen fruits and vegetables. If you do not have access to quality fresh seafood, buy precooked frozen shrimp or canned fish, such as tuna, salmon, or sardines. Stock your pantry so you always have certain foods on hand, such as olive oil, canned tuna, canned tomatoes, rice, pasta, and beans. Cooking Cook foods with extra-virgin olive oil instead of using butter or other vegetable oils. Have meat as a side dish, and have vegetables or grains as your main dish. This means having meat in small portions or adding small amounts of meat to foods like pasta or stew. Use beans or vegetables instead of meat in common dishes like chili or lasagna. Experiment with different cooking methods. Try roasting, broiling, steaming, and sauting vegetables. Add  frozen vegetables to soups, stews, pasta, or rice. Add nuts or seeds for added healthy fats and plant protein at each meal. You can add these to yogurt, salads, or vegetable dishes. Marinate fish or vegetables using olive oil, lemon juice, garlic, and fresh herbs. Meal planning Plan to eat one vegetarian meal one day each week. Try to work up to two vegetarian meals, if possible. Eat seafood two or more times a week. Have healthy snacks readily available, such as: Vegetable sticks with hummus. Greek yogurt. Fruit and nut trail mix. Eat balanced meals throughout the week. This includes: Fruit: 2-3 servings a day. Vegetables: 4-5 servings a day. Low-fat dairy: 2 servings a day. Fish, poultry, or lean meat: 1 serving a day. Beans and legumes: 2 or more servings a week. Nuts and seeds: 1-2 servings a day. Whole grains: 6-8 servings a day. Extra-virgin olive oil: 3-4 servings a day. Limit red meat and sweets to only a few servings a month. Lifestyle  Cook and eat meals together with your family, when possible. Drink enough fluid to keep your urine pale yellow. Be physically active every day. This includes: Aerobic exercise like running or swimming. Leisure activities like gardening, walking, or housework. Get 7-8 hours of sleep each night. If recommended by your health care provider, drink red wine in moderation. This means 1 glass a day for nonpregnant women and 2 glasses a day for men. A glass of wine equals 5 oz (150 mL). What foods should I eat? Fruits Apples. Apricots. Avocado. Berries. Bananas. Cherries. Dates. Figs. Grapes. Lemons. Melon. Oranges. Peaches. Plums. Pomegranate. Vegetables Artichokes. Beets. Broccoli. Cabbage. Carrots. Eggplant. Green beans. Chard. Kale. Spinach. Onions. Leeks. Peas. Squash. Tomatoes. Peppers. Radishes. Grains Whole-grain pasta. Brown rice. Bulgur wheat. Polenta. Couscous. Whole-wheat bread. [place]. Meats and other proteins Beans.  Almonds. Sunflower seeds. Pine nuts. Peanuts. [place]. Salmon. Scallops. Shrimp. [place]. Tilapia. Clams. Oysters. Eggs. Poultry without skin. Dairy Low-fat milk. Cheese. Greek yogurt. Fats and oils Extra-virgin olive oil. Avocado oil. Grapeseed oil. Beverages Water. Red wine. Herbal tea. Sweets and desserts Greek yogurt with honey. Baked apples. Poached pears. Trail mix. Seasonings and condiments Basil. Cilantro. Coriander. Cumin. Mint. Parsley. Sage. Rosemary. Tarragon. Garlic. Oregano. Thyme. Pepper. Balsamic vinegar. Tahini. Hummus. Tomato sauce. Olives. Mushrooms. The items listed above may not be a complete list of foods and beverages you can eat. Contact a dietitian for more information. What foods should I limit? This is a list of foods that should be eaten rarely or only on special occasions. Fruits Fruit canned in syrup. Vegetables Deep-fried potatoes (french fries). Grains Prepackaged pasta or rice dishes. Prepackaged cereal with added sugar. Prepackaged snacks with added sugar. Meats and other proteins Beef. Pork. Lamb. Poultry with skin. Hot dogs. Berniece Salines. Dairy Ice cream. Sour cream. Whole milk. Fats and oils Butter. Canola oil. Vegetable oil. Beef fat (tallow). Lard. Beverages Juice. Sugar-sweetened soft drinks. Beer. Liquor and spirits. Sweets and desserts Cookies. Cakes. Pies. Candy. Seasonings and condiments Mayonnaise. Pre-made sauces and marinades. The items listed above may not be a complete list of foods and beverages you should limit. Contact a dietitian for more information. Summary The Mediterranean diet includes both food and lifestyle choices. Eat a variety of fresh fruits and vegetables, beans, nuts, seeds, and whole grains. Limit the amount of red meat and sweets that you eat. If recommended by your health care provider, drink red wine in moderation. This means 1 glass a day for nonpregnant women and 2 glasses a day for men. A glass of wine equals 5 oz (150  mL). This information is not intended to replace advice given to you by your health care provider. Make sure you discuss any questions you have with your health care provider. Document Revised: 03/03/2019 Document Reviewed: 12/29/2018 Elsevier Patient Education  [place]         Signed, Emmaline Life, NP  10/10/2021 11:40 AM    [place]

## 2021-10-15 ENCOUNTER — Telehealth: Payer: Self-pay | Admitting: Cardiology

## 2021-10-15 NOTE — Telephone Encounter (Signed)
I s/w Dr. Sarajane Jews, [facility] office and obtained fax # [phone]. We will fax over clearance notes today.

## 2021-10-15 NOTE — Telephone Encounter (Signed)
   Patient Name: Vanessa Stewart  DOB: 03/20/1945 MRN: [id number]  Primary Cardiologist: Candee Furbish, MD  Chart reviewed as part of pre-operative protocol coverage.   IF SIMPLE EXTRACTION/CLEANINGS/FILLING: Simple dental extractions or fillings  (i.e. 1-2 teeth) are considered low risk procedures per guidelines and generally do not require any specific cardiac clearance. It is also generally accepted that for simple extractions and dental cleanings, there is no need to interrupt blood thinner therapy.   SBE prophylaxis is not required for the patient from a cardiac standpoint.  I will route this recommendation to the requesting party via Epic fax function and remove from pre-op pool.  Please call with questions.  Elgie Collard, PA-C 10/15/2021, 9:56 AM

## 2021-10-15 NOTE — Telephone Encounter (Signed)
   Pre-operative Risk Assessment    Patient Name: Vanessa Stewart  DOB: 1945/06/26 MRN: [id number]     Request for Surgical Clearance    Procedure:   Filling  Date of Surgery:  Clearance 10/20/21                                 Surgeon:  Dr. Dannette Barbara Surgeon's Group or Practice Name:  [facility] Phone number:  [phone] Fax number:     Type of Clearance Requested:   - Medical    Type of Anesthesia:  Not Indicated   Additional requests/questions:   Patient is concerned if she will need to take precautions prior to this dental work.  Signed, Heloise Beecham   10/15/2021, 9:37 AM

## 2021-10-20 ENCOUNTER — Telehealth: Payer: Self-pay | Admitting: Cardiology

## 2021-10-20 NOTE — Telephone Encounter (Signed)
Per pt was given script for Tramadol from orthopedic and does not tolerate Will forward to Dr Marlou Porch for review .Adonis Housekeeper

## 2021-10-20 NOTE — Telephone Encounter (Signed)
Pt called stating she has been having some pain in her knee. She states that she would like Dr. Marlou Porch to prescribe something for her since she has heart issues. She states her son who is a Software engineer suggested meloxicam.

## 2021-10-21 NOTE — Telephone Encounter (Signed)
Safest from cardiology perspective is Tylenol.  Candee Furbish, MD   Called patient with Dr. Marlou Porch, recommendation. Patient verbalized understanding

## 2021-10-21 NOTE — Telephone Encounter (Signed)
Opt calling to f/u on call from yesterday regarding medication that she is able to take. Please advise

## 2021-10-28 DIAGNOSIS — M1711 Unilateral primary osteoarthritis, right knee: Secondary | ICD-10-CM | POA: Diagnosis not present

## 2021-11-04 DIAGNOSIS — M1711 Unilateral primary osteoarthritis, right knee: Secondary | ICD-10-CM | POA: Diagnosis not present

## 2021-11-11 DIAGNOSIS — M1711 Unilateral primary osteoarthritis, right knee: Secondary | ICD-10-CM | POA: Diagnosis not present

## 2021-12-11 DIAGNOSIS — Z23 Encounter for immunization: Secondary | ICD-10-CM | POA: Diagnosis not present

## 2021-12-19 DIAGNOSIS — G44219 Episodic tension-type headache, not intractable: Secondary | ICD-10-CM | POA: Diagnosis not present

## 2021-12-19 DIAGNOSIS — H6123 Impacted cerumen, bilateral: Secondary | ICD-10-CM | POA: Diagnosis not present

## 2021-12-25 ENCOUNTER — Other Ambulatory Visit: Payer: PRIVATE HEALTH INSURANCE

## 2021-12-30 DIAGNOSIS — I2510 Atherosclerotic heart disease of native coronary artery without angina pectoris: Secondary | ICD-10-CM | POA: Diagnosis not present

## 2021-12-30 DIAGNOSIS — E782 Mixed hyperlipidemia: Secondary | ICD-10-CM | POA: Diagnosis not present

## 2021-12-30 DIAGNOSIS — E039 Hypothyroidism, unspecified: Secondary | ICD-10-CM | POA: Diagnosis not present

## 2021-12-30 DIAGNOSIS — R7303 Prediabetes: Secondary | ICD-10-CM | POA: Diagnosis not present

## 2021-12-30 DIAGNOSIS — I119 Hypertensive heart disease without heart failure: Secondary | ICD-10-CM | POA: Diagnosis not present

## 2021-12-30 DIAGNOSIS — I441 Atrioventricular block, second degree: Secondary | ICD-10-CM | POA: Diagnosis not present

## 2022-01-15 ENCOUNTER — Ambulatory Visit: Payer: Medicare HMO | Attending: Cardiology | Admitting: Cardiology

## 2022-01-15 ENCOUNTER — Encounter: Payer: Self-pay | Admitting: Cardiology

## 2022-01-15 VITALS — BP 130/80 | HR 80 | Ht 62.0 in | Wt 145.0 lb

## 2022-01-15 DIAGNOSIS — I4510 Unspecified right bundle-branch block: Secondary | ICD-10-CM | POA: Diagnosis not present

## 2022-01-15 DIAGNOSIS — I441 Atrioventricular block, second degree: Secondary | ICD-10-CM

## 2022-01-15 DIAGNOSIS — I2510 Atherosclerotic heart disease of native coronary artery without angina pectoris: Secondary | ICD-10-CM

## 2022-01-15 NOTE — Progress Notes (Signed)
Cardiology Office Note:    Date:  01/15/2022   ID:  Vanessa Stewart, DOB 1945/04/02, MRN [id number]  PCP:  Vanessa Neer, MD   [facility] Providers Cardiologist:  Vanessa Furbish, MD     Referring MD: Vanessa Neer, MD   History of Present Illness:    Vanessa Stewart is a 76 y.o. female is here for follow-up of coronary artery disease status post heart catheterization after abnormal coronary CT scan.  This resulted in medical management with noted circumflex and LAD moderate nonflow limiting disease on cath assessment FFR.  Has had difficulties with Crestor.  Has been seen by Vanessa Stewart RPH in lipid clinic.  She was also seen in July 2023 for Mobitz type I AV heart block.  Heart rate was in 40s on her Apple watch.  Asymptomatic secondary heart block type I.  She received potassium supplementation.  Diltiazem was stopped.  She had continued heart rates in the 30s.  High degree AV block on monitor.  Dr. Caryl Stewart reviewed monitor dropping P waves but all the bradycardia was at night.  Had knee pain in September 2023-recommended Tylenol and also had dental extractions at that time.  Overall she is doing very well.  No issues.  No syncope.  Her heart rate is much better after stopping the diltiazem.  She is also tolerating the low-dose Crestor and low-dose Zetia.  Previous Visit:  Office visit for valuation of chest pain at the request of Vanessa Stewart.  In review of her note from clinic on 03/15/2020 she has a history of Prinzmetal's angina but has not used nitroglycerin in the year.  She was having 4 days of belching bloating and mild nausea.  She was exercising without issue then developed left-sided jaw pain while sitting and talking to her sister.  She took some Pepcid and Tums and this seemed to help.  She had more jaw pain however after waking up the next day.  Possible reflux or angina.  EKG was unchanged. She is on aspirin.   Right bundle branch block chronic.  She did have an  echocardiogram in 2020 that showed mild LVH no mitral valve prolapse.   Mother died 35 brain aneurysm Brother and Sisiter died heart attacks     Past Medical History:  Diagnosis Date   Allergy    Carotid artery stenosis 07/03/2015   1-39% bilateral by  dopplers 07/2015   Depression    Essential tremor    Hyperlipidemia    Hypertension    Hypothyroidism    Osteopenia    Prinzmetal's angina (HCC)    normal coronary arteries with coronary artery vasospasm at time of cath   RBBB    Chronic    Past Surgical History:  Procedure Laterality Date   CARDIAC CATHETERIZATION  12/09   coronary spasm noted on heart cath. No coronary artery disease. LV function normal   colonoscopy  07/07   ganem- 10 Years   INTRAVASCULAR PRESSURE WIRE/FFR STUDY N/A 07/26/2020   Procedure: INTRAVASCULAR PRESSURE WIRE/FFR STUDY;  Surgeon: Vanessa Man, MD;  Location: [facility];  Service: Cardiovascular;  Laterality: N/A;   KNEE SURGERY     LEFT HEART CATH AND CORONARY ANGIOGRAPHY N/A 07/26/2020   Procedure: LEFT HEART CATH AND CORONARY ANGIOGRAPHY;  Surgeon: Vanessa Man, MD;  Location: [facility];  Service: Cardiovascular;  Laterality: N/A;   THYROIDECTOMY     TOTAL ABDOMINAL HYSTERECTOMY W/ BILATERAL SALPINGOOPHORECTOMY      Current Medications:  Current Meds  Medication Sig   ALPRAZolam (XANAX) 0.5 MG tablet Take 0.5 mg by mouth 2 (two) times daily as needed. (anxiety)   amLODipine (NORVASC) 2.5 MG tablet Take 1 tablet (2.5 mg total) by mouth daily.   aspirin 81 MG tablet Take 81 mg by mouth daily.   Calcium Carbonate-Vitamin D (CALCIUM-VITAMIN D3 PO) Take 1 tablet by mouth daily.   cetirizine (ZYRTEC) 10 MG tablet Take 10 mg by mouth daily.   ezetimibe (ZETIA) 10 MG tablet Take 0.5 tablets (5 mg total) by mouth daily.   levothyroxine (SYNTHROID, LEVOTHROID) 50 MCG tablet Take 50 mcg by mouth daily before breakfast.   lisinopril-hydrochlorothiazide (PRINZIDE,ZESTORETIC) 20-25  MG per tablet Take 1 tablet by mouth daily.   Omega-3 Fatty Acids (FISH OIL PO) Take 1 capsule by mouth daily.   PARoxetine (PAXIL) 20 MG tablet Take 20 mg by mouth daily.   potassium chloride SA (K-DUR,KLOR-CON) 20 MEQ tablet Take 1 tablet (20 mEq total) by mouth 2 (two) times daily. Please make overdue appt with Dr. Radford Stewart before anymore refills. 1st attempt   risedronate (ACTONEL) 35 MG tablet Take 35 mg by mouth every Friday.   rosuvastatin (CRESTOR) 5 MG tablet Take 1 tablet (5 mg total) by mouth daily.     Allergies:   Elemental sulfur, Fosamax [alendronate], Lipitor [atorvastatin], Pravachol [pravastatin], Septra [sulfamethoxazole-trimethoprim], Sulfa antibiotics, Zetia [ezetimibe], and Crestor [rosuvastatin]   Social History   Socioeconomic History   Marital status: Widowed    Spouse name: Not on file   Number of children: Not on file   Years of education: Not on file   Highest education level: Not on file  Occupational History   Not on file  Tobacco Use   Smoking status: Never   Smokeless tobacco: Never  Vaping Use   Vaping Use: Never used  Substance and Sexual Activity   Alcohol use: No    Alcohol/week: 0.0 standard drinks of alcohol   Drug use: No   Sexual activity: Not on file  Other Topics Concern   Not on file  Social History Narrative   Not on file   Social Determinants of Health   Financial Resource Strain: Not on file  Food Insecurity: Not on file  Transportation Needs: Not on file  Physical Activity: Not on file  Stress: Not on file  Social Connections: Not on file     Family History: The patient's family history includes Heart disease in her brother, brother, mother, and sister; Stroke in her father.  ROS:   Please see the history of present illness. All other systems reviewed and are negative.  EKGs/Labs/Other Studies Reviewed:    The following studies were reviewed today:  Vas US carotid Duplex Bilateral  04/28/2021-1 to 39% right, 40 to 59%  left carotid stenosis.  Echo 08/06/21 IMPRESSIONS    1. Left ventricular ejection fraction, by estimation, is 60 to 65%. The  left ventricle has normal function. The left ventricle has no regional  wall motion abnormalities. The left ventricular internal cavity size was  mildly dilated. There is mild left  ventricular hypertrophy of the basal-septal segment. Left ventricular  diastolic parameters are consistent with Grade I diastolic dysfunction  (impaired relaxation).   2. Right ventricular systolic function is normal. The right ventricular  size is normal. There is normal pulmonary artery systolic pressure.   3. Left atrial size was moderately dilated.   4. The mitral valve is normal in structure. Mild to moderate mitral valve  regurgitation.  No evidence of mitral stenosis.   5. The aortic valve is tricuspid. Aortic valve regurgitation is not  visualized. Aortic valve sclerosis/calcification is present, without any  evidence of aortic stenosis.   6. The inferior vena cava is normal in size with greater than 50%  respiratory variability, suggesting right atrial pressure of 3 mmHg.    CT Coronary  07/17/2020 IMPRESSION: 1. Coronary artery calicum score 46 Agatston units. This places the patient in the 51st percentile for age and gender, suggesting intermediate risk for future cardiac events.   2. Discrete calcified mid LAD stenosis, hemodynamically significant by FFR (0.57).  Cardiac catheterization 07/26/2020: Mid LAD-1 lesion is 30% stenosed. Mid LAD-2 lesion is 60% stenosed. RFR 0.91 -> FFR 0.87 (Not significant) Mid LAD to Dist LAD lesion is 30% stenosed. Mid Cx lesion is 60% stenosed. RFR 0.93 (Not significant) LV end diastolic pressure is normal.   SUMMARY Moderate Two-Vessel Disease: Both non-Physiologically significant lesions. Mid LAD discrete eccentric, 60-65% stenosis just prior to branching into equal size major 3rd Diag and distal LAD  CT FFR positive, however  RFR 0.91, FFR 0.87 -> not physiologically significant Focal "ostial " 60% lesion in the LCx just after 1st Mrg --  RFR negative, 0-.93 Normal RCA Normal LVEDP  DG Chest  03/16/2020 IMPRESSION: No acute cardiopulmonary abnormality.  EKG:  EKG showed sinus rhythm, 62 bpm, right bundle branch (RBB), T-wave inversion interior and anterior leads  Recent Labs: 08/11/2021: Hemoglobin 14.2; Platelets 231 08/20/2021: ALT 11; BUN 13; Creatinine, Ser 0.50; Magnesium 2.1; Potassium 3.9; Sodium 142; TSH 1.600  Recent Lipid Panel    Component Value Date/Time   CHOL 205 (H) 08/20/2021 0936   TRIG 91 08/20/2021 0936   HDL 56 08/20/2021 0936   CHOLHDL 3.7 08/20/2021 0936   CHOLHDL 2.2 11/25/2015 1053   VLDL 19 11/25/2015 1053   LDLCALC 133 (H) 08/20/2021 0936   LDLDIRECT 114.0 07/03/2014 1046     Risk Assessment/Calculations:      Physical Exam:    VS:  BP 130/80 (BP Location: Left Arm, Patient Position: Sitting, Cuff Size: Normal)   Pulse 80   Ht '5\' 2"'$  (1.575 m)   Wt 145 lb (65.8 kg)   SpO2 95%   BMI 26.52 kg/m     Wt Readings from Last 3 Encounters:  01/15/22 145 lb (65.8 kg)  10/10/21 141 lb 9.6 oz (64.2 kg)  09/08/21 145 lb (65.8 kg)     GEN: Well nourished, well developed, in no acute distress HEENT: normal Neck: no JVD, carotid bruits, or masses Cardiac: RRR; 2/6 SM,no rubs, or gallops,no edema  Respiratory:  clear to auscultation bilaterally, normal work of breathing GI: soft, nontender, nondistended, + BS MS: no deformity or atrophy Skin: warm and dry, no rash Neuro:  Alert and Oriented x 3, Strength and sensation are intact Psych: euthymic mood, full affect    ASSESSMENT:    1. Mobitz type 1 second degree atrioventricular block   2. Coronary artery disease involving native coronary artery of native heart without angina pectoris   3. RBBB   4. Coronary artery disease involving native heart without angina pectoris, unspecified vessel or lesion type      PLAN:     In order of problems listed above:  Coronary artery disease involving native coronary artery of native heart without angina pectoris Heart catheterization revealed nonflow limiting LAD disease.  Also mid circumflex lesion 60%.  Continue with aggressive medical Crestor 5 and Zetia '5mg'$  management.  Her current LDL is 73.  She is not having any anginal symptoms.  Perfect.  Dr. Brigitte Pulse is checking labs.   Carotid artery stenosis Moderate on the left mild on the right.    Aspirin 81 mg a day.   Hyperlipidemia Lipid clinic.  Rosuvastatin 5 mg a day.  Zetia 5 mg was added on 10/10/2021 by Sw[place]er to see if she would tolerate.  Encouraged Mediterranean-based diet.   RBBB/second-degree heart block type I, high degree AV block Overall doing well.  She saw Dr. Caryl Stewart.  July 2023 admission.  Diltiazem was stopped.  No further episodes of bradycardia since then.  All of her bradycardia was noted at night.  This is all resolved since stopping the diltiazem.  Excellent.    Prinzmetal angina ([place]) This was diagnosed several years ago.  Heart catheterization as above.   Mild to moderate mitral regurgitation - Echocardiogram 08/06/2021 reviewed.  Asymptomatic.  Checking echo next year.  Murmur heard on exam.  Medication Adjustments/Labs and Tests Ordered: Current medicines are reviewed at length with the patient today.  Concerns regarding medicines are outlined above.  No orders of the defined types were placed in this encounter.   No orders of the defined types were placed in this encounter.  There are no Patient Instructions on file for this visit.    Signed, Vanessa Furbish, MD  01/15/2022 10:38 AM    [facility]

## 2022-01-15 NOTE — Patient Instructions (Signed)
Medication Instructions:  The current medical regimen is effective;  continue present plan and medications.  *If you need a refill on your cardiac medications before your next appointment, please call your pharmacy*  Follow-Up: At [facility], you and your health needs are our priority.  As part of our continuing mission to provide you with exceptional heart care, we have created designated Provider Care Teams.  These Care Teams include your primary Cardiologist (physician) and Advanced Practice Providers (APPs -  Physician Assistants and Nurse Practitioners) who all work together to provide you with the care you need, when you need it.  We recommend signing up for the patient portal called "MyChart".  Sign up information is provided on this After Visit Summary.  MyChart is used to connect with patients for Virtual Visits (Telemedicine).  Patients are able to view lab/test results, encounter notes, upcoming appointments, etc.  Non-urgent messages can be sent to your provider as well.   To learn more about what you can do with MyChart, go to [email or web address].    Your next appointment:   1 year(s)  The format for your next appointment:   In Person  Provider:   Mark Skains, MD      Important Information About Sugar       

## 2022-01-28 DIAGNOSIS — M1711 Unilateral primary osteoarthritis, right knee: Secondary | ICD-10-CM | POA: Diagnosis not present

## 2022-02-23 DIAGNOSIS — Q828 Other specified congenital malformations of skin: Secondary | ICD-10-CM | POA: Diagnosis not present

## 2022-02-23 DIAGNOSIS — D2272 Melanocytic nevi of left lower limb, including hip: Secondary | ICD-10-CM | POA: Diagnosis not present

## 2022-02-23 DIAGNOSIS — L821 Other seborrheic keratosis: Secondary | ICD-10-CM | POA: Diagnosis not present

## 2022-02-23 DIAGNOSIS — D1801 Hemangioma of skin and subcutaneous tissue: Secondary | ICD-10-CM | POA: Diagnosis not present

## 2022-02-23 DIAGNOSIS — D485 Neoplasm of uncertain behavior of skin: Secondary | ICD-10-CM | POA: Diagnosis not present

## 2022-02-23 DIAGNOSIS — D2362 Other benign neoplasm of skin of left upper limb, including shoulder: Secondary | ICD-10-CM | POA: Diagnosis not present

## 2022-02-23 DIAGNOSIS — D2372 Other benign neoplasm of skin of left lower limb, including hip: Secondary | ICD-10-CM | POA: Diagnosis not present

## 2022-02-25 DIAGNOSIS — R519 Headache, unspecified: Secondary | ICD-10-CM | POA: Diagnosis not present

## 2022-02-25 DIAGNOSIS — G43909 Migraine, unspecified, not intractable, without status migrainosus: Secondary | ICD-10-CM | POA: Diagnosis not present

## 2022-02-25 DIAGNOSIS — G44219 Episodic tension-type headache, not intractable: Secondary | ICD-10-CM | POA: Diagnosis not present

## 2022-02-25 DIAGNOSIS — I671 Cerebral aneurysm, nonruptured: Secondary | ICD-10-CM | POA: Diagnosis not present

## 2022-02-25 DIAGNOSIS — I6782 Cerebral ischemia: Secondary | ICD-10-CM | POA: Diagnosis not present

## 2022-03-05 DIAGNOSIS — G44219 Episodic tension-type headache, not intractable: Secondary | ICD-10-CM | POA: Diagnosis not present

## 2022-03-17 ENCOUNTER — Telehealth: Payer: Self-pay | Admitting: Cardiology

## 2022-03-17 NOTE — Telephone Encounter (Signed)
Pt c/o medication issue:  1. Name of Medication: montelukast '10mg'$   2. How are you currently taking this medication (dosage and times per day)? Takes one every night  3. Are you having a reaction (difficulty breathing--STAT)?   4. What is your medication issue? Patient was prescribed this medication for her allergies.  She wants to know if she can take this medication.

## 2022-03-17 NOTE — Telephone Encounter (Signed)
Per Dr Marlou Porch - OK for pt to take.  She is aware and was appreciative of the call back and information.

## 2022-03-18 ENCOUNTER — Telehealth: Payer: Self-pay | Admitting: Cardiology

## 2022-03-18 DIAGNOSIS — R519 Headache, unspecified: Secondary | ICD-10-CM | POA: Diagnosis not present

## 2022-03-18 DIAGNOSIS — R9089 Other abnormal findings on diagnostic imaging of central nervous system: Secondary | ICD-10-CM | POA: Diagnosis not present

## 2022-03-18 DIAGNOSIS — R930 Abnormal findings on diagnostic imaging of skull and head, not elsewhere classified: Secondary | ICD-10-CM | POA: Diagnosis not present

## 2022-03-18 NOTE — Telephone Encounter (Signed)
Returned call to patient.  She states one of her doctors would like to start her on topiramate for headaches. Patient would like to know if it is OK for her to take this medication with her heart condition and other medications.  She specifically wants to know if Dr. Marlou Porch says whether or not it is OK to take.  Will forward to Dr. Marlou Porch and Pharm D to review and advise.  Patient verbalized understanding and expressed appreciation for assistance.

## 2022-03-18 NOTE — Telephone Encounter (Signed)
Pt c/o medication issue:  1. Name of Medication: Topairamate   2. How are you currently taking this medication (dosage and times per day)? Hasn't started   3. Are you having a reaction (difficulty breathing--STAT)? No  4. What is your medication issue?   Pt states she is returning call and is requesting return call, but she would like to know if this medication prescribed to her by another doctor will be okay to take. Requesting a call back to confirm.

## 2022-03-18 NOTE — Telephone Encounter (Signed)
No major drug interactions other than potential hypokalemia with HCTZ (on KCL supplements, last KCL 4.4) and sedation concerns with alprazolam and drug itself.  Will let Dr. Marlou Porch give his option since this is what patient desires.

## 2022-03-20 NOTE — Telephone Encounter (Signed)
Pt is returning call. Transferred call to Rosann Auerbach, Therapist, sports.

## 2022-03-20 NOTE — Telephone Encounter (Signed)
Patient was calling for update. Please advise

## 2022-03-20 NOTE — Telephone Encounter (Signed)
Attempted phone call to pt and left voicemail message to contact office at [phone].

## 2022-03-20 NOTE — Telephone Encounter (Signed)
Spoke with Vanessa Stewart and advised per Dr Marlou Porch Vanessa Stewart may start Topiramate.  Vanessa Stewart verbalized understanding and thanked Therapist, sports.

## 2022-03-26 DIAGNOSIS — I671 Cerebral aneurysm, nonruptured: Secondary | ICD-10-CM | POA: Diagnosis not present

## 2022-03-26 DIAGNOSIS — R930 Abnormal findings on diagnostic imaging of skull and head, not elsewhere classified: Secondary | ICD-10-CM | POA: Diagnosis not present

## 2022-03-26 DIAGNOSIS — I771 Stricture of artery: Secondary | ICD-10-CM | POA: Diagnosis not present

## 2022-03-26 DIAGNOSIS — I6782 Cerebral ischemia: Secondary | ICD-10-CM | POA: Diagnosis not present

## 2022-03-26 DIAGNOSIS — I6529 Occlusion and stenosis of unspecified carotid artery: Secondary | ICD-10-CM | POA: Diagnosis not present

## 2022-03-26 DIAGNOSIS — I728 Aneurysm of other specified arteries: Secondary | ICD-10-CM | POA: Diagnosis not present

## 2022-03-30 ENCOUNTER — Telehealth: Payer: Self-pay | Admitting: Cardiology

## 2022-03-30 NOTE — Telephone Encounter (Signed)
Patient thinks she accidentally took two of her bp medication. Wants to make sure she will be okay. Please advise

## 2022-03-30 NOTE — Telephone Encounter (Signed)
Pt reports she thinks she may had taken an extra Amlodipine by mistake.  Advised to monitor blood pressure today and if she becomes dizzy.   Also advised to make sure she stays well hydrated.  She was appreciative of the call back and information.  She will call back if any questions/concerns.

## 2022-04-06 DIAGNOSIS — M542 Cervicalgia: Secondary | ICD-10-CM | POA: Diagnosis not present

## 2022-04-06 DIAGNOSIS — R69 Illness, unspecified: Secondary | ICD-10-CM | POA: Diagnosis not present

## 2022-04-06 DIAGNOSIS — G43909 Migraine, unspecified, not intractable, without status migrainosus: Secondary | ICD-10-CM | POA: Diagnosis not present

## 2022-04-06 DIAGNOSIS — H6990 Unspecified Eustachian tube disorder, unspecified ear: Secondary | ICD-10-CM | POA: Diagnosis not present

## 2022-04-14 ENCOUNTER — Other Ambulatory Visit: Payer: Self-pay

## 2022-04-14 DIAGNOSIS — I6522 Occlusion and stenosis of left carotid artery: Secondary | ICD-10-CM

## 2022-05-02 DIAGNOSIS — M542 Cervicalgia: Secondary | ICD-10-CM | POA: Diagnosis not present

## 2022-05-04 ENCOUNTER — Ambulatory Visit: Payer: Medicare HMO | Admitting: Physician Assistant

## 2022-05-04 ENCOUNTER — Ambulatory Visit (HOSPITAL_COMMUNITY)
Admission: RE | Admit: 2022-05-04 | Discharge: 2022-05-04 | Disposition: A | Discharge status: Home / Self Care | Payer: Medicare HMO | Source: Ambulatory Visit | Attending: Surgery | Admitting: Surgery

## 2022-05-04 VITALS — BP 136/80 | HR 70 | Temp 97.9°F | Resp 14 | Ht 62.0 in | Wt 143.0 lb

## 2022-05-04 DIAGNOSIS — I6522 Occlusion and stenosis of left carotid artery: Secondary | ICD-10-CM

## 2022-05-05 NOTE — Progress Notes (Signed)
Office Note   History of Present Illness   Vanessa Stewart is a 77 y.o. (Feb 12, 1945) female who presents for surveillance of carotid artery stenosis. She has no previous history of CVA or TIA.  She denies any strokelike symptoms such as slurred speech, facial droop, sudden changes to vision, or sudden weakness/numbness.   She is still taking an aspirin and statin daily.  Current Outpatient Medications  Medication Sig Dispense Refill   ALPRAZolam (XANAX) 0.5 MG tablet Take 0.5 mg by mouth 2 (two) times daily as needed. (anxiety)     amLODipine (NORVASC) 2.5 MG tablet Take 1 tablet (2.5 mg total) by mouth daily. 30 tablet 11   aspirin 81 MG tablet Take 81 mg by mouth daily.     Calcium Carbonate-Vitamin D (CALCIUM-VITAMIN D3 PO) Take 1 tablet by mouth daily.     cetirizine (ZYRTEC) 10 MG tablet Take 10 mg by mouth daily.     ezetimibe (ZETIA) 10 MG tablet Take 0.5 tablets (5 mg total) by mouth daily. 45 tablet 3   levothyroxine (SYNTHROID, LEVOTHROID) 50 MCG tablet Take 50 mcg by mouth daily before breakfast.     lisinopril-hydrochlorothiazide (PRINZIDE,ZESTORETIC) 20-25 MG per tablet Take 1 tablet by mouth daily.     Omega-3 Fatty Acids (FISH OIL PO) Take 1 capsule by mouth daily.     PARoxetine (PAXIL) 20 MG tablet Take 20 mg by mouth daily.     potassium chloride SA (K-DUR,KLOR-CON) 20 MEQ tablet Take 1 tablet (20 mEq total) by mouth 2 (two) times daily. Please make overdue appt with Dr. Radford Pax before anymore refills. 1st attempt 60 tablet 0   risedronate (ACTONEL) 35 MG tablet Take 35 mg by mouth every Friday.     rosuvastatin (CRESTOR) 5 MG tablet Take 1 tablet (5 mg total) by mouth daily. 30 tablet 11   No current facility-administered medications for this visit.    REVIEW OF SYSTEMS (negative unless checked):   Cardiac:  []  Chest pain or chest pressure? []  Shortness of breath upon activity? []  Shortness of breath when lying flat? []  Irregular heart rhythm?  Vascular:   []  Pain in calf, thigh, or hip brought on by walking? []  Pain in feet at night that wakes you up from your sleep? []  Blood clot in your veins? []  Leg swelling?  Pulmonary:  []  Oxygen at home? []  Productive cough? []  Wheezing?  Neurologic:  []  Sudden weakness in arms or legs? []  Sudden numbness in arms or legs? []  Sudden onset of difficult speaking or slurred speech? []  Temporary loss of vision in one eye? []  Problems with dizziness?  Gastrointestinal:  []  Blood in stool? []  Vomited blood?  Genitourinary:  []  Burning when urinating? []  Blood in urine?  Psychiatric:  []  Major depression  Hematologic:  []  Bleeding problems? []  Problems with blood clotting?  Dermatologic:  []  Rashes or ulcers?  Constitutional:  []  Fever or chills?  Ear/Nose/Throat:  []  Change in hearing? []  Nose bleeds? []  Sore throat?  Musculoskeletal:  []  Back pain? []  Joint pain? []  Muscle pain?   Physical Examination   Vitals:   05/04/22 1044  BP: 136/80  Pulse: 70  Resp: 14  Temp: 97.9 F (36.6 C)  TempSrc: Temporal  SpO2: 97%  Weight: 143 lb (64.9 kg)  Height: 5\' 2"  (1.575 m)   Body mass index is 26.16 kg/m.  General:  WDWN in NAD; vital signs documented above Gait: Not observed HENT: WNL, normocephalic Pulmonary: normal non-labored breathing Cardiac: regular  Abdomen: soft, NT, no masses Skin: without rashes Vascular Exam/Pulses: 2+ radial and DP pulses bilaterally Extremities: without ischemic changes, gangrene, or open wounds Musculoskeletal: no muscle wasting or atrophy  Neurologic: A&O X 3;  No focal weakness or paresthesias are detected Psychiatric:  The pt has Normal affect.  Non-Invasive Vascular Imaging   B Carotid Duplex (05/04/2022):  R ICA stenosis:  1-39% R VA:  patent and antegrade L ICA stenosis:  40-59% L VA:  patent and antegrade   Medical Decision Making   Vanessa Stewart is a 77 y.o. female who presents for surveillance of carotid artery  stenosis  Based on the patient's vascular studies, her carotid artery stenosis is unchanged bilaterally. There is right ICA stenosis of 1-39% and left ICA stenosis of 40-59% She denies any strokelike symptoms such as amaurosis fugax, facial droop, or sudden weakness/numbness She has palpable and equal radial and DP pulses bilaterally She can continue her aspirin and statin and follow up with our office in 1 year with repeat carotid duplex   Vicente Serene PA-C [facility] Office: [facility] MD: Trula Slade

## 2022-05-06 ENCOUNTER — Other Ambulatory Visit: Payer: Self-pay | Admitting: Cardiology

## 2022-05-12 ENCOUNTER — Telehealth: Payer: Self-pay | Admitting: Cardiology

## 2022-05-12 NOTE — Telephone Encounter (Signed)
Pt calling today concerned about her HR.  Typically in stays between 70-80 bpm but over the past week (maybe 2) it has been running between 90 to 104 bpm.  She reports once it was 115 but didn't stay that way.  HR returns to her normal in the evenings.  BP has been OK - just one day it was 140/85. She reports she has recently been sick with cough and congestion.  She has taken Mucinex a few times but the "one that is OK to take with heart issues"  No decongestants.  She denies any caffeine use, no new over the counter mediations, and has been trying to drink plenty of water.  A few times her urine has been yellow but is usually clear.  She has been under more stress recently has her sister passed away.  Advised to continue to monitor her VS and stay hydrated.  Aware I will forward this information to Dr Marlou Porch for review and will call back with any new orders/recommendation.

## 2022-05-12 NOTE — Telephone Encounter (Signed)
STAT if HR is under 50 or over 120 (normal HR is 60-100 beats per minute)  What is your heart rate? 104  Do you have a log of your heart rate readings (document readings)? NA  Do you have any other symptoms? No  Pt states that she has been sick the last fes days. She does not know if that has anything to do with HR being elevated

## 2022-05-13 ENCOUNTER — Telehealth: Payer: Self-pay | Admitting: Physician Assistant

## 2022-05-13 DIAGNOSIS — M25561 Pain in right knee: Secondary | ICD-10-CM | POA: Diagnosis not present

## 2022-05-13 NOTE — Telephone Encounter (Signed)
STAT if HR is under 50 or over 120 (normal HR is 60-100 beats per minute)  What is your heart rate? She said right now it's in the 80's right now.   Do you have a log of your heart rate readings (document readings)? Went up to 138 when she was laying in bed resting, that is what her watch showed.   Do you have any other symptoms? Patient states she has no symptoms.  Patient states she had a shot in her knee.

## 2022-05-13 NOTE — Telephone Encounter (Signed)
Called patient back. Patient stated her HR went as high as 138 and now is back down to 80. Patient stated she thinks it's due to her shot she had in her knee. Informed patient that a steroid shot could cause her HR to go up. Also, patient shared that her sister just passed. Informed patient the stress of there sister passing might be making her HR go back up as well. Patient wanted to know if she needs to go to the hospital. Informed patient that as long as her heart rate comes back down and she has no other symptoms, and we know what is possibly causing the heart rate to go up, then she does not need to go the the ED. Patient also called in yesterday with similar issues. Will send this message along with her previous message to Dr. Marlou Porch for advisement.

## 2022-05-13 NOTE — Telephone Encounter (Signed)
   The patient called the answering service after-hours today. H/o CAD, 2nd degree AVB type 1, bradycardia, RBBB, HTN, HLD. Has called in yesterday and today with concerns about intermittent abnormal HR intermittently shooting up which is new for her. Highest HR was 138 but comes back down. Per  prior nurse notes she did not think she needed to go to the ED but wanted MD input. She has not yet heard back from Dr. Marlou Porch and is unhappy with this. She wanted me to let him know she called again. I explained that sometimes our physicians are out of the office or do not get an opportunity to review the message in real time. We discussed that the only way to diagnose the irregular heart rhythm would be in person evaluation with EKG. She otherwise feels OK - just fatigued - so declines to go to the ER tonight. She plans to call the office in the AM to request an appt. She will go to the ER for worsening symptoms.  The patient verbalized understanding and gratitude.  Charlie Pitter, PA-C

## 2022-05-14 NOTE — Progress Notes (Signed)
Cardiology Office Note:    Date:  05/15/2022   ID:  Vanessa Stewart, DOB 03-02-1945, MRN [id number]  PCP:  Lupita RaiderShaw, Kimberlee, MD   [facility] Providers Cardiologist:  Donato SchultzMark Skains, MD     Referring MD: Lupita RaiderShaw, Kimberlee, MD   Chief Complaint: Follow-up hypertension, hyperlipidemia  History of Present Illness:    Vanessa Stewart is a very pleasant 77 y.o. female with a hx of CAD, carotid artery stenosis, hypertension, RBBB, Prinzmetal's angina, and hyperlipidemia.   Established care with cardiology prior to 2015 and requested a provider switch to Dr. Anne FuSkains 03/2020. She had a low risk Myoview in 2019.   She had cardiac catheterization after abnormal coronary CT scan. She was noted to have circumflex and LAD moderate nonflow limiting disease on cath assessment FFR, medical management.  She has a moderate carotid artery stenosis on the left, mild on the right  Seen in clinic on 01/20/2021 by Dr. Anne FuSkains at which time her Crestor was increased to 10 mg due to LDL above goal of 70. She called back to report she was already taking 10 mg and dose was increased to 20 mg.  LDL on 05/14/2021 improved to 76 but remained above goal, Zetia 10 mg was added.   Seen by me on 07/10/21 for follow-up. Had no specific cardiac concerns. Was not currently exercising on a consistent basis, 2/2 left foot pain for which she is seeing orthopedics. Planning to return to water aerobics soon. Admits to eating out on a consistent basis, but tries to monitor sodium. She  denied chest pain, shortness of breath, lower extremity edema, fatigue, palpitations, melena, hematuria, hemoptysis, diaphoresis, weakness, presyncope, syncope, orthopnea, and PND. Occasional bilateral hand swelling that she is concerned is associated with starting Zetia.  She called our office on 07/21/2021 to report pain in her hands that was attributed to rosuvastatin.  Referred to lipid clinic and seen by Margaretmary DysMegan Supple, RPH on 09/16/2021 at which time she was  taking ezetimibe 10 mg.  She reported she still had some muscle aches in her thighs.  She was advised to monitor for symptom improvement and once feeling better start rosuvastatin 5 mg daily.  If she tolerated rosuvastatin at lower dose, would reduce ezetimibe to 5 mg as well. Plan for 2-3 month follow-up.   Seen by Sharlene DoryElizabeth Peck, NP on 08/20/21 following hospitalization 08/10/2021 for Mobitz type I AV heart block and hypertension.  She noted HR in the 40s on her Apple Watch the evening prior and took herself to the ED on 08/10/2021.  She had some mild neck pain but otherwise no significant symptoms.  Was found to be in second-degree block type I, was asymptomatic with this.  Her potassium was 3.0 and she received potassium replacement in the hospital, otherwise labs were stable.  Her diltiazem was stopped and arrangements were made for 3-day monitor.  She was started on amlodipine 2.5 mg daily for high blood pressure.  EKG revealed NSR at 67 bpm with a right bundle branch block.  She continued to note drops in HR to 30s, asymptomatic. Cardiac monitor revealed high degree AV block. She was referred to EP for pacemaker evaluation.  Seen by Dr. Graciela HusbandsKlein on 09/08/2021.  Monitor showed consecutively dropped P waves but all the bradycardia was at night.  He noted her to have sleep disordered breathing and daytime somnolence.  Seen in clinic by me on 10/10/21. Reported no decrease in HR since seeing Dr. Graciela HusbandsKlein on 7/31. Data from her Apple  Watch reviewed which revealed no HR < 50 bpm since 09/05/21. Feeling well and has had no symptoms with bradycardia since the initial diagnosis 7/2. Is concerned that she may need knee surgery in the future, wants to know if she will be able to get clearance. No chest pain, shortness of breath, lower extremity edema, fatigue, palpitations, melena, hematuria, hemoptysis, diaphoresis, weakness, presyncope, syncope, orthopnea, and PND. Is working on eating better so that LDL will be better. Is  tolerating rosuvastatin 5 mg daily. She was advised to resume Zetia 5 mg daily and have follow-up labs prior to office visit with Dr. Anne Fu in 3 months.   Seen by Dr. Anne Fu on 01/15/22 in clinic. Lab report from PCP 12/30/21 revealed LDL 73, trigs 93.  She had no specific cardiac concerns and 1 year follow-up was recommended.  Had carotid u/s and follow-up with [facility] 05/04/22.  There was no change in carotid stenosis and no strokelike symptoms. She was advised to continue aspirin and statin and follow-up in 1 year.   She contacted our on-call provider on 05/13/2022 with concerns for elevated HR up to 138 bpm. Otherwise feeling okay just a little fatigued.  Today, she is here for evaluation of irregular HR. Is upset that no one got back with her for 2 days and she had to call on-call provider. Reports elevated HR 4 days ago and another time recently, prior to that day. Has had respiratory infection with cough. Had a steroid injection in right knee a few days ago. Also upset about the recent death of her sister. Notes that BP is elevated when she does not sleep well. Asks about checking it multiple times in a row. No episodes of slow HR, lowest she has noted is 58 bpm. She denies chest pain, shortness of breath, lower extremity edema, fatigue, diaphoresis, weakness, presyncope, syncope, orthopnea, and PND.  Past Medical History:  Diagnosis Date   Allergy    Carotid artery stenosis 07/03/2015   1-39% bilateral by  dopplers 07/2015   Depression    Essential tremor    Hyperlipidemia    Hypertension    Hypothyroidism    Osteopenia    Prinzmetal's angina    normal coronary arteries with coronary artery vasospasm at time of cath   RBBB    Chronic    Past Surgical History:  Procedure Laterality Date   CARDIAC CATHETERIZATION  12/09   coronary spasm noted on heart cath. No coronary artery disease. LV function normal   colonoscopy  07/07   ganem- 10 Years   CORONARY PRESSURE/FFR STUDY N/A 07/26/2020    Procedure: INTRAVASCULAR PRESSURE WIRE/FFR STUDY;  Surgeon: Marykay Lex, MD;  Location[facility] INVASIVE CV LAB;  Service: Cardiovascular;  Laterality: N/A;   KNEE SURGERY     LEFT HEART CATH AND CORONARY ANGIOGRAPHY N/A 07/26/2020   Procedure: LEFT HEART CATH AND CORONARY ANGIOGRAPHY;  Surgeon: Marykay Lex, MD;  Location: [facility] INVASIVE CV LAB;  Service: Cardiovascular;  Laterality: N/A;   THYROIDECTOMY     TOTAL ABDOMINAL HYSTERECTOMY W/ BILATERAL SALPINGOOPHORECTOMY      Current Medications: Current Meds  Medication Sig   ALPRAZolam (XANAX) 0.5 MG tablet Take 0.5 mg by mouth 2 (two) times daily as needed. (anxiety)   amLODipine (NORVASC) 2.5 MG tablet Take 1 tablet (2.5 mg total) by mouth daily.   aspirin 81 MG tablet Take 81 mg by mouth daily.   Calcium Carbonate-Vitamin D (CALCIUM-VITAMIN D3 PO) Take 1 tablet by mouth daily.  cetirizine (ZYRTEC) 10 MG tablet Take 10 mg by mouth daily.   ezetimibe (ZETIA) 10 MG tablet Take 1 tablet by mouth once daily   levothyroxine (SYNTHROID, LEVOTHROID) 50 MCG tablet Take 50 mcg by mouth daily before breakfast.   lisinopril-hydrochlorothiazide (PRINZIDE,ZESTORETIC) 20-25 MG per tablet Take 1 tablet by mouth daily.   Omega-3 Fatty Acids (FISH OIL PO) Take 1 capsule by mouth daily.   PARoxetine (PAXIL) 20 MG tablet Take 20 mg by mouth daily.   potassium chloride SA (K-DUR,KLOR-CON) 20 MEQ tablet Take 1 tablet (20 mEq total) by mouth 2 (two) times daily. Please make overdue appt with Dr. Mayford Knife before anymore refills. 1st attempt   propranolol (INDERAL) 10 MG tablet Take 1 tablet (10 mg total) by mouth 3 (three) times daily.   risedronate (ACTONEL) 35 MG tablet Take 35 mg by mouth every Friday.   rosuvastatin (CRESTOR) 5 MG tablet Take 1 tablet (5 mg total) by mouth daily.     Allergies:   Elemental sulfur, Fosamax [alendronate], Lipitor [atorvastatin], Pravachol [pravastatin], Septra [sulfamethoxazole-trimethoprim], Sulfa antibiotics, Zetia  [ezetimibe], and Crestor [rosuvastatin]   Social History   Socioeconomic History   Marital status: Widowed    Spouse name: Not on file   Number of children: Not on file   Years of education: Not on file   Highest education level: Not on file  Occupational History   Not on file  Tobacco Use   Smoking status: Never   Smokeless tobacco: Never  Vaping Use   Vaping Use: Never used  Substance and Sexual Activity   Alcohol use: No    Alcohol/week: 0.0 standard drinks of alcohol   Drug use: No   Sexual activity: Not on file  Other Topics Concern   Not on file  Social History Narrative   Not on file   Social Determinants of Health   Financial Resource Strain: Not on file  Food Insecurity: Not on file  Transportation Needs: Not on file  Physical Activity: Not on file  Stress: Not on file  Social Connections: Not on file     Family History: The patient's family history includes Heart disease in her brother, brother, mother, and sister; Stroke in her father.  ROS:   Please see the history of present illness.   All other systems reviewed and are negative.  Labs/Other Studies Reviewed:    The following studies were reviewed today:  VAS US Carotid 05/04/22 Summary:  Right Carotid: Velocities in the right ICA are consistent with a 1-39%  stenosis.   Left Carotid: Velocities in the left ICA are consistent with a 40-59%  stenosis.               Stenosis likely due to hairpin tortuosity.   Vertebrals:  Bilateral vertebral arteries demonstrate antegrade flow.  Subclavians: Normal flow hemodynamics were seen in bilateral subclavian               arteries.   *See table(s) above for measurements and observations.      VAS US Carotid 04/28/21  Right Carotid: Velocities in the right ICA are consistent with a 1-39%  stenosis.   Left Carotid: Velocities in the left distal ICA are consistent with a  40-59%               stenosis (area of tortuosity).   Vertebrals:   Bilateral vertebral arteries demonstrate antegrade flow.  Subclavians: Normal flow hemodynamics were seen in bilateral subclavian  arteries.   LHC 07/26/20  Mid LAD-1 lesion is 30% stenosed. Mid LAD-2 lesion is 60% stenosed. RFR 0.91 -> FFR 0.87 (Not significant) Mid LAD to Dist LAD lesion is 30% stenosed. Mid Cx lesion is 60% stenosed. RFR 0.93 (Not significant) LV end diastolic pressure is normal.   SUMMARY Moderate Two-Vessel Disease: Both non-Physiologically significant lesions. Mid LAD discrete eccentric, 60-65% stenosis just prior to branching into equal size major 3rd Diag and distal LAD  CT FFR positive, however RFR 0.91, FFR 0.87 -> not physiologically significant Focal "ostial " 60% lesion in the LCx just after 1st Mrg --  RFR negative, 0-.93 Normal RCA Normal LVEDP     RECOMMENDATIONS Aggressive Guideline Directed Medical Management for moderate CAD   Coronary CTA 07/18/20  1. Coronary artery calicum score 46 Agatston units. This places the patient in the 51st percentile for age and gender, suggesting intermediate risk for future cardiac events.   2. Discrete calcified mid LAD stenosis, hemodynamically significant by FFR (0.57).   The LAD stenosis could be artifactual and Heartflow may be modeling artifact (Flash scan protocol was used). If patient does not have suggestive symptoms of angina, would consider Cardiolite versus coronary CTA using the regular protocol to confirm.   1. Left Main: No significant stenosis.   2. LAD: FFR 0.57 mid LAD. 3. LCX: FFR 0.56 far distal LCx. There is significant artifact on the CTA images in the distal LCx so not sure of significance of this finding. 4. RCA: No significant stenosis.   IMPRESSION: 1. CT FFR analysis suggests hemodynamically significant mid LAD stenosis. However, Flash protocol used with artifact noted in the mid LAD. It is possible that the CT FFR is modeling artifact. If the patient has  anginal symptoms, would consider proceeding with cath. If symptoms are not suggestive of angina, consider doing either a Cardiolite or repeat coronary CT by the usual protocol.   Echo 02/01/19   1. Left ventricular ejection fraction, by visual estimation, is 60 to  65%. The left ventricle has normal function. There is mildly increased  left ventricular hypertrophy.   2. Left ventricular diastolic parameters are indeterminate.   3. The left ventricle has no regional wall motion abnormalities.   4. Global right ventricle has normal systolic function.The right  ventricular size is normal. No increase in right ventricular wall  thickness.   5. Left atrial size was normal.   6. Right atrial size was normal.   7. The mitral valve is normal in structure. Trivial mitral valve  regurgitation. No evidence of mitral stenosis.   8. No significant mitral valve prolapse appreciated.   9. The tricuspid valve is normal in structure.  10. The aortic valve is tricuspid. Aortic valve regurgitation is not  visualized. Mild aortic valve sclerosis without stenosis.  11. The pulmonic valve was normal in structure. Pulmonic valve  regurgitation is trivial.  12. Mildly elevated pulmonary artery systolic pressure.  13. The inferior vena cava is normal in size with greater than 50%  respiratory variability, suggesting right atrial pressure of 3 mmHg.    Exercise Myoview 02/24/17  Nuclear stress EF: 64%. The study is normal. This is a low risk study. The left ventricular ejection fraction is normal (55-65%). Blood pressure demonstrated a normal response to exercise. There was no ST segment deviation noted during stress from baseline EKG changes due to RBBB.   Finalized by Quintella Reichert, MD on Wed Feb 24, 2017  3:37 PM Nuclear stress EF: 64%. The  study is normal. This is a low risk study. The left ventricular ejection fraction is normal (55-65%).  Recent Labs: 08/11/2021: Hemoglobin 14.2; Platelets  231 08/20/2021: ALT 11; BUN 13; Creatinine, Ser 0.50; Magnesium 2.1; Potassium 3.9; Sodium 142; TSH 1.600  Recent Lipid Panel    Component Value Date/Time   CHOL 205 (H) 08/20/2021 0936   TRIG 91 08/20/2021 0936   HDL 56 08/20/2021 0936   CHOLHDL 3.7 08/20/2021 0936   CHOLHDL 2.2 11/25/2015 1053   VLDL 19 11/25/2015 1053   LDLCALC 133 (H) 08/20/2021 0936   LDLDIRECT 114.0 07/03/2014 1046     Risk Assessment/Calculations:         Physical Exam:    VS:  BP 126/76   Pulse 76   Ht 5\' 2"  (1.575 m)   Wt 143 lb (64.9 kg)   SpO2 99%   BMI 26.16 kg/m     Wt Readings from Last 3 Encounters:  05/15/22 143 lb (64.9 kg)  05/04/22 143 lb (64.9 kg)  01/15/22 145 lb (65.8 kg)     GEN:  Well nourished, well developed in no acute distress HEENT: Normal NECK: No JVD; No carotid bruits CARDIAC: RRR, 2/6 murmur RUSB. No rubs, gallops RESPIRATORY:  Clear to auscultation without rales, wheezing or rhonchi  ABDOMEN: Soft, non-tender, non-distended MUSCULOSKELETAL:  No edema; No deformity. 2+ pedal pulses, equal bilaterally SKIN: Warm and dry NEUROLOGIC:  Alert and oriented x 3 PSYCHIATRIC:  Normal affect   EKG:  EKG is ordered today and demonstrates normal sinus rhythm at 76 bpm, RBBB, no acute change from previous tracing  Diagnoses:    1. Palpitations   2. Bilateral carotid artery stenosis   3. RBBB   4. Coronary artery disease involving native coronary artery of native heart without angina pectoris   5. Nonrheumatic mitral valve regurgitation   6. Hyperlipidemia LDL goal <70   7. Mobitz type 1 second degree atrioventricular block   8. Bradycardia   9. Essential hypertension   10. Tachycardia      Assessment and Plan:     Palpitations/Tachycardia: Recent episode of HR 130s. This was not sustained.  No presyncope, syncope. Had been ill with cold symptoms, had right knee pain and received a steroid injection in her knee, all of which could have contributed to elevated HR.  Wears Apple watch - thinks this contributes to anxiety about HR. Has sister's funeral coming up this weekend. Will give her propranolol 10 mg to take as needed for palpitations/tachycardia.   Bradycardia/2nd degree heart block: History of bradycardia, second-degree heart block has been called during admission 08/2021. Diltiazem was stopped. Seen 09/08/21 by Dr. Graciela Husbands, EP. HR is stable today. No further episodes of bradycardia since that time.  Mild to moderate MR: Echo 07/2021 revealed mild to moderate mitral valve regurgitation, no evidence of stenosis. Has occasional episodes of palpitations/tachycardia. No significant murmur on exam. Continue to monitor clinically at this time.   CAD without angina/Prinzmetal angina: Cardiac cath 07/2020 revealed moderate two-vessel disease both non-physiologically significant lesions, recommendation for aggressive medical therapy. She denies chest pain, dyspnea, or other symptoms concerning for angina. No indication for further ischemic evaluation at this time. Continue statin, amlodipine, asa, lisinopril.    Hyperlipidemia LDL goal < 70: LDL 73 on 12/2021.  Continue ezetimibe, rosuvastatin.  Hypertension: BP initially elevated but improved upon recheck. Occasional elevated home BP readings she feels are 2/2 not sleeping well. Continue to monitor and report consistent BP > 140 or > 80.  Stable renal function on labs 12/2021. Low sodium diet encouraged. Continue lisinopril-hctz, amlodipine.   Carotid artery disease: Mild stenosis right ICA 1-39%, moderate stenosis left ICA 40-59% by u/s 04/2022, no significant change from previous. She is asymptomatic. Management per [facility] with plan to repeat carotid duplex in 1 year.     Disposition: 6 months with Dr. Anne FuSkains or me   Medication Adjustments/Labs and Tests Ordered: Current medicines are reviewed at length with the patient today.  Concerns regarding medicines are outlined above.  Orders Placed This Encounter   Procedures   EKG 12-Lead   Meds ordered this encounter  Medications   propranolol (INDERAL) 10 MG tablet    Sig: Take 1 tablet (10 mg total) by mouth 3 (three) times daily.    Dispense:  30 tablet    Refill:  3    Order Specific Question:   Supervising Provider    Answer:   Vesta MixerNAHSER, PHILIP J [phone][[id number]]    Patient Instructions  Medication Instructions:   START Propranolol one (1) tablet ( 10 mg ) by mouth as needed up to three times daily for palpitations.   *If you need a refill on your cardiac medications before your next appointment, please call your pharmacy*   Lab Work:  None ordered.  If you have labs (blood work) drawn today and your tests are completely normal, you will receive your results only by: MyChart Message (if you have MyChart) OR A paper copy in the mail If you have any lab test that is abnormal or we need to change your treatment, we will call you to review the results.   Testing/Procedures:  None ordered.   Follow-Up: At [facility], you and your health needs are our priority.  As part of our continuing mission to provide you with exceptional heart care, we have created designated Provider Care Teams.  These Care Teams include your primary Cardiologist (physician) and Advanced Practice Providers (APPs -  Physician Assistants and Nurse Practitioners) who all work together to provide you with the care you need, when you need it.  We recommend signing up for the patient portal called "MyChart".  Sign up information is provided on this After Visit Summary.  MyChart is used to connect with patients for Virtual Visits (Telemedicine).  Patients are able to view lab/test results, encounter notes, upcoming appointments, etc.  Non-urgent messages can be sent to your provider as well.   To learn more about what you can do with MyChart, go to [email or web address].    Your next appointment:   6 month(s)  Provider:   Eligha BridegroomMichelle Ayman Brull, NP         Other  Instructions  Your physician wants you to follow-up in: 6 months with Lebron ConnersMichelle Janica Eldred,NP. You will receive a reminder letter in the mail two months in advance. If you don't receive a letter, please call our office to schedule the follow-up appointment.  HOW TO TAKE YOUR BLOOD PRESSURE  Rest 5 minutes before taking your blood pressure. Don't  smoke or drink caffeinated beverages for at least 30 minutes before. Take your blood pressure before (not after) you eat. Sit comfortably with your back supported and both feet on the floor ( don't cross your legs). Elevate your arm to heart level on a table or a desk. Use the proper sized cuff.  It should fit smoothly and snugly around your bare upper arm.  There should be  Enough room to slip a fingertip under the cuff.  The bottom  edge of the cuff should be 1 inch above the crease Of the elbow. Please monitor your blood pressure once daily 2 hours after your am medication. If you blood pressure Consistently remains above 140 (systolic) top number or over 80 ( diastolic) bottom number X 3 days  Consecutively.  Please call our office at [phone] or send Mychart message.     ----Avoid cold medicines with D or DM at the end of them----        Signed, Levi Aland, NP  05/15/2022 3:42 PM    [facility]

## 2022-05-14 NOTE — Telephone Encounter (Signed)
See other phone note from 05/13/22.   Left message for patient informing her Dr. Marlou Porch would like for her to be evaluated in our office with APP and to get an EKG.   Appointment scheduled for 05/15/22 at 2:45pm with Christen Bame, NP.   Provided office number for callback if any questions or need to reschedule.

## 2022-05-14 NOTE — Telephone Encounter (Signed)
Left message for patient informing her Dr. Marlou Porch would like for her to be evaluated in our office with APP and to get an EKG.  Appointment scheduled for 05/15/22 at 2:45pm with Christen Bame, NP.  Provided office number for callback if any questions or need to reschedule.

## 2022-05-14 NOTE — Telephone Encounter (Signed)
Pt is calling to follow up and stated they called the doctor on call last night. Per pt she was recommended to get an EKG. Please advise

## 2022-05-15 ENCOUNTER — Encounter: Payer: Self-pay | Admitting: Nurse Practitioner

## 2022-05-15 ENCOUNTER — Ambulatory Visit: Payer: Medicare HMO | Attending: Nurse Practitioner | Admitting: Nurse Practitioner

## 2022-05-15 VITALS — BP 126/76 | HR 76 | Ht 62.0 in | Wt 143.0 lb

## 2022-05-15 DIAGNOSIS — I340 Nonrheumatic mitral (valve) insufficiency: Secondary | ICD-10-CM | POA: Diagnosis not present

## 2022-05-15 DIAGNOSIS — E785 Hyperlipidemia, unspecified: Secondary | ICD-10-CM | POA: Diagnosis not present

## 2022-05-15 DIAGNOSIS — I10 Essential (primary) hypertension: Secondary | ICD-10-CM | POA: Diagnosis not present

## 2022-05-15 DIAGNOSIS — I6523 Occlusion and stenosis of bilateral carotid arteries: Secondary | ICD-10-CM | POA: Diagnosis not present

## 2022-05-15 DIAGNOSIS — I4510 Unspecified right bundle-branch block: Secondary | ICD-10-CM

## 2022-05-15 DIAGNOSIS — I441 Atrioventricular block, second degree: Secondary | ICD-10-CM | POA: Diagnosis not present

## 2022-05-15 DIAGNOSIS — R000 Tachycardia, unspecified: Secondary | ICD-10-CM | POA: Diagnosis not present

## 2022-05-15 DIAGNOSIS — I2510 Atherosclerotic heart disease of native coronary artery without angina pectoris: Secondary | ICD-10-CM

## 2022-05-15 DIAGNOSIS — R002 Palpitations: Secondary | ICD-10-CM | POA: Diagnosis not present

## 2022-05-15 DIAGNOSIS — R001 Bradycardia, unspecified: Secondary | ICD-10-CM | POA: Diagnosis not present

## 2022-05-15 MED ORDER — PROPRANOLOL HCL 10 MG PO TABS: 10.0000 mg | ORAL_TABLET | Freq: Three times a day (TID) | ORAL | 3 refills | Status: DC

## 2022-05-15 NOTE — Patient Instructions (Signed)
Medication Instructions:   START Propranolol one (1) tablet ( 10 mg ) by mouth as needed up to three times daily for palpitations.   *If you need a refill on your cardiac medications before your next appointment, please call your pharmacy*   Lab Work:  None ordered.  If you have labs (blood work) drawn today and your tests are completely normal, you will receive your results only by: MyChart Message (if you have MyChart) OR A paper copy in the mail If you have any lab test that is abnormal or we need to change your treatment, we will call you to review the results.   Testing/Procedures:  None ordered.   Follow-Up: At [facility], you and your health needs are our priority.  As part of our continuing mission to provide you with exceptional heart care, we have created designated Provider Care Teams.  These Care Teams include your primary Cardiologist (physician) and Advanced Practice Providers (APPs -  Physician Assistants and Nurse Practitioners) who all work together to provide you with the care you need, when you need it.  We recommend signing up for the patient portal called "MyChart".  Sign up information is provided on this After Visit Summary.  MyChart is used to connect with patients for Virtual Visits (Telemedicine).  Patients are able to view lab/test results, encounter notes, upcoming appointments, etc.  Non-urgent messages can be sent to your provider as well.   To learn more about what you can do with MyChart, go to [email or web address].    Your next appointment:   6 month(s)  Provider:   Eligha Bridegroom, NP         Other Instructions  Your physician wants you to follow-up in: 6 months with Lebron Conners. You will receive a reminder letter in the mail two months in advance. If you don't receive a letter, please call our office to schedule the follow-up appointment.  HOW TO TAKE YOUR BLOOD PRESSURE  Rest 5 minutes before taking your blood  pressure. Don't  smoke or drink caffeinated beverages for at least 30 minutes before. Take your blood pressure before (not after) you eat. Sit comfortably with your back supported and both feet on the floor ( don't cross your legs). Elevate your arm to heart level on a table or a desk. Use the proper sized cuff.  It should fit smoothly and snugly around your bare upper arm.  There should be  Enough room to slip a fingertip under the cuff.  The bottom edge of the cuff should be 1 inch above the crease Of the elbow. Please monitor your blood pressure once daily 2 hours after your am medication. If you blood pressure Consistently remains above 140 (systolic) top number or over 80 ( diastolic) bottom number X 3 days  Consecutively.  Please call our office at [phone] or send Mychart message.     ----Avoid cold medicines with D or DM at the end of them----

## 2022-06-01 DIAGNOSIS — M542 Cervicalgia: Secondary | ICD-10-CM | POA: Diagnosis not present

## 2022-06-15 ENCOUNTER — Other Ambulatory Visit: Payer: Self-pay

## 2022-06-15 ENCOUNTER — Telehealth: Payer: Self-pay | Admitting: Cardiology

## 2022-06-15 DIAGNOSIS — M542 Cervicalgia: Secondary | ICD-10-CM | POA: Diagnosis not present

## 2022-06-15 MED ORDER — EZETIMIBE 10 MG PO TABS: 10.0000 mg | ORAL_TABLET | Freq: Every day | ORAL | 3 refills | Status: DC

## 2022-06-15 MED ORDER — ROSUVASTATIN CALCIUM 5 MG PO TABS: 5.0000 mg | ORAL_TABLET | Freq: Every day | ORAL | 3 refills | Status: DC

## 2022-06-15 MED ORDER — AMLODIPINE BESYLATE 2.5 MG PO TABS: 2.5000 mg | ORAL_TABLET | Freq: Every day | ORAL | 3 refills | Status: DC

## 2022-06-15 MED ORDER — PROPRANOLOL HCL 10 MG PO TABS: 10.0000 mg | ORAL_TABLET | Freq: Three times a day (TID) | ORAL | 3 refills | Status: DC

## 2022-06-15 NOTE — Telephone Encounter (Signed)
Called pt to inform her that her medications were resent to [facility] mail order pharmacy as requested. I advised pt that if she has any other problems, questions or concerns, to give our office a call back. Pt verbalized understanding.

## 2022-06-15 NOTE — Telephone Encounter (Signed)
Pt c/o medication issue:  1. Name of Medication:  ezetimibe (ZETIA) 10 MG tablet  amLODipine (NORVASC) 2.5 MG tablet rosuvastatin (CRESTOR) 5 MG tablet  2. How are you currently taking this medication (dosage and times per day)?   3. Are you having a reaction (difficulty breathing--STAT)? No   4. What is your medication issue? Prescriptions [facility] instead of [facility] as requested on refill request. The number for correct pharmacy is [phone]. Please advise.

## 2022-06-15 NOTE — Telephone Encounter (Signed)
*  STAT* If patient is at the pharmacy, call can be transferred to refill team.   1. Which medications need to be refilled? (please list name of each medication and dose if known)  ezetimibe (ZETIA) 10 MG tablet  amLODipine (NORVASC) 2.5 MG tablet rosuvastatin (CRESTOR) 5 MG tablet  2. Which pharmacy/location (including street and city if local pharmacy) is medication to be sent to? [facility]  [phone]  3. Do they need a 30 day or 90 day supply? 90 Day Supply

## 2022-06-15 NOTE — Telephone Encounter (Signed)
Pt's medication was sent to pt's pharmacy as requested. Confirmation received.  °

## 2022-06-22 DIAGNOSIS — Z1231 Encounter for screening mammogram for malignant neoplasm of breast: Secondary | ICD-10-CM | POA: Diagnosis not present

## 2022-06-23 ENCOUNTER — Other Ambulatory Visit: Payer: Self-pay | Admitting: Cardiology

## 2022-06-28 DIAGNOSIS — L089 Local infection of the skin and subcutaneous tissue, unspecified: Secondary | ICD-10-CM | POA: Diagnosis not present

## 2022-06-28 DIAGNOSIS — L02414 Cutaneous abscess of left upper limb: Secondary | ICD-10-CM | POA: Diagnosis not present

## 2022-06-29 DIAGNOSIS — L089 Local infection of the skin and subcutaneous tissue, unspecified: Secondary | ICD-10-CM | POA: Diagnosis not present

## 2022-06-29 DIAGNOSIS — N6089 Other benign mammary dysplasias of unspecified breast: Secondary | ICD-10-CM | POA: Diagnosis not present

## 2022-06-29 DIAGNOSIS — R9230 Dense breasts, unspecified: Secondary | ICD-10-CM | POA: Diagnosis not present

## 2022-06-29 DIAGNOSIS — N6489 Other specified disorders of breast: Secondary | ICD-10-CM | POA: Diagnosis not present

## 2022-06-29 DIAGNOSIS — R922 Inconclusive mammogram: Secondary | ICD-10-CM | POA: Diagnosis not present

## 2022-06-30 DIAGNOSIS — I4510 Unspecified right bundle-branch block: Secondary | ICD-10-CM | POA: Diagnosis not present

## 2022-06-30 DIAGNOSIS — F419 Anxiety disorder, unspecified: Secondary | ICD-10-CM | POA: Diagnosis not present

## 2022-06-30 DIAGNOSIS — L539 Erythematous condition, unspecified: Secondary | ICD-10-CM | POA: Diagnosis not present

## 2022-06-30 DIAGNOSIS — A419 Sepsis, unspecified organism: Secondary | ICD-10-CM | POA: Diagnosis not present

## 2022-06-30 DIAGNOSIS — M5030 Other cervical disc degeneration, unspecified cervical region: Secondary | ICD-10-CM | POA: Diagnosis not present

## 2022-06-30 DIAGNOSIS — M71122 Other infective bursitis, left elbow: Secondary | ICD-10-CM | POA: Diagnosis not present

## 2022-06-30 DIAGNOSIS — E039 Hypothyroidism, unspecified: Secondary | ICD-10-CM | POA: Diagnosis not present

## 2022-06-30 DIAGNOSIS — M7989 Other specified soft tissue disorders: Secondary | ICD-10-CM | POA: Diagnosis not present

## 2022-06-30 DIAGNOSIS — I517 Cardiomegaly: Secondary | ICD-10-CM | POA: Diagnosis not present

## 2022-06-30 DIAGNOSIS — I10 Essential (primary) hypertension: Secondary | ICD-10-CM | POA: Diagnosis not present

## 2022-06-30 DIAGNOSIS — M25522 Pain in left elbow: Secondary | ICD-10-CM | POA: Diagnosis not present

## 2022-06-30 DIAGNOSIS — E785 Hyperlipidemia, unspecified: Secondary | ICD-10-CM | POA: Diagnosis not present

## 2022-07-01 DIAGNOSIS — M71122 Other infective bursitis, left elbow: Secondary | ICD-10-CM | POA: Diagnosis not present

## 2022-07-01 DIAGNOSIS — M7989 Other specified soft tissue disorders: Secondary | ICD-10-CM | POA: Diagnosis not present

## 2022-07-01 DIAGNOSIS — E782 Mixed hyperlipidemia: Secondary | ICD-10-CM | POA: Diagnosis not present

## 2022-07-01 DIAGNOSIS — Z79899 Other long term (current) drug therapy: Secondary | ICD-10-CM | POA: Diagnosis not present

## 2022-07-01 DIAGNOSIS — M5030 Other cervical disc degeneration, unspecified cervical region: Secondary | ICD-10-CM | POA: Diagnosis not present

## 2022-07-01 DIAGNOSIS — Z79890 Hormone replacement therapy (postmenopausal): Secondary | ICD-10-CM | POA: Diagnosis not present

## 2022-07-01 DIAGNOSIS — L539 Erythematous condition, unspecified: Secondary | ICD-10-CM | POA: Diagnosis not present

## 2022-07-01 DIAGNOSIS — E038 Other specified hypothyroidism: Secondary | ICD-10-CM | POA: Diagnosis not present

## 2022-07-01 DIAGNOSIS — Z882 Allergy status to sulfonamides status: Secondary | ICD-10-CM | POA: Diagnosis not present

## 2022-07-01 DIAGNOSIS — M7022 Olecranon bursitis, left elbow: Secondary | ICD-10-CM | POA: Diagnosis not present

## 2022-07-01 DIAGNOSIS — M810 Age-related osteoporosis without current pathological fracture: Secondary | ICD-10-CM | POA: Diagnosis not present

## 2022-07-01 DIAGNOSIS — A419 Sepsis, unspecified organism: Secondary | ICD-10-CM | POA: Diagnosis not present

## 2022-07-01 DIAGNOSIS — M25522 Pain in left elbow: Secondary | ICD-10-CM | POA: Diagnosis not present

## 2022-07-01 DIAGNOSIS — I10 Essential (primary) hypertension: Secondary | ICD-10-CM | POA: Diagnosis not present

## 2022-07-01 DIAGNOSIS — E039 Hypothyroidism, unspecified: Secondary | ICD-10-CM | POA: Diagnosis not present

## 2022-07-01 DIAGNOSIS — L02414 Cutaneous abscess of left upper limb: Secondary | ICD-10-CM | POA: Diagnosis not present

## 2022-07-01 DIAGNOSIS — M71022 Abscess of bursa, left elbow: Secondary | ICD-10-CM | POA: Diagnosis not present

## 2022-07-01 DIAGNOSIS — F419 Anxiety disorder, unspecified: Secondary | ICD-10-CM | POA: Diagnosis not present

## 2022-07-01 DIAGNOSIS — Z881 Allergy status to other antibiotic agents status: Secondary | ICD-10-CM | POA: Diagnosis not present

## 2022-07-02 DIAGNOSIS — M71122 Other infective bursitis, left elbow: Secondary | ICD-10-CM | POA: Diagnosis not present

## 2022-07-02 DIAGNOSIS — F419 Anxiety disorder, unspecified: Secondary | ICD-10-CM | POA: Diagnosis not present

## 2022-07-02 DIAGNOSIS — M5030 Other cervical disc degeneration, unspecified cervical region: Secondary | ICD-10-CM | POA: Diagnosis not present

## 2022-07-02 DIAGNOSIS — E038 Other specified hypothyroidism: Secondary | ICD-10-CM | POA: Diagnosis not present

## 2022-07-02 DIAGNOSIS — E782 Mixed hyperlipidemia: Secondary | ICD-10-CM | POA: Diagnosis not present

## 2022-07-02 DIAGNOSIS — I10 Essential (primary) hypertension: Secondary | ICD-10-CM | POA: Diagnosis not present

## 2022-07-05 ENCOUNTER — Other Ambulatory Visit: Payer: Self-pay | Admitting: Cardiology

## 2022-07-07 ENCOUNTER — Other Ambulatory Visit: Payer: Self-pay | Admitting: Cardiology

## 2022-07-07 DIAGNOSIS — M71129 Other infective bursitis, unspecified elbow: Secondary | ICD-10-CM | POA: Diagnosis not present

## 2022-07-08 DIAGNOSIS — M542 Cervicalgia: Secondary | ICD-10-CM | POA: Diagnosis not present

## 2022-07-16 DIAGNOSIS — E039 Hypothyroidism, unspecified: Secondary | ICD-10-CM | POA: Diagnosis not present

## 2022-07-16 DIAGNOSIS — E782 Mixed hyperlipidemia: Secondary | ICD-10-CM | POA: Diagnosis not present

## 2022-07-16 DIAGNOSIS — R7303 Prediabetes: Secondary | ICD-10-CM | POA: Diagnosis not present

## 2022-07-16 DIAGNOSIS — I119 Hypertensive heart disease without heart failure: Secondary | ICD-10-CM | POA: Diagnosis not present

## 2022-07-16 DIAGNOSIS — F411 Generalized anxiety disorder: Secondary | ICD-10-CM | POA: Diagnosis not present

## 2022-07-16 DIAGNOSIS — M71129 Other infective bursitis, unspecified elbow: Secondary | ICD-10-CM | POA: Diagnosis not present

## 2022-07-16 DIAGNOSIS — Z0000 Encounter for general adult medical examination without abnormal findings: Secondary | ICD-10-CM | POA: Diagnosis not present

## 2022-07-16 DIAGNOSIS — I6529 Occlusion and stenosis of unspecified carotid artery: Secondary | ICD-10-CM | POA: Diagnosis not present

## 2022-07-16 DIAGNOSIS — G43909 Migraine, unspecified, not intractable, without status migrainosus: Secondary | ICD-10-CM | POA: Diagnosis not present

## 2022-07-16 DIAGNOSIS — I201 Angina pectoris with documented spasm: Secondary | ICD-10-CM | POA: Diagnosis not present

## 2022-07-16 DIAGNOSIS — D692 Other nonthrombocytopenic purpura: Secondary | ICD-10-CM | POA: Diagnosis not present

## 2022-07-16 DIAGNOSIS — I700 Atherosclerosis of aorta: Secondary | ICD-10-CM | POA: Diagnosis not present

## 2022-07-16 LAB — LAB REPORT - SCANNED
A1c: 6.1
EGFR: 99

## 2022-07-21 DIAGNOSIS — G44219 Episodic tension-type headache, not intractable: Secondary | ICD-10-CM | POA: Diagnosis not present

## 2022-07-21 DIAGNOSIS — J301 Allergic rhinitis due to pollen: Secondary | ICD-10-CM | POA: Diagnosis not present

## 2022-07-24 ENCOUNTER — Telehealth: Payer: Self-pay | Admitting: Cardiology

## 2022-07-24 NOTE — Telephone Encounter (Signed)
Patient returned call to office and reviewed below information from pharmD on use of rizatriptan. All questions answered.

## 2022-07-24 NOTE — Telephone Encounter (Signed)
Vanessa Stewart, [facility]  You1 hour ago (10:58 AM)   The prescribing information for rizatriptan tablets and orally disintegrating tablets states that because propranolol increases rizatriptan concentrations, rizatriptan dose reductions are required when these agents are combined.  Sumatriptan which does not have same interaction however all triptan have risk of Qtc prolongation subsequent ventricular arrhythmias, including torsades de pointes (TdP) and ventricular fibrillation.   Per OV note on 05/15/22: Palpitations/Tachycardia: Recent episode of HR 130s. This was not sustained.  No presyncope, syncope. Had been ill with cold symptoms, had right knee pain and received a steroid injection in her knee, all of which could have contributed to elevated HR. Wears Apple watch - thinks this contributes to anxiety about HR. Has sister's funeral coming up this weekend. Will give her propranolol 10 mg to take as needed for palpitations/tachycardia.  Returned call to patient to review above pharmD's guidelines on use with propranolol. No answer, left message for her to call back. Only concern with her medications revolves around Propranolol which is an as needed treatment.

## 2022-07-24 NOTE — Telephone Encounter (Signed)
Pt c/o medication issue:  1. Name of Medication: Rizatriptan 10 mg   2. How are you currently taking this medication (dosage and times per day)?   3. Are you having a reaction (difficulty breathing--STAT)? No  4. What is your medication issue? Pt would like a callback to see if it's okay for her to take this medication. She was prescribed this for migraines by her neurologist. Pt is requesting a callback. Please advise

## 2022-07-27 DIAGNOSIS — M542 Cervicalgia: Secondary | ICD-10-CM | POA: Diagnosis not present

## 2022-07-28 DIAGNOSIS — S83231D Complex tear of medial meniscus, current injury, right knee, subsequent encounter: Secondary | ICD-10-CM | POA: Diagnosis not present

## 2022-08-03 ENCOUNTER — Ambulatory Visit: Payer: Medicare HMO | Admitting: Surgery

## 2022-08-03 ENCOUNTER — Encounter (HOSPITAL_COMMUNITY): Payer: Medicare HMO

## 2022-08-17 DIAGNOSIS — R300 Dysuria: Secondary | ICD-10-CM | POA: Diagnosis not present

## 2022-08-17 DIAGNOSIS — N3000 Acute cystitis without hematuria: Secondary | ICD-10-CM | POA: Diagnosis not present

## 2022-09-01 DIAGNOSIS — M75121 Complete rotator cuff tear or rupture of right shoulder, not specified as traumatic: Secondary | ICD-10-CM | POA: Diagnosis not present

## 2022-09-09 DIAGNOSIS — M542 Cervicalgia: Secondary | ICD-10-CM | POA: Diagnosis not present

## 2022-09-24 DIAGNOSIS — M50322 Other cervical disc degeneration at C5-C6 level: Secondary | ICD-10-CM | POA: Diagnosis not present

## 2022-09-24 DIAGNOSIS — M5384 Other specified dorsopathies, thoracic region: Secondary | ICD-10-CM | POA: Diagnosis not present

## 2022-09-24 DIAGNOSIS — M9902 Segmental and somatic dysfunction of thoracic region: Secondary | ICD-10-CM | POA: Diagnosis not present

## 2022-09-24 DIAGNOSIS — M9901 Segmental and somatic dysfunction of cervical region: Secondary | ICD-10-CM | POA: Diagnosis not present

## 2022-09-28 DIAGNOSIS — M9902 Segmental and somatic dysfunction of thoracic region: Secondary | ICD-10-CM | POA: Diagnosis not present

## 2022-09-28 DIAGNOSIS — M5384 Other specified dorsopathies, thoracic region: Secondary | ICD-10-CM | POA: Diagnosis not present

## 2022-09-28 DIAGNOSIS — M50322 Other cervical disc degeneration at C5-C6 level: Secondary | ICD-10-CM | POA: Diagnosis not present

## 2022-09-28 DIAGNOSIS — M9901 Segmental and somatic dysfunction of cervical region: Secondary | ICD-10-CM | POA: Diagnosis not present

## 2022-09-29 DIAGNOSIS — H353113 Nonexudative age-related macular degeneration, right eye, advanced atrophic without subfoveal involvement: Secondary | ICD-10-CM | POA: Diagnosis not present

## 2022-09-29 DIAGNOSIS — H43393 Other vitreous opacities, bilateral: Secondary | ICD-10-CM | POA: Diagnosis not present

## 2022-09-29 DIAGNOSIS — H04123 Dry eye syndrome of bilateral lacrimal glands: Secondary | ICD-10-CM | POA: Diagnosis not present

## 2022-10-14 DIAGNOSIS — M25511 Pain in right shoulder: Secondary | ICD-10-CM | POA: Diagnosis not present

## 2022-10-14 DIAGNOSIS — M25561 Pain in right knee: Secondary | ICD-10-CM | POA: Diagnosis not present

## 2022-10-15 DIAGNOSIS — M50322 Other cervical disc degeneration at C5-C6 level: Secondary | ICD-10-CM | POA: Diagnosis not present

## 2022-10-15 DIAGNOSIS — M5384 Other specified dorsopathies, thoracic region: Secondary | ICD-10-CM | POA: Diagnosis not present

## 2022-10-15 DIAGNOSIS — M9901 Segmental and somatic dysfunction of cervical region: Secondary | ICD-10-CM | POA: Diagnosis not present

## 2022-10-15 DIAGNOSIS — M9902 Segmental and somatic dysfunction of thoracic region: Secondary | ICD-10-CM | POA: Diagnosis not present

## 2022-10-19 DIAGNOSIS — M9902 Segmental and somatic dysfunction of thoracic region: Secondary | ICD-10-CM | POA: Diagnosis not present

## 2022-10-19 DIAGNOSIS — M50322 Other cervical disc degeneration at C5-C6 level: Secondary | ICD-10-CM | POA: Diagnosis not present

## 2022-10-19 DIAGNOSIS — M9901 Segmental and somatic dysfunction of cervical region: Secondary | ICD-10-CM | POA: Diagnosis not present

## 2022-10-19 DIAGNOSIS — M5384 Other specified dorsopathies, thoracic region: Secondary | ICD-10-CM | POA: Diagnosis not present

## 2022-10-20 DIAGNOSIS — M9902 Segmental and somatic dysfunction of thoracic region: Secondary | ICD-10-CM | POA: Diagnosis not present

## 2022-10-20 DIAGNOSIS — M50322 Other cervical disc degeneration at C5-C6 level: Secondary | ICD-10-CM | POA: Diagnosis not present

## 2022-10-20 DIAGNOSIS — M5384 Other specified dorsopathies, thoracic region: Secondary | ICD-10-CM | POA: Diagnosis not present

## 2022-10-20 DIAGNOSIS — M9901 Segmental and somatic dysfunction of cervical region: Secondary | ICD-10-CM | POA: Diagnosis not present

## 2022-10-21 DIAGNOSIS — M9901 Segmental and somatic dysfunction of cervical region: Secondary | ICD-10-CM | POA: Diagnosis not present

## 2022-10-21 DIAGNOSIS — M5384 Other specified dorsopathies, thoracic region: Secondary | ICD-10-CM | POA: Diagnosis not present

## 2022-10-21 DIAGNOSIS — M9902 Segmental and somatic dysfunction of thoracic region: Secondary | ICD-10-CM | POA: Diagnosis not present

## 2022-10-21 DIAGNOSIS — M50322 Other cervical disc degeneration at C5-C6 level: Secondary | ICD-10-CM | POA: Diagnosis not present

## 2022-11-09 DIAGNOSIS — M5384 Other specified dorsopathies, thoracic region: Secondary | ICD-10-CM | POA: Diagnosis not present

## 2022-11-09 DIAGNOSIS — M50322 Other cervical disc degeneration at C5-C6 level: Secondary | ICD-10-CM | POA: Diagnosis not present

## 2022-11-09 DIAGNOSIS — M9902 Segmental and somatic dysfunction of thoracic region: Secondary | ICD-10-CM | POA: Diagnosis not present

## 2022-11-09 DIAGNOSIS — M9901 Segmental and somatic dysfunction of cervical region: Secondary | ICD-10-CM | POA: Diagnosis not present

## 2022-11-10 DIAGNOSIS — M50322 Other cervical disc degeneration at C5-C6 level: Secondary | ICD-10-CM | POA: Diagnosis not present

## 2022-11-10 DIAGNOSIS — M5384 Other specified dorsopathies, thoracic region: Secondary | ICD-10-CM | POA: Diagnosis not present

## 2022-11-10 DIAGNOSIS — M9902 Segmental and somatic dysfunction of thoracic region: Secondary | ICD-10-CM | POA: Diagnosis not present

## 2022-11-10 DIAGNOSIS — M9901 Segmental and somatic dysfunction of cervical region: Secondary | ICD-10-CM | POA: Diagnosis not present

## 2022-11-11 DIAGNOSIS — Z23 Encounter for immunization: Secondary | ICD-10-CM | POA: Diagnosis not present

## 2022-11-12 DIAGNOSIS — M9902 Segmental and somatic dysfunction of thoracic region: Secondary | ICD-10-CM | POA: Diagnosis not present

## 2022-11-12 DIAGNOSIS — M50322 Other cervical disc degeneration at C5-C6 level: Secondary | ICD-10-CM | POA: Diagnosis not present

## 2022-11-12 DIAGNOSIS — M5384 Other specified dorsopathies, thoracic region: Secondary | ICD-10-CM | POA: Diagnosis not present

## 2022-11-12 DIAGNOSIS — M9901 Segmental and somatic dysfunction of cervical region: Secondary | ICD-10-CM | POA: Diagnosis not present

## 2022-11-13 DIAGNOSIS — R58 Hemorrhage, not elsewhere classified: Secondary | ICD-10-CM | POA: Diagnosis not present

## 2022-11-17 DIAGNOSIS — M50322 Other cervical disc degeneration at C5-C6 level: Secondary | ICD-10-CM | POA: Diagnosis not present

## 2022-11-17 DIAGNOSIS — M5384 Other specified dorsopathies, thoracic region: Secondary | ICD-10-CM | POA: Diagnosis not present

## 2022-11-17 DIAGNOSIS — M9902 Segmental and somatic dysfunction of thoracic region: Secondary | ICD-10-CM | POA: Diagnosis not present

## 2022-11-17 DIAGNOSIS — M9901 Segmental and somatic dysfunction of cervical region: Secondary | ICD-10-CM | POA: Diagnosis not present

## 2022-11-18 DIAGNOSIS — M5384 Other specified dorsopathies, thoracic region: Secondary | ICD-10-CM | POA: Diagnosis not present

## 2022-11-18 DIAGNOSIS — M9901 Segmental and somatic dysfunction of cervical region: Secondary | ICD-10-CM | POA: Diagnosis not present

## 2022-11-18 DIAGNOSIS — M9902 Segmental and somatic dysfunction of thoracic region: Secondary | ICD-10-CM | POA: Diagnosis not present

## 2022-11-18 DIAGNOSIS — M50322 Other cervical disc degeneration at C5-C6 level: Secondary | ICD-10-CM | POA: Diagnosis not present

## 2022-11-23 DIAGNOSIS — M50322 Other cervical disc degeneration at C5-C6 level: Secondary | ICD-10-CM | POA: Diagnosis not present

## 2022-11-23 DIAGNOSIS — M9901 Segmental and somatic dysfunction of cervical region: Secondary | ICD-10-CM | POA: Diagnosis not present

## 2022-11-23 DIAGNOSIS — M5384 Other specified dorsopathies, thoracic region: Secondary | ICD-10-CM | POA: Diagnosis not present

## 2022-11-23 DIAGNOSIS — M9902 Segmental and somatic dysfunction of thoracic region: Secondary | ICD-10-CM | POA: Diagnosis not present

## 2022-12-03 DIAGNOSIS — R399 Unspecified symptoms and signs involving the genitourinary system: Secondary | ICD-10-CM | POA: Diagnosis not present

## 2022-12-03 DIAGNOSIS — D692 Other nonthrombocytopenic purpura: Secondary | ICD-10-CM | POA: Diagnosis not present

## 2022-12-10 NOTE — Progress Notes (Signed)
Cardiology Office Note:  .   Date:  12/14/2022  ID:  Vanessa Stewart, DOB [birth year], MRN [id number] PCP: Lupita Raider, MD  [facility] Providers Cardiologist:  Donato Schultz, MD    Patient Profile: .      PMH Coronary artery disease LHC 07/26/2020 Moderate two-vessel disease with nonphysiologically significant lesions Mid LAD 60 to 65% stenosis Focal ostial 60% lesion in the LCx just after first marginal Carotid artery stenosis Carotid duplex 05/04/2022 RICA 1-39% stenosis LICA 40-59% stenosis  Followed by [facility] Hypertension Right bundle branch block Prinzmetal's angina Hyperlipidemia Palpitations  She established care with cardiology prior to 2015 and requested a provider switch to Dr. Anne Fu in February 2022.  She had a low risk Myoview in 2019.  She underwent cardiac catheterization 07/2020 following abnormal coronary CTA.  She was noted to have circumflex and LAD moderate nonflow limiting disease on cath assessment with medical management recommended.  History of hyperlipidemia.  In April 2023 LDL not at goal on rosuvastatin 20 mg daily and Zetia 10 mg daily was added.  She called our office 07/21/2021 to report pain in her hands that was attributed to rosuvastatin.  She was seen by Margaretmary Dys, RPH on 09/16/2021 at which time she was taking Zetia 10 mg daily.  She reported muscle aches in her thighs.  Ezetimibe was stopped and she was advised to start rosuvastatin 5 mg daily once she was feeling improvement in symptoms.  Hospitalization 08/2021 for Mobitz type I AV heart block and hypertension.  She noted HR in the 40s on her Apple Watch.  Was found to be in second-degree HB type I and was asymptomatic.  Potassium was 3.0 and she received potassium supplementation in the hospital.  Cardiac monitor revealed high degree AV block and she was referred to Dr. Graciela Husbands for pacemaker evaluation.  Diltiazem was discontinued.  Seen by Dr. Graciela Husbands 09/08/2021.  Monitor showed consecutively  dropped P waves but all bradycardia was at night.  He noted her to have sleep disordered breathing and daytime somnolence.  Seen in clinic for follow-up and reported tolerance of rosuvastatin 5 mg daily.  She was advised to resume Zetia at 5 mg daily. Labs completed by PCP 12/30/2021 revealed LDL 73, triglycerides 93.  She was seen in clinic 05/15/2022 by me for concern about episode of HR in the 130s.  At that time she had been ill with cold symptoms, had right knee pain, and had received a steroid injection in her knee.  She admitted to anxiety associated with checking her HR on her Apple Watch. She was advised to continue to monitor and to monitor for BP goal 130/80. 6 month f/u was recommended.     History of Present Illness: Vanessa Stewart   Vanessa Stewart is a very pleasant 77 y.o. female who is here today for 6 month follow-up. She inquires about starting Singulair for her allergies, which are currently managed with Flonase and an oral rinse. She reports no chest pain or shortness of breath, and her heart rate is within normal limits. She has been experiencing joint pain, particularly in her knee, which has limited her mobility and exercise. She is considering knee replacement surgery. She also reports some difficulty with her shoulder and is considering a shoulder replacement as well. She has been managing her diet, trying to follow a Mediterranean diet, but admits to eating out more than she should. She has a family history of heart disease.  She denies chest pain, shortness of  breath, orthopnea, PND, edema, presyncope, syncope. No specific cardiac concerns.   Discussed the use of AI scribe software for clinical note transcription with the patient, who gave verbal consent to proceed.   ROS: See HPI       Studies Reviewed: .        Risk Assessment/Calculations:             Physical Exam:   VS:  BP 128/78   Pulse 78   Ht 5\' 2"  (1.575 m)   Wt 142 lb 9.6 oz (64.7 kg)   SpO2 98%   BMI 26.08 kg/m     Wt Readings from Last 3 Encounters:  12/14/22 142 lb 9.6 oz (64.7 kg)  05/15/22 143 lb (64.9 kg)  05/04/22 143 lb (64.9 kg)    GEN: Well nourished, well developed in no acute distress NECK: No JVD; No carotid bruits CARDIAC: RRR, no murmurs, rubs, gallops RESPIRATORY:  Clear to auscultation without rales, wheezing or rhonchi  ABDOMEN: Soft, non-tender, non-distended EXTREMITIES:  No edema; No deformity     ASSESSMENT AND PLAN: .    Palpitations: Quiescent at this time. She has propranolol 10 mg to take as needed but this is rare. Continue to monitor.   CAD: Cardiac cath 07/2020 revealed moderate two-vessel disease with nonphysiologically significant lesions, recommendations for aggressive medical therapy. She denies chest pain, dyspnea, or other symptoms concerning for angina.  No indication for further ischemic evaluation at this time. Continue secondary prevention including heart healthy diet and regular physical activity. No bleeding concerns.  Continue aspirin, ezetimibe, Zestoretic, rosuvastatin.  Bradycardia/RBBB: Found to have 2-1 AVB in the setting of RBBB during overnight hospitalization.  ZIO monitor 08/2021 revealed all bradycardia at night with history of sleep disordered breathing.  No daytime pauses after stopping diltiazem.She declinced sleep testing. She denies activity intolerance, presyncope, syncope or fatigue.   Knee Pain: Severe, limiting mobility and exercise. Considering knee replacement surgery. ADvised that depending on her activity level at the time clearance is requested, we may consider getting a stress test to evaluate for ischemia.   Mitral regurgitation: Mild to moderate MR on echo 08/06/2021.  I do not appreciate a significant murmur on exam.  She is asymptomatic.  We will continue to monitor clinically at this time.  Hypertension: BP is well controlled.   Carotid artery disease: Mild stenosis right ICA 1 to 39%, moderate stenosis left ICA 40 to 59% by carotid  duplex 04/2022. No acute concerns. Management per [facility] with plan to repeat carotid duplex in 1 year.  Hyperlipidemia LDL goal < 70: LDL 75, triglycerides 109, HDL 68 on 07/16/2022. Advised she is not quite at goal. She is scheduled for repeat lab work with PCP in December. Encouraged heart healthy diet like Mediterranean.  Aim for 150 minutes of moderate intensity exercise each week.  Continue rosuvastatin and ezetimibe.       Dispo: 6 months with Dr. Anne Fu or me  Signed, Eligha Bridegroom, NP-C

## 2022-12-14 ENCOUNTER — Ambulatory Visit: Payer: Medicare HMO | Attending: Nurse Practitioner | Admitting: Nurse Practitioner

## 2022-12-14 ENCOUNTER — Encounter: Payer: Self-pay | Admitting: Nurse Practitioner

## 2022-12-14 VITALS — BP 128/78 | HR 78 | Ht 62.0 in | Wt 142.6 lb

## 2022-12-14 DIAGNOSIS — I6522 Occlusion and stenosis of left carotid artery: Secondary | ICD-10-CM | POA: Diagnosis not present

## 2022-12-14 DIAGNOSIS — R002 Palpitations: Secondary | ICD-10-CM | POA: Diagnosis not present

## 2022-12-14 DIAGNOSIS — I10 Essential (primary) hypertension: Secondary | ICD-10-CM

## 2022-12-14 DIAGNOSIS — M25561 Pain in right knee: Secondary | ICD-10-CM | POA: Diagnosis not present

## 2022-12-14 DIAGNOSIS — I059 Rheumatic mitral valve disease, unspecified: Secondary | ICD-10-CM | POA: Diagnosis not present

## 2022-12-14 DIAGNOSIS — I4510 Unspecified right bundle-branch block: Secondary | ICD-10-CM

## 2022-12-14 DIAGNOSIS — R001 Bradycardia, unspecified: Secondary | ICD-10-CM

## 2022-12-14 DIAGNOSIS — G8929 Other chronic pain: Secondary | ICD-10-CM | POA: Diagnosis not present

## 2022-12-14 DIAGNOSIS — I2510 Atherosclerotic heart disease of native coronary artery without angina pectoris: Secondary | ICD-10-CM | POA: Diagnosis not present

## 2022-12-14 DIAGNOSIS — E785 Hyperlipidemia, unspecified: Secondary | ICD-10-CM | POA: Diagnosis not present

## 2022-12-14 NOTE — Patient Instructions (Signed)
Medication Instructions:   Your physician recommends that you continue on your current medications as directed. Please refer to the Current Medication list given to you today.   *If you need a refill on your cardiac medications before your next appointment, please call your pharmacy*   Lab Work:  None ordered.  If you have labs (blood work) drawn today and your tests are completely normal, you will receive your results only by: MyChart Message (if you have MyChart) OR A paper copy in the mail If you have any lab test that is abnormal or we need to change your treatment, we will call you to review the results.   Testing/Procedures:  None ordered.   Follow-Up: At [facility], you and your health needs are our priority.  As part of our continuing mission to provide you with exceptional heart care, we have created designated Provider Care Teams.  These Care Teams include your primary Cardiologist (physician) and Advanced Practice Providers (APPs -  Physician Assistants and Nurse Practitioners) who all work together to provide you with the care you need, when you need it.  We recommend signing up for the patient portal called "MyChart".  Sign up information is provided on this After Visit Summary.  MyChart is used to connect with patients for Virtual Visits (Telemedicine).  Patients are able to view lab/test results, encounter notes, upcoming appointments, etc.  Non-urgent messages can be sent to your provider as well.   To learn more about what you can do with MyChart, go to [email or web address].    Your next appointment:   6 month(s)  Provider:   Donato Schultz, MD    Other Instructions  Your physician wants you to follow-up in: 6 months.  You will receive a reminder letter in the mail two months in advance. If you don't receive a letter, please call our office to schedule the follow-up appointment.

## 2022-12-16 DIAGNOSIS — S83231D Complex tear of medial meniscus, current injury, right knee, subsequent encounter: Secondary | ICD-10-CM | POA: Diagnosis not present

## 2022-12-16 DIAGNOSIS — M1711 Unilateral primary osteoarthritis, right knee: Secondary | ICD-10-CM | POA: Diagnosis not present

## 2022-12-23 DIAGNOSIS — M1711 Unilateral primary osteoarthritis, right knee: Secondary | ICD-10-CM | POA: Diagnosis not present

## 2022-12-23 DIAGNOSIS — M25511 Pain in right shoulder: Secondary | ICD-10-CM | POA: Diagnosis not present

## 2022-12-30 ENCOUNTER — Telehealth: Payer: Self-pay | Admitting: Cardiology

## 2022-12-30 DIAGNOSIS — M1711 Unilateral primary osteoarthritis, right knee: Secondary | ICD-10-CM | POA: Diagnosis not present

## 2022-12-30 NOTE — Telephone Encounter (Signed)
Spoke with Pt. She wanted to know if we knew what she took for pain last time she was having pain. I could not find in her chart and we decided it would be best to call the pharmacy and see if they can better help. Pt was thankful for call back and help.

## 2022-12-30 NOTE — Telephone Encounter (Signed)
Pt is going to be having surgery and wants to know what pain med is safe to take with her heart.

## 2022-12-31 ENCOUNTER — Telehealth: Payer: Self-pay | Admitting: Cardiology

## 2022-12-31 NOTE — Telephone Encounter (Signed)
Spoke with pt and told her that she is okay to take the hydrocodone tylenol 5-325 after her surgery. Pt verbalized understanding and had no further questions.

## 2022-12-31 NOTE — Telephone Encounter (Signed)
Pt c/o medication issue:  1. Name of Medication: Hydrocodone Tylenol 5-325  2. How are you currently taking this medication (dosage and times per day)? Npt currently taking   3. Are you having a reaction (difficulty breathing--STAT)? No   4. What is your medication issue? Patient states this is the medication prescribed for her surgery by her orthopedic surgeon. She is wanting to confirm this is okay for her to take from a heart standpoint. Please advise.

## 2023-01-04 DIAGNOSIS — M542 Cervicalgia: Secondary | ICD-10-CM | POA: Diagnosis not present

## 2023-01-11 LAB — LAB REPORT - SCANNED
A1c: 6.2
EGFR: 99

## 2023-01-13 DIAGNOSIS — M25811 Other specified joint disorders, right shoulder: Secondary | ICD-10-CM | POA: Diagnosis not present

## 2023-01-20 DIAGNOSIS — M542 Cervicalgia: Secondary | ICD-10-CM | POA: Diagnosis not present

## 2023-01-20 DIAGNOSIS — M25511 Pain in right shoulder: Secondary | ICD-10-CM | POA: Diagnosis not present

## 2023-01-20 DIAGNOSIS — M25561 Pain in right knee: Secondary | ICD-10-CM | POA: Diagnosis not present

## 2023-01-20 DIAGNOSIS — S46011D Strain of muscle(s) and tendon(s) of the rotator cuff of right shoulder, subsequent encounter: Secondary | ICD-10-CM | POA: Diagnosis not present

## 2023-01-22 ENCOUNTER — Telehealth: Payer: Self-pay

## 2023-01-22 NOTE — Telephone Encounter (Signed)
   Pre-operative Risk Assessment    Patient Name: Vanessa Stewart  DOB: 01/15/1946 MRN: [id number]  Last ov: 12/14/22 Eligha Bridegroom, NP    Upcoming ov: Unknown      Request for Surgical Clearance    Procedure:   Right reverse total shoulder arthroplasty   Date of Surgery:  Clearance TBD                                 Surgeon:  Dr. Malon Kindle  Surgeon's Group or Practice Name:  [facility]  Phone number:  [phone] Fax number:  [phone]   Type of Clearance Requested:   - Medical  - Pharmacy:  Hold Aspirin Not indicated    Type of Anesthesia:   Choice    Additional requests/questions:    SignedMarvell Fuller   01/22/2023, 5:00 PM

## 2023-01-25 NOTE — Telephone Encounter (Signed)
   Primary Cardiologist: Donato Schultz, MD  Chart reviewed as part of pre-operative protocol coverage. She was seen in the office by me on 12/14/22 and was not having any concerning cardiac symptoms with exertion. Given past medical history and time since last visit, based on ACC/AHA guidelines, Vanessa Stewart would be at acceptable risk for the planned procedure without further cardiovascular testing.   If requested, per office protocol, she may hold aspirin for 5-7 days prior to procedure and should resume as soon as hemodynamically stable postoperatively.   Please call with questions.  Levi Aland, NP-C 01/25/2023, 2:59 PM [address], [address] Office [phone] Fax [phone]

## 2023-01-27 DIAGNOSIS — H60311 Diffuse otitis externa, right ear: Secondary | ICD-10-CM | POA: Diagnosis not present

## 2023-01-28 DIAGNOSIS — S46011D Strain of muscle(s) and tendon(s) of the rotator cuff of right shoulder, subsequent encounter: Secondary | ICD-10-CM | POA: Diagnosis not present

## 2023-01-28 DIAGNOSIS — M542 Cervicalgia: Secondary | ICD-10-CM | POA: Diagnosis not present

## 2023-01-28 DIAGNOSIS — M25511 Pain in right shoulder: Secondary | ICD-10-CM | POA: Diagnosis not present

## 2023-02-24 DIAGNOSIS — D2372 Other benign neoplasm of skin of left lower limb, including hip: Secondary | ICD-10-CM | POA: Diagnosis not present

## 2023-02-24 DIAGNOSIS — D1801 Hemangioma of skin and subcutaneous tissue: Secondary | ICD-10-CM | POA: Diagnosis not present

## 2023-02-24 DIAGNOSIS — L821 Other seborrheic keratosis: Secondary | ICD-10-CM | POA: Diagnosis not present

## 2023-02-24 DIAGNOSIS — D2362 Other benign neoplasm of skin of left upper limb, including shoulder: Secondary | ICD-10-CM | POA: Diagnosis not present

## 2023-02-25 DIAGNOSIS — H43393 Other vitreous opacities, bilateral: Secondary | ICD-10-CM | POA: Diagnosis not present

## 2023-02-25 DIAGNOSIS — H353113 Nonexudative age-related macular degeneration, right eye, advanced atrophic without subfoveal involvement: Secondary | ICD-10-CM | POA: Diagnosis not present

## 2023-02-25 DIAGNOSIS — H04123 Dry eye syndrome of bilateral lacrimal glands: Secondary | ICD-10-CM | POA: Diagnosis not present

## 2023-03-01 DIAGNOSIS — M5032 Other cervical disc degeneration, mid-cervical region, unspecified level: Secondary | ICD-10-CM | POA: Diagnosis not present

## 2023-03-05 DIAGNOSIS — T148XXA Other injury of unspecified body region, initial encounter: Secondary | ICD-10-CM | POA: Diagnosis not present

## 2023-03-05 DIAGNOSIS — M542 Cervicalgia: Secondary | ICD-10-CM | POA: Diagnosis not present

## 2023-03-05 DIAGNOSIS — M25511 Pain in right shoulder: Secondary | ICD-10-CM | POA: Diagnosis not present

## 2023-03-05 DIAGNOSIS — S80811A Abrasion, right lower leg, initial encounter: Secondary | ICD-10-CM | POA: Diagnosis not present

## 2023-03-05 DIAGNOSIS — S46011D Strain of muscle(s) and tendon(s) of the rotator cuff of right shoulder, subsequent encounter: Secondary | ICD-10-CM | POA: Diagnosis not present

## 2023-03-15 DIAGNOSIS — S46011D Strain of muscle(s) and tendon(s) of the rotator cuff of right shoulder, subsequent encounter: Secondary | ICD-10-CM | POA: Diagnosis not present

## 2023-03-15 DIAGNOSIS — M25511 Pain in right shoulder: Secondary | ICD-10-CM | POA: Diagnosis not present

## 2023-03-15 DIAGNOSIS — M542 Cervicalgia: Secondary | ICD-10-CM | POA: Diagnosis not present

## 2023-03-16 NOTE — H&P (Cosign Needed)
Patient's anticipated LOS is less than 2 midnights, meeting these requirements: - Younger than 1 - Lives within 1 hour of care - Has a competent adult at home to recover with post-op recover - NO history of  - Chronic pain requiring opiods  - Diabetes  - Coronary Artery Disease  - Heart failure  - Heart attack  - Stroke  - DVT/VTE  - Cardiac arrhythmia  - Respiratory Failure/COPD  - Renal failure  - Anemia  - Advanced Liver disease     Vanessa Stewart is an 78 y.o. female.    Chief Complaint: right shoulder pain  HPI: Pt is a 78 y.o. female complaining of right shoulder pain for multiple years. Pain had continually increased since the beginning. X-rays in the clinic show end-stage arthritic changes of the right shoulder. Pt has tried various conservative treatments which have failed to alleviate their symptoms, including injections and therapy. Various options are discussed with the patient. Risks, benefits and expectations were discussed with the patient. Patient understand the risks, benefits and expectations and wishes to proceed with surgery.   PCP:  Lupita Raider, MD  D/C Plans: Home  PMH: Past Medical History:  Diagnosis Date   Allergy    Carotid artery stenosis 07/03/2015   1-39% bilateral by  dopplers 07/2015   Depression    Essential tremor    Hyperlipidemia    Hypertension    Hypothyroidism    Osteopenia    Prinzmetal's angina (HCC)    normal coronary arteries with coronary artery vasospasm at time of cath   RBBB    Chronic    PSH: Past Surgical History:  Procedure Laterality Date   CARDIAC CATHETERIZATION  12/09   coronary spasm noted on heart cath. No coronary artery disease. LV function normal   colonoscopy  07/07   ganem- 10 Years   CORONARY PRESSURE/FFR STUDY N/A 07/26/2020   Procedure: INTRAVASCULAR PRESSURE WIRE/FFR STUDY;  Surgeon: Marykay Lex, MD;  [facility] INVASIVE CV LAB;  Service: Cardiovascular;  Laterality: N/A;   KNEE  SURGERY     LEFT HEART CATH AND CORONARY ANGIOGRAPHY N/A 07/26/2020   Procedure: LEFT HEART CATH AND CORONARY ANGIOGRAPHY;  Surgeon: Marykay Lex, MD;  Location: [facility] INVASIVE CV LAB;  Service: Cardiovascular;  Laterality: N/A;   THYROIDECTOMY     TOTAL ABDOMINAL HYSTERECTOMY W/ BILATERAL SALPINGOOPHORECTOMY      Social History:  reports that she has never smoked. She has never used smokeless tobacco. She reports that she does not drink alcohol and does not use drugs. BMI: Estimated body mass index is 26.08 kg/m as calculated from the following:   Height as of 12/14/22: 5\' 2"  (1.575 m).   Weight as of 12/14/22: 64.7 kg.  Lab Results  Component Value Date   ALBUMIN 4.1 08/10/2021   Diabetes: Patient does not have a diagnosis of diabetes.     Smoking Status:   reports that she has never smoked. She has never used smokeless tobacco.    Allergies:  Allergies  Allergen Reactions   Elemental Sulfur Other (See Comments)    GI Issues   Fosamax [Alendronate] Other (See Comments)    Jaw pain    Lipitor [Atorvastatin] Other (See Comments)    Myalgias    Pravachol [Pravastatin] Other (See Comments)    Myalgias    Septra [Sulfamethoxazole-Trimethoprim] Other (See Comments)    GI issues   Sulfa Antibiotics Nausea And Vomiting    GI issues   Zetia [Ezetimibe] Other (  See Comments)    Myalgias   Crestor [Rosuvastatin] Swelling and Other (See Comments)    Myalgias     Medications: No current facility-administered medications for this encounter.   Current Outpatient Medications  Medication Sig Dispense Refill   ALPRAZolam (XANAX) 0.5 MG tablet Take 0.5 mg by mouth 2 (two) times daily as needed. (anxiety)     amLODipine (NORVASC) 2.5 MG tablet Take 1 tablet (2.5 mg total) by mouth daily. 90 tablet 3   aspirin 81 MG tablet Take 81 mg by mouth daily.     Calcium Carbonate-Vitamin D (CALCIUM-VITAMIN D3 PO) Take 1 tablet by mouth daily.     cetirizine (ZYRTEC) 10 MG tablet Take 10  mg by mouth daily.     ezetimibe (ZETIA) 10 MG tablet Take 1 tablet (10 mg total) by mouth daily. 90 tablet 3   levothyroxine (SYNTHROID, LEVOTHROID) 50 MCG tablet Take 50 mcg by mouth daily before breakfast.     lisinopril-hydrochlorothiazide (PRINZIDE,ZESTORETIC) 20-25 MG per tablet Take 1 tablet by mouth daily.     Omega-3 Fatty Acids (FISH OIL PO) Take 1 capsule by mouth daily.     PARoxetine (PAXIL) 20 MG tablet Take 20 mg by mouth daily.     potassium chloride SA (K-DUR,KLOR-CON) 20 MEQ tablet Take 1 tablet (20 mEq total) by mouth 2 (two) times daily. Please make overdue appt with Dr. Mayford Knife before anymore refills. 1st attempt 60 tablet 0   propranolol (INDERAL) 10 MG tablet Take 1 tablet (10 mg total) by mouth 3 (three) times daily. 270 tablet 3   risedronate (ACTONEL) 35 MG tablet Take 35 mg by mouth every Friday.     rosuvastatin (CRESTOR) 5 MG tablet TAKE 1 TABLET DAILY. 90 tablet 3    No results found for this or any previous visit (from the past 48 hours). No results found.  ROS: Pain with rom of the right upper extremity  Physical Exam: Alert and oriented 78 y.o. female in no acute distress Cranial nerves 2-12 intact Cervical spine: full rom with no tenderness, nv intact distally Chest: active breath sounds bilaterally, no wheeze rhonchi or rales Heart: regular rate and rhythm, no murmur Abd: non tender non distended with active bowel sounds Hip is stable with rom  Right shoulder painful and weak rom Nv intact distally No rashes or edema distally  Assessment/Plan Assessment: right shoulder cuff arthropathy   Plan:  Patient will undergo a right reverse total shoulder by Dr. Ranell Patrick at [place] Risks benefits and expectations were discussed with the patient. Patient understand risks, benefits and expectations and wishes to proceed. Preoperative templating of the joint replacement has been completed, documented, and submitted to the Operating Room personnel in order to  optimize intra-operative equipment management.   Alphonsa Overall PA-C, MPAS [facility] is now [employer] [address], [address], [place], [place] [address] Phone: [phone] [email or web address] Facebook  [employer]

## 2023-03-25 ENCOUNTER — Telehealth: Payer: Self-pay | Admitting: Cardiology

## 2023-03-25 NOTE — Telephone Encounter (Signed)
Patient stated she has been on Tamiflu for the flu and has been having headaches.  Patient stated she wants to know what medication she can take for headaches besides Tylenol.

## 2023-03-25 NOTE — Telephone Encounter (Signed)
I have resent patients clearance to requesting office and called patient to make them aware that has been sent again patient voiced understanding

## 2023-03-25 NOTE — Telephone Encounter (Signed)
Spoke with pt who is c/o headache after being exposed to a friend DX with flu.  Advised to use Coricidin OTC, saline nasal spray/washes and Tylenol as directed.  If no improvement she should follow up with PCP.  She was appreciative of the call back and information.

## 2023-03-25 NOTE — Telephone Encounter (Signed)
Patient called to report her surgery has been scheduled for 04/09/23.  Patient called to follow-up on her pre-op clearance.

## 2023-03-30 NOTE — Patient Instructions (Addendum)
SURGICAL WAITING ROOM VISITATION Patients having surgery or a procedure may have no more than 2 support people in the waiting area - these visitors may rotate.    Children under the age of 77 must have an adult with them who is not the patient.  Due to an increase in RSV and influenza rates and associated hospitalizations, children ages 39 and under may not visit patients in [facility] hospitals.   If the patient needs to stay at the hospital during part of their recovery, the visitor guidelines for inpatient rooms apply. Pre-op nurse will coordinate an appropriate time for 1 support person to accompany patient in pre-op.  This support person may not rotate.    Please refer to the [facility] website for the visitor guidelines for Inpatients (after your surgery is over and you are in a regular room).       Your procedure is scheduled on: 04-09-23   Report to [facility] Main Entrance    Report to admitting at 8:00 AM   Call this number if you have problems the morning of surgery [phone]   Do not eat food :After Midnight.   After Midnight you may have the following liquids until 7:25 AM DAY OF SURGERY  Water Non-Citrus Juices (without pulp, NO RED-Apple, White grape, White cranberry) Black Coffee (NO MILK/CREAM OR CREAMERS, sugar ok)  Clear Tea (NO MILK/CREAM OR CREAMERS, sugar ok) regular and decaf                             Plain Jell-O (NO RED)                                           Fruit ices (not with fruit pulp, NO RED)                                     Popsicles (NO RED)                                                               Sports drinks like Gatorade (NO RED)                   The day of surgery:  Drink ONE (1) Pre-Surgery Clear Ensure by 7:25 AM the morning of surgery. Drink in one sitting. Do not sip.  This drink was given to you during your hospital  pre-op appointment visit. Nothing else to drink after completing the Pre-Surgery Clear  Ensure.          If you have questions, please contact your surgeon's office.   FOLLOW  ANY ADDITIONAL PRE OP INSTRUCTIONS YOU RECEIVED FROM YOUR SURGEON'S OFFICE!!!     Oral Hygiene is also important to reduce your risk of infection.                                    Remember - BRUSH YOUR TEETH THE MORNING OF SURGERY WITH YOUR REGULAR TOOTHPASTE  Do NOT smoke after Midnight   Take these medicines the morning of surgery with A SIP OF WATER:    Amlodipine   Zyrtec   Famotidine   Levothyroxine   Paroxetine   Okay to use eyedrops   Alprazolam if needed  Stop all vitamins and herbal supplements 7 days before surgery                              You may not have any metal on your body including hair pins, jewelry, and body piercing             Do not wear make-up, lotions, powders, perfumes, or deodorant  Do not wear nail polish including gel and S&S, artificial/acrylic nails, or any other type of covering on natural nails including finger and toenails. If you have artificial nails, gel coating, etc. that needs to be removed by a nail salon please have this removed prior to surgery or surgery may need to be canceled/ delayed if the surgeon/ anesthesia feels like they are unable to be safely monitored.   Do not shave  48 hours prior to surgery.    Do not bring valuables to the hospital. [facility] IS NOT RESPONSIBLE   FOR VALUABLES.   Contacts, dentures or bridgework may not be worn into surgery.  DO NOT BRING YOUR HOME MEDICATIONS TO THE HOSPITAL. PHARMACY WILL DISPENSE MEDICATIONS LISTED ON YOUR MEDICATION LIST TO YOU DURING YOUR ADMISSION IN THE HOSPITAL!    Patients discharged on the day of surgery will not be allowed to drive home.  Someone NEEDS to stay with you for the first 24 hours after anesthesia.              Please read over the following fact sheets you were given: IF YOU HAVE QUESTIONS ABOUT YOUR PRE-OP INSTRUCTIONS PLEASE CALL [phone] [facility]  If you received  a COVID test during your pre-op visit  it is requested that you wear a mask when out in public, stay away from anyone that may not be feeling well and notify your surgeon if you develop symptoms. If you test positive for Covid or have been in contact with anyone that has tested positive in the last 10 days please notify you surgeon.   [facility]- Preparing for Total Shoulder Arthroplasty    Before surgery, you can play an important role. Because skin is not sterile, your skin needs to be as free of germs as possible. You can reduce the number of germs on your skin by using the following products. Benzoyl Peroxide Gel Reduces the number of germs present on the skin Applied twice a day to shoulder area starting two days before surgery    ==================================================================  Please follow these instructions carefully:  BENZOYL PEROXIDE 5% GEL  Please do not use if you have an allergy to benzoyl peroxide.   If your skin becomes reddened/irritated stop using the benzoyl peroxide.  Starting two days before surgery, apply as follows: Apply benzoyl peroxide in the morning and at night. Apply after taking a shower. If you are not taking a shower clean entire shoulder front, back, and side along with the armpit with a clean wet washcloth.  Place a quarter-sized dollop on your shoulder and rub in thoroughly, making sure to cover the front, back, and side of your shoulder, along with the armpit.   2 days before ____ AM   ____ PM  1 day before ____ AM   ____ PM                         Do this twice a day for two days.  (Last application is the night before surgery, AFTER using the CHG soap as described below).  Do NOT apply benzoyl peroxide gel on the day of surgery.   Pre-operative 5 CHG Bath Instructions   You can play a key role in reducing the risk of infection after surgery. Your skin needs to be as free of germs as possible. You can reduce the  number of germs on your skin by washing with CHG (chlorhexidine gluconate) soap before surgery. CHG is an antiseptic soap that kills germs and continues to kill germs even after washing.   DO NOT use if you have an allergy to chlorhexidine/CHG or antibacterial soaps. If your skin becomes reddened or irritated, stop using the CHG and notify one of our RNs at [phone].   Please shower with the CHG soap starting 4 days before surgery using the following schedule:     Please keep in mind the following:  DO NOT shave, including legs and underarms, starting the day of your first shower.   You may shave your face at any point before/day of surgery.  Place clean sheets on your bed the day you start using CHG soap. Use a clean washcloth (not used since being washed) for each shower. DO NOT sleep with pets once you start using the CHG.   CHG Shower Instructions:  If you choose to wash your hair and private area, wash first with your normal shampoo/soap.  After you use shampoo/soap, rinse your hair and body thoroughly to remove shampoo/soap residue.  Turn the water OFF and apply about 3 tablespoons (45 ml) of CHG soap to a CLEAN washcloth.  Apply CHG soap ONLY FROM YOUR NECK DOWN TO YOUR TOES (washing for 3-5 minutes)  DO NOT use CHG soap on face, private areas, open wounds, or sores.  Pay special attention to the area where your surgery is being performed.  If you are having back surgery, having someone wash your back for you may be helpful. Wait 2 minutes after CHG soap is applied, then you may rinse off the CHG soap.  Pat dry with a clean towel  Put on clean clothes/pajamas   If you choose to wear lotion, please use ONLY the CHG-compatible lotions on the back of this paper.     Additional instructions for the day of surgery: DO NOT APPLY any lotions, deodorants, cologne, or perfumes.   Put on clean/comfortable clothes.  Brush your teeth.  Ask your nurse before applying any prescription  medications to the skin.      CHG Compatible Lotions   Aveeno Moisturizing lotion  Cetaphil Moisturizing Cream  Cetaphil Moisturizing Lotion  Clairol Herbal Essence Moisturizing Lotion, Dry Skin  Clairol Herbal Essence Moisturizing Lotion, Extra Dry Skin  Clairol Herbal Essence Moisturizing Lotion, Normal Skin  Curel Age Defying Therapeutic Moisturizing Lotion with Alpha Hydroxy  Curel Extreme Care Body Lotion  Curel Soothing Hands Moisturizing Hand Lotion  Curel Therapeutic Moisturizing Cream, Fragrance-Free  Curel Therapeutic Moisturizing Lotion, Fragrance-Free  Curel Therapeutic Moisturizing Lotion, Original Formula  Eucerin Daily Replenishing Lotion  Eucerin Dry Skin Therapy Plus Alpha Hydroxy Crme  Eucerin Dry Skin Therapy Plus Alpha Hydroxy Lotion  Eucerin Original Crme  Eucerin Original Lotion  Eucerin Plus Crme Eucerin Plus Lotion  Eucerin  TriLipid Replenishing Lotion  Keri Anti-Bacterial Hand Lotion  Keri Deep Conditioning Original Lotion Dry Skin Formula Softly Scented  Keri Deep Conditioning Original Lotion, Fragrance Free Sensitive Skin Formula  Keri Lotion Fast Absorbing Fragrance Free Sensitive Skin Formula  Keri Lotion Fast Absorbing Softly Scented Dry Skin Formula  Keri Original Lotion  Keri Skin Renewal Lotion Keri Silky Smooth Lotion  Keri Silky Smooth Sensitive Skin Lotion  Nivea Body Creamy Conditioning Oil  Nivea Body Extra Enriched Lotion  Nivea Body Original Lotion  Nivea Body Sheer Moisturizing Lotion Nivea Crme  Nivea Skin Firming Lotion  NutraDerm 30 Skin Lotion  NutraDerm Skin Lotion  NutraDerm Therapeutic Skin Cream  NutraDerm Therapeutic Skin Lotion  ProShield Protective Hand Cream  Provon moisturizing lotion   PATIENT SIGNATURE_________________________________  NURSE SIGNATURE__________________________________  ________________________________________________________________________      Rogelia Mire  An incentive  spirometer is a tool that can help keep your lungs clear and active. This tool measures how well you are filling your lungs with each breath. Taking long deep breaths may help reverse or decrease the chance of developing breathing (pulmonary) problems (especially infection) following: A long period of time when you are unable to move or be active. BEFORE THE PROCEDURE  If the spirometer includes an indicator to show your best effort, your nurse or respiratory therapist will set it to a desired goal. If possible, sit up straight or lean slightly forward. Try not to slouch. Hold the incentive spirometer in an upright position. INSTRUCTIONS FOR USE  Sit on the edge of your bed if possible, or sit up as far as you can in bed or on a chair. Hold the incentive spirometer in an upright position. Breathe out normally. Place the mouthpiece in your mouth and seal your lips tightly around it. Breathe in slowly and as deeply as possible, raising the piston or the ball toward the top of the column. Hold your breath for 3-5 seconds or for as long as possible. Allow the piston or ball to fall to the bottom of the column. Remove the mouthpiece from your mouth and breathe out normally. Rest for a few seconds and repeat Steps 1 through 7 at least 10 times every 1-2 hours when you are awake. Take your time and take a few normal breaths between deep breaths. The spirometer may include an indicator to show your best effort. Use the indicator as a goal to work toward during each repetition. After each set of 10 deep breaths, practice coughing to be sure your lungs are clear. If you have an incision (the cut made at the time of surgery), support your incision when coughing by placing a pillow or rolled up towels firmly against it. Once you are able to get out of bed, walk around indoors and cough well. You may stop using the incentive spirometer when instructed by your caregiver.  RISKS AND COMPLICATIONS Take your time  so you do not get dizzy or light-headed. If you are in pain, you may need to take or ask for pain medication before doing incentive spirometry. It is harder to take a deep breath if you are having pain. AFTER USE Rest and breathe slowly and easily. It can be helpful to keep track of a log of your progress. Your caregiver can provide you with a simple table to help with this. If you are using the spirometer at home, follow these instructions: SEEK MEDICAL CARE IF:  You are having difficultly using the spirometer. You have trouble using the  spirometer as often as instructed. Your pain medication is not giving enough relief while using the spirometer. You develop fever of 100.5 F (38.1 C) or higher. SEEK IMMEDIATE MEDICAL CARE IF:  You cough up bloody sputum that had not been present before. You develop fever of 102 F (38.9 C) or greater. You develop worsening pain at or near the incision site. MAKE SURE YOU:  Understand these instructions. Will watch your condition. Will get help right away if you are not doing well or get worse. Document Released: 06/08/2006 Document Revised: 04/20/2011 Document Reviewed: 08/09/2006 [facility] Patient Information 2014 [place], [place].   ________________________________________________________________________

## 2023-03-30 NOTE — Progress Notes (Signed)
COVID Vaccine Completed:  Date of COVID positive in last 90 days:  PCP - Lupita Raider, MD Cardiologist - Donato Schultz, MD  Cardiac clearance in Epic dated 01-25-23  Chest x-ray -  EKG - 05-15-22 Epic Stress Test - 02-24-17 Epic ECHO - 08-06-21 Epic Cardiac Cath - 07-26-20 Epic Pacemaker/ICD device last checked: Spinal Cord Stimulator: Long Term Monitor - 08-27-21 Epic Coronary CT - 07-18-20 Epic  Bowel Prep -   Sleep Study -  CPAP -   Fasting Blood Sugar -  Checks Blood Sugar _____ times a day  Last dose of GLP1 agonist-  N/A GLP1 instructions:  Hold 7 days before surgery    Last dose of SGLT-2 inhibitors-  N/A SGLT-2 instructions:  Hold 3 days before surgery    Blood Thinner Instructions:  Last dose:   Time: Aspirin Instructions:  ASA 81.  Hold x1 week  Last Dose:  Activity level:  Can go up a flight of stairs and perform activities of daily living without stopping and without symptoms of chest pain or shortness of breath.  Able to exercise without symptoms  Unable to go up a flight of stairs without symptoms of     Anesthesia review:  CAD, carotid artery stenosis, RBBB, 2nd degree AV block, HTN  Patient denies shortness of breath, fever, cough and chest pain at PAT appointment  Patient verbalized understanding of instructions that were given to them at the PAT appointment. Patient was also instructed that they will need to review over the PAT instructions again at home before surgery.

## 2023-03-31 ENCOUNTER — Encounter (HOSPITAL_COMMUNITY): Payer: Self-pay

## 2023-03-31 ENCOUNTER — Other Ambulatory Visit: Payer: Self-pay

## 2023-03-31 ENCOUNTER — Encounter (HOSPITAL_COMMUNITY)
Admission: RE | Admit: 2023-03-31 | Discharge: 2023-03-31 | Disposition: A | Discharge status: Home / Self Care | Payer: Medicare HMO | Source: Ambulatory Visit | Attending: Orthopedic Surgery

## 2023-03-31 VITALS — BP 121/79 | HR 75 | Temp 97.9°F | Resp 16 | Ht 62.0 in | Wt 141.2 lb

## 2023-03-31 DIAGNOSIS — Z01818 Encounter for other preprocedural examination: Secondary | ICD-10-CM

## 2023-03-31 DIAGNOSIS — Z01812 Encounter for preprocedural laboratory examination: Secondary | ICD-10-CM | POA: Diagnosis present

## 2023-03-31 DIAGNOSIS — M19011 Primary osteoarthritis, right shoulder: Secondary | ICD-10-CM | POA: Diagnosis not present

## 2023-03-31 DIAGNOSIS — M75101 Unspecified rotator cuff tear or rupture of right shoulder, not specified as traumatic: Secondary | ICD-10-CM | POA: Insufficient documentation

## 2023-03-31 DIAGNOSIS — I10 Essential (primary) hypertension: Secondary | ICD-10-CM | POA: Insufficient documentation

## 2023-03-31 DIAGNOSIS — I4510 Unspecified right bundle-branch block: Secondary | ICD-10-CM | POA: Diagnosis not present

## 2023-03-31 DIAGNOSIS — Z79899 Other long term (current) drug therapy: Secondary | ICD-10-CM | POA: Diagnosis not present

## 2023-03-31 HISTORY — DX: Headache, unspecified: R51.9

## 2023-03-31 HISTORY — DX: Atherosclerotic heart disease of native coronary artery without angina pectoris: I25.10

## 2023-03-31 HISTORY — DX: Unspecified osteoarthritis, unspecified site: M19.90

## 2023-03-31 HISTORY — DX: Other specified postprocedural states: Z98.890

## 2023-03-31 HISTORY — DX: Nausea with vomiting, unspecified: R11.2

## 2023-03-31 LAB — BASIC METABOLIC PANEL
Anion gap: 8 (ref 5–15)
BUN: 12 mg/dL (ref 8–23)
CO2: 27 mmol/L (ref 22–32)
Calcium: 8.7 mg/dL — ABNORMAL LOW (ref 8.9–10.3)
Chloride: 103 mmol/L (ref 98–111)
Creatinine, Ser: 0.39 mg/dL — ABNORMAL LOW (ref 0.44–1.00)
GFR, Estimated: >60 mL/min (ref >60)
Glucose, Bld: 110 mg/dL — ABNORMAL HIGH (ref 70–99)
Potassium: 3.4 mmol/L — ABNORMAL LOW (ref 3.5–5.1)
Sodium: 138 mmol/L (ref 135–145)

## 2023-03-31 LAB — CBC
HCT: 43 % (ref 36.0–46.0)
Hemoglobin: 13.9 g/dL (ref 12.0–15.0)
MCH: 30 pg (ref 26.0–34.0)
MCHC: 32.3 g/dL (ref 30.0–36.0)
MCV: 92.9 fL (ref 80.0–100.0)
Platelets: 247 10*3/uL (ref 150–400)
RBC: 4.63 MIL/uL (ref 3.87–5.11)
RDW: 13 % (ref 11.5–15.5)
WBC: 5.7 10*3/uL (ref 4.0–10.5)
nRBC: 0 % (ref 0.0–0.2)

## 2023-03-31 LAB — SURGICAL PCR SCREEN
MRSA, PCR: NEGATIVE
Staphylococcus aureus: NEGATIVE

## 2023-04-01 ENCOUNTER — Encounter (HOSPITAL_COMMUNITY): Admission: RE | Admit: 2023-04-01 | Payer: Medicare HMO | Source: Ambulatory Visit

## 2023-04-01 NOTE — Progress Notes (Signed)
Anesthesia Chart Review   Case: [id number] Date/Time: 04/09/23 1010   Procedure: REVERSE SHOULDER ARTHROPLASTY (Right: Shoulder) - interscalene block can we have the knee follow this case   Anesthesia type: Choice   Pre-op diagnosis: Right shoulder rotator cuff tear, osteoarthritis   Location: [facility] ROOM 06 / [facility]   Surgeons: Beverely Low, MD       DISCUSSION:78 y.o. never smoker with h/o PONV, HTN, RBBB, right shoulder OA scheduled for above procedure 04/09/2023 with Dr. Beverely Low.   Per cardiology preoperative evaluation, " Chart reviewed as part of pre-operative protocol coverage. She was seen in the office by me on 12/14/22 and was not having any concerning cardiac symptoms with exertion. Given past medical history and time since last visit, based on ACC/AHA guidelines, Vanessa Stewart would be at acceptable risk for the planned procedure without further cardiovascular testing.    If requested, per office protocol, she may hold aspirin for 5-7 days prior to procedure and should resume as soon as hemodynamically stable postoperatively."   VS: BP 121/79   Pulse 75   Temp 36.6 C (Oral)   Resp 16   Ht 5\' 2"  (1.575 m)   Wt 64 kg   SpO2 98%   BMI 25.83 kg/m   PROVIDERS: Lupita Raider, MD is PCP   Primary Cardiologist: Donato Schultz, MD LABS: Labs reviewed: Acceptable for surgery. (all labs ordered are listed, but only abnormal results are displayed)  Labs Reviewed  BASIC METABOLIC PANEL - Abnormal; Notable for the following components:      Result Value   Potassium 3.4 (*)    Glucose, Bld 110 (*)    Creatinine, Ser 0.39 (*)    Calcium 8.7 (*)    All other components within normal limits  SURGICAL PCR SCREEN  CBC     IMAGES: Vas US Carotid 05/04/2022 Summary:  Right Carotid: Velocities in the right ICA are consistent with a 1-39%  stenosis.   Left Carotid: Velocities in the left ICA are consistent with a 40-59%  stenosis.               Stenosis likely due to  hairpin tortuosity.    EKG:   CV: Echo 08/06/2021  1. Left ventricular ejection fraction, by estimation, is 60 to 65%. The  left ventricle has normal function. The left ventricle has no regional  wall motion abnormalities. The left ventricular internal cavity size was  mildly dilated. There is mild left  ventricular hypertrophy of the basal-septal segment. Left ventricular  diastolic parameters are consistent with Grade I diastolic dysfunction  (impaired relaxation).   2. Right ventricular systolic function is normal. The right ventricular  size is normal. There is normal pulmonary artery systolic pressure.   3. Left atrial size was moderately dilated.   4. The mitral valve is normal in structure. Mild to moderate mitral valve  regurgitation. No evidence of mitral stenosis.   5. The aortic valve is tricuspid. Aortic valve regurgitation is not  visualized. Aortic valve sclerosis/calcification is present, without any  evidence of aortic stenosis.   6. The inferior vena cava is normal in size with greater than 50%  respiratory variability, suggesting right atrial pressure of 3 mmHg.  Past Medical History:  Diagnosis Date   Allergy    Arthritis    Carotid artery stenosis 07/03/2015   1-39% bilateral by  dopplers 07/2015   Coronary artery disease    Depression    Essential tremor    Headache  Hyperlipidemia    Hypertension    Hypothyroidism    Osteopenia    PONV (postoperative nausea and vomiting)    Prinzmetal's angina (HCC)    normal coronary arteries with coronary artery vasospasm at time of cath   RBBB    Chronic    Past Surgical History:  Procedure Laterality Date   CARDIAC CATHETERIZATION  01/10/2008   coronary spasm noted on heart cath. No coronary artery disease. LV function normal   CATARACT EXTRACTION W/ INTRAOCULAR LENS IMPLANT Bilateral    colonoscopy  08/09/2005   ganem- 10 Years   CORONARY PRESSURE/FFR STUDY N/A 07/26/2020   Procedure: INTRAVASCULAR  PRESSURE WIRE/FFR STUDY;  Surgeon: Marykay Lex, MD;  Location: [facility] INVASIVE CV LAB;  Service: Cardiovascular;  Laterality: N/A;   KNEE SURGERY     LEFT HEART CATH AND CORONARY ANGIOGRAPHY N/A 07/26/2020   Procedure: LEFT HEART CATH AND CORONARY ANGIOGRAPHY;  Surgeon: Marykay Lex, MD;  Location: [facility] INVASIVE CV LAB;  Service: Cardiovascular;  Laterality: N/A;   THYROIDECTOMY     TOTAL ABDOMINAL HYSTERECTOMY W/ BILATERAL SALPINGOOPHORECTOMY      MEDICATIONS:  ALPRAZolam (XANAX) 0.5 MG tablet   amLODipine (NORVASC) 2.5 MG tablet   aspirin 81 MG tablet   Calcium Carb-Cholecalciferol (CALCIUM + D3 PO)   carboxymethylcellulose (REFRESH PLUS) 0.5 % SOLN   cetirizine (ZYRTEC) 10 MG tablet   ezetimibe (ZETIA) 10 MG tablet   famotidine (PEPCID) 20 MG tablet   levothyroxine (SYNTHROID, LEVOTHROID) 50 MCG tablet   lisinopril-hydrochlorothiazide (PRINZIDE,ZESTORETIC) 20-25 MG per tablet   Magnesium 250 MG CAPS   Multiple Vitamins-Minerals (OCUVITE ADULT 50+ PO)   Omega-3 Fatty Acids (FISH OIL PO)   PARoxetine (PAXIL) 20 MG tablet   potassium chloride SA (K-DUR,KLOR-CON) 20 MEQ tablet   risedronate (ACTONEL) 35 MG tablet   rosuvastatin (CRESTOR) 5 MG tablet   No current facility-administered medications for this encounter.    [name], PA-C [facility]-Surgical Testing [phone]

## 2023-04-02 ENCOUNTER — Telehealth: Payer: Self-pay | Admitting: Cardiology

## 2023-04-02 NOTE — Telephone Encounter (Signed)
Pt would like a c/b regarding upcoming procedure. Please advise

## 2023-04-02 NOTE — Telephone Encounter (Signed)
Spoke with pt who was concerned about her K+ level that was recently drawn prior to her surgery.  Level was 3.4.  pt is containing with her supplement as ordered.  Advised to increase foods high in potassium.  List of foods reviewed.  She also wanted to know if OK to take Tylenol.  Advised OK to take.  She was appreciative of the call back and information.

## 2023-04-02 NOTE — Telephone Encounter (Signed)
 Pt returning call, requesting cb

## 2023-04-02 NOTE — Telephone Encounter (Signed)
Left message for pt to call back to discuss her concerns. 

## 2023-04-09 ENCOUNTER — Encounter (HOSPITAL_COMMUNITY)
Admission: RE | Disposition: A | Discharge status: Home / Self Care | Payer: Self-pay | Source: Home / Self Care | Attending: Orthopedic Surgery

## 2023-04-09 ENCOUNTER — Encounter (HOSPITAL_COMMUNITY): Payer: Self-pay | Admitting: Orthopedic Surgery

## 2023-04-09 ENCOUNTER — Ambulatory Visit (HOSPITAL_BASED_OUTPATIENT_CLINIC_OR_DEPARTMENT_OTHER): Payer: Self-pay | Admitting: Anesthesiology

## 2023-04-09 ENCOUNTER — Ambulatory Visit (HOSPITAL_COMMUNITY)
Admission: RE | Admit: 2023-04-09 | Discharge: 2023-04-09 | Disposition: A | Discharge status: Home / Self Care | Payer: Medicare HMO | Attending: Orthopedic Surgery | Admitting: Orthopedic Surgery

## 2023-04-09 ENCOUNTER — Ambulatory Visit (HOSPITAL_COMMUNITY): Payer: Medicare HMO | Admitting: Physician Assistant

## 2023-04-09 ENCOUNTER — Ambulatory Visit (HOSPITAL_COMMUNITY): Payer: Medicare HMO

## 2023-04-09 ENCOUNTER — Other Ambulatory Visit: Payer: Self-pay

## 2023-04-09 DIAGNOSIS — I2510 Atherosclerotic heart disease of native coronary artery without angina pectoris: Secondary | ICD-10-CM | POA: Insufficient documentation

## 2023-04-09 DIAGNOSIS — M19011 Primary osteoarthritis, right shoulder: Secondary | ICD-10-CM | POA: Diagnosis not present

## 2023-04-09 DIAGNOSIS — M75101 Unspecified rotator cuff tear or rupture of right shoulder, not specified as traumatic: Secondary | ICD-10-CM

## 2023-04-09 DIAGNOSIS — E039 Hypothyroidism, unspecified: Secondary | ICD-10-CM | POA: Diagnosis not present

## 2023-04-09 DIAGNOSIS — I739 Peripheral vascular disease, unspecified: Secondary | ICD-10-CM | POA: Diagnosis not present

## 2023-04-09 DIAGNOSIS — I10 Essential (primary) hypertension: Secondary | ICD-10-CM | POA: Insufficient documentation

## 2023-04-09 DIAGNOSIS — M75121 Complete rotator cuff tear or rupture of right shoulder, not specified as traumatic: Secondary | ICD-10-CM | POA: Diagnosis not present

## 2023-04-09 DIAGNOSIS — Z96611 Presence of right artificial shoulder joint: Secondary | ICD-10-CM | POA: Diagnosis not present

## 2023-04-09 DIAGNOSIS — G8918 Other acute postprocedural pain: Secondary | ICD-10-CM | POA: Diagnosis not present

## 2023-04-09 DIAGNOSIS — Z471 Aftercare following joint replacement surgery: Secondary | ICD-10-CM | POA: Diagnosis not present

## 2023-04-09 DIAGNOSIS — F419 Anxiety disorder, unspecified: Secondary | ICD-10-CM | POA: Insufficient documentation

## 2023-04-09 DIAGNOSIS — Z79899 Other long term (current) drug therapy: Secondary | ICD-10-CM | POA: Insufficient documentation

## 2023-04-09 DIAGNOSIS — M13811 Other specified arthritis, right shoulder: Secondary | ICD-10-CM | POA: Diagnosis not present

## 2023-04-09 DIAGNOSIS — F32A Depression, unspecified: Secondary | ICD-10-CM | POA: Insufficient documentation

## 2023-04-09 HISTORY — PX: REVERSE SHOULDER ARTHROPLASTY: SHX5054

## 2023-04-09 SURGERY — ARTHROPLASTY, SHOULDER, TOTAL, REVERSE
Anesthesia: General | Site: Shoulder | Laterality: Right

## 2023-04-09 MED ORDER — GLYCOPYRROLATE 0.2 MG/ML IJ SOLN
INTRAMUSCULAR | Status: DC | PRN
  Administered 2023-04-09: .2 mg via INTRAVENOUS

## 2023-04-09 MED ORDER — EPHEDRINE SULFATE-NACL 50-0.9 MG/10ML-% IV SOSY
PREFILLED_SYRINGE | INTRAVENOUS | Status: DC | PRN
  Administered 2023-04-09: 5 mg via INTRAVENOUS

## 2023-04-09 MED ORDER — CEFAZOLIN SODIUM-DEXTROSE 2-4 GM/100ML-% IV SOLN
2.0000 g | INTRAVENOUS | Status: AC
  Administered 2023-04-09: 2 g via INTRAVENOUS
  Filled 2023-04-09: qty 100

## 2023-04-09 MED ORDER — BUPIVACAINE LIPOSOME 1.3 % IJ SUSP
INTRAMUSCULAR | Status: DC | PRN
Start: 2023-04-09 — End: 2023-04-09
  Administered 2023-04-09: 10 mL via PERINEURAL

## 2023-04-09 MED ORDER — FENTANYL CITRATE (PF) 100 MCG/2ML IJ SOLN
INTRAMUSCULAR | Status: AC
  Filled 2023-04-09: qty 2

## 2023-04-09 MED ORDER — METHOCARBAMOL 500 MG PO TABS
ORAL_TABLET | ORAL | Status: AC
  Filled 2023-04-09: qty 1

## 2023-04-09 MED ORDER — METHOCARBAMOL 1000 MG/10ML IJ SOLN: 500.0000 mg | Freq: Four times a day (QID) | INTRAMUSCULAR | Status: DC | PRN

## 2023-04-09 MED ORDER — PHENYLEPHRINE HCL-NACL 20-0.9 MG/250ML-% IV SOLN
INTRAVENOUS | Status: DC | PRN
  Administered 2023-04-09: 40 ug/min via INTRAVENOUS

## 2023-04-09 MED ORDER — PHENYLEPHRINE 80 MCG/ML (10ML) SYRINGE FOR IV PUSH (FOR BLOOD PRESSURE SUPPORT)
PREFILLED_SYRINGE | INTRAVENOUS | Status: AC
Start: 2023-04-09 — End: ?
  Filled 2023-04-09: qty 10

## 2023-04-09 MED ORDER — SUCCINYLCHOLINE CHLORIDE 200 MG/10ML IV SOSY
PREFILLED_SYRINGE | INTRAVENOUS | Status: DC | PRN
  Administered 2023-04-09: 100 mg via INTRAVENOUS

## 2023-04-09 MED ORDER — MORPHINE SULFATE (PF) 2 MG/ML IV SOLN: 0.5000 mg | INTRAVENOUS | Status: DC | PRN

## 2023-04-09 MED ORDER — BUPIVACAINE HCL (PF) 0.5 % IJ SOLN
INTRAMUSCULAR | Status: DC | PRN
Start: 2023-04-09 — End: 2023-04-09
  Administered 2023-04-09: 10 mL via PERINEURAL

## 2023-04-09 MED ORDER — FENTANYL CITRATE PF 50 MCG/ML IJ SOSY: 25.0000 ug | PREFILLED_SYRINGE | INTRAMUSCULAR | Status: DC | PRN

## 2023-04-09 MED ORDER — LACTATED RINGERS IV SOLN: INTRAVENOUS | Status: DC

## 2023-04-09 MED ORDER — ONDANSETRON HCL 4 MG/2ML IJ SOLN
INTRAMUSCULAR | Status: DC | PRN
  Administered 2023-04-09: 4 mg via INTRAVENOUS

## 2023-04-09 MED ORDER — METOCLOPRAMIDE HCL 5 MG/ML IJ SOLN: 5.0000 mg | Freq: Three times a day (TID) | INTRAMUSCULAR | Status: DC | PRN

## 2023-04-09 MED ORDER — TRANEXAMIC ACID-NACL 1000-0.7 MG/100ML-% IV SOLN
INTRAVENOUS | Status: AC
  Filled 2023-04-09: qty 100

## 2023-04-09 MED ORDER — FENTANYL CITRATE (PF) 100 MCG/2ML IJ SOLN
INTRAMUSCULAR | Status: DC | PRN
  Administered 2023-04-09: 100 ug via INTRAVENOUS

## 2023-04-09 MED ORDER — STERILE WATER FOR IRRIGATION IR SOLN
Status: DC | PRN
  Administered 2023-04-09: 1000 mL

## 2023-04-09 MED ORDER — BUPIVACAINE-EPINEPHRINE (PF) 0.25% -1:200000 IJ SOLN
INTRAMUSCULAR | Status: DC | PRN
  Administered 2023-04-09: 13 mL

## 2023-04-09 MED ORDER — TRANEXAMIC ACID-NACL 1000-0.7 MG/100ML-% IV SOLN
1000.0000 mg | INTRAVENOUS | Status: AC
  Administered 2023-04-09 (×2): 1000 mg via INTRAVENOUS
  Filled 2023-04-09: qty 100

## 2023-04-09 MED ORDER — ONDANSETRON HCL 4 MG/2ML IJ SOLN: 4.0000 mg | Freq: Once | INTRAMUSCULAR | Status: DC | PRN

## 2023-04-09 MED ORDER — 0.9 % SODIUM CHLORIDE (POUR BTL) OPTIME
TOPICAL | Status: DC | PRN
  Administered 2023-04-09: 1000 mL

## 2023-04-09 MED ORDER — ONDANSETRON HCL 4 MG/2ML IJ SOLN: 4.0000 mg | Freq: Four times a day (QID) | INTRAMUSCULAR | Status: DC | PRN

## 2023-04-09 MED ORDER — METOCLOPRAMIDE HCL 5 MG PO TABS: 5.0000 mg | ORAL_TABLET | Freq: Three times a day (TID) | ORAL | Status: DC | PRN

## 2023-04-09 MED ORDER — PROPOFOL 10 MG/ML IV BOLUS
INTRAVENOUS | Status: AC
  Filled 2023-04-09: qty 20

## 2023-04-09 MED ORDER — DEXAMETHASONE SODIUM PHOSPHATE 10 MG/ML IJ SOLN
INTRAMUSCULAR | Status: DC | PRN
  Administered 2023-04-09: 8 mg via INTRAVENOUS

## 2023-04-09 MED ORDER — HYDROCODONE-ACETAMINOPHEN 7.5-325 MG PO TABS: 1.0000 | ORAL_TABLET | ORAL | Status: DC | PRN

## 2023-04-09 MED ORDER — LIDOCAINE HCL (CARDIAC) PF 100 MG/5ML IV SOSY
PREFILLED_SYRINGE | INTRAVENOUS | Status: DC | PRN
  Administered 2023-04-09: 60 mg via INTRATRACHEAL

## 2023-04-09 MED ORDER — TRANEXAMIC ACID-NACL 1000-0.7 MG/100ML-% IV SOLN
1000.0000 mg | Freq: Once | INTRAVENOUS | Status: AC
  Administered 2023-04-09: 1000 mg via INTRAVENOUS

## 2023-04-09 MED ORDER — BUPIVACAINE-EPINEPHRINE (PF) 0.25% -1:200000 IJ SOLN
INTRAMUSCULAR | Status: AC
  Filled 2023-04-09: qty 30

## 2023-04-09 MED ORDER — ONDANSETRON HCL 4 MG PO TABS: 4.0000 mg | ORAL_TABLET | Freq: Four times a day (QID) | ORAL | Status: DC | PRN

## 2023-04-09 MED ORDER — PROPOFOL 10 MG/ML IV BOLUS
INTRAVENOUS | Status: DC | PRN
  Administered 2023-04-09: 200 mg via INTRAVENOUS
  Administered 2023-04-09: 70 mg via INTRAVENOUS

## 2023-04-09 MED ORDER — FENTANYL CITRATE PF 50 MCG/ML IJ SOSY
50.0000 ug | PREFILLED_SYRINGE | INTRAMUSCULAR | Status: DC
  Administered 2023-04-09: 50 ug via INTRAVENOUS
  Filled 2023-04-09: qty 2

## 2023-04-09 MED ORDER — MIDAZOLAM HCL 2 MG/2ML IJ SOLN: 1.0000 mg | INTRAMUSCULAR | Status: DC

## 2023-04-09 MED ORDER — LIDOCAINE HCL (PF) 2 % IJ SOLN
INTRAMUSCULAR | Status: AC
  Filled 2023-04-09: qty 5

## 2023-04-09 MED ORDER — SUCCINYLCHOLINE CHLORIDE 200 MG/10ML IV SOSY
PREFILLED_SYRINGE | INTRAVENOUS | Status: AC
  Filled 2023-04-09: qty 10

## 2023-04-09 MED ORDER — CHLORHEXIDINE GLUCONATE 0.12 % MT SOLN
15.0000 mL | Freq: Once | OROMUCOSAL | Status: AC
  Administered 2023-04-09: 15 mL via OROMUCOSAL

## 2023-04-09 MED ORDER — ORAL CARE MOUTH RINSE: 15.0000 mL | Freq: Once | OROMUCOSAL | Status: AC

## 2023-04-09 MED ORDER — ACETAMINOPHEN 500 MG PO TABS
1000.0000 mg | ORAL_TABLET | Freq: Once | ORAL | Status: AC
  Administered 2023-04-09: 1000 mg via ORAL
  Filled 2023-04-09: qty 2

## 2023-04-09 MED ORDER — METHOCARBAMOL 500 MG PO TABS
500.0000 mg | ORAL_TABLET | Freq: Four times a day (QID) | ORAL | Status: DC | PRN
  Administered 2023-04-09: 500 mg via ORAL

## 2023-04-09 SURGICAL SUPPLY — 64 items
BAG COUNTER SPONGE SURGICOUNT (BAG) IMPLANT
BAG ZIPLOCK 12X15 (MISCELLANEOUS) IMPLANT
BIT DRILL 1.6MX128 (BIT) IMPLANT
BIT DRILL 170X2.5X (BIT) IMPLANT
BIT DRL 170X2.5X (BIT) ×1 IMPLANT
BLADE SAG 18X100X1.27 (BLADE) ×2 IMPLANT
COVER BACK TABLE 60X90IN (DRAPES) ×2 IMPLANT
COVER SURGICAL LIGHT HANDLE (MISCELLANEOUS) ×2 IMPLANT
DRAPE INCISE IOBAN 66X45 STRL (DRAPES) ×2 IMPLANT
DRAPE SHEET LG 3/4 BI-LAMINATE (DRAPES) ×2 IMPLANT
DRAPE SURG ORHT 6 SPLT 77X108 (DRAPES) ×4 IMPLANT
DRAPE TOP 10253 STERILE (DRAPES) ×2 IMPLANT
DRAPE U-SHAPE 47X51 STRL (DRAPES) ×2 IMPLANT
DRSG ADAPTIC 3X8 NADH LF (GAUZE/BANDAGES/DRESSINGS) ×2 IMPLANT
DRSG AQUACEL AG ADV 3.5X10 (GAUZE/BANDAGES/DRESSINGS) IMPLANT
DURAPREP 26ML APPLICATOR (WOUND CARE) ×2 IMPLANT
ECCENTRIC EPIPHYSI MODULAR SZ1 (Trauma) IMPLANT
ELECT BLADE TIP CTD 4 INCH (ELECTRODE) ×2 IMPLANT
ELECT NDL TIP 2.8 STRL (NEEDLE) ×2 IMPLANT
ELECT NEEDLE TIP 2.8 STRL (NEEDLE) ×1 IMPLANT
ELECT REM PT RETURN 15FT ADLT (MISCELLANEOUS) ×2 IMPLANT
FACESHIELD WRAPAROUND (MASK) ×1 IMPLANT
FACESHIELD WRAPAROUND OR TEAM (MASK) ×2 IMPLANT
GAUZE PAD ABD 8X10 STRL (GAUZE/BANDAGES/DRESSINGS) ×2 IMPLANT
GAUZE SPONGE 4X4 12PLY STRL (GAUZE/BANDAGES/DRESSINGS) ×2 IMPLANT
GLENOSPHERE DELTA XTEND LAT 38 (Miscellaneous) IMPLANT
GLOVE BIOGEL PI IND STRL 7.5 (GLOVE) ×2 IMPLANT
GLOVE BIOGEL PI IND STRL 8.5 (GLOVE) ×2 IMPLANT
GLOVE ORTHO TXT STRL SZ7.5 (GLOVE) ×2 IMPLANT
GLOVE SURG ORTHO 8.5 STRL (GLOVE) ×2 IMPLANT
GOWN STRL REUS W/ TWL XL LVL3 (GOWN DISPOSABLE) ×4 IMPLANT
KIT BASIN OR (CUSTOM PROCEDURE TRAY) ×2 IMPLANT
KIT TURNOVER KIT A (KITS) IMPLANT
MANIFOLD NEPTUNE II (INSTRUMENTS) ×2 IMPLANT
METAGLENE DELTA EXTEND (Trauma) IMPLANT
METAGLENE DXTEND (Trauma) ×1 IMPLANT
MODULAR ECCENTRIC EPIPHYSI SZ1 (Trauma) ×1 IMPLANT
NDL MA TROC 1/2 CIR (NEEDLE) IMPLANT
NDL MAYO CATGUT SZ4 TPR NDL (NEEDLE) IMPLANT
NEEDLE MA TROC 1/2 CIR (NEEDLE) ×1 IMPLANT
NEEDLE MAYO CATGUT SZ4 (NEEDLE) IMPLANT
NS IRRIG 1000ML POUR BTL (IV SOLUTION) ×2 IMPLANT
PACK SHOULDER (CUSTOM PROCEDURE TRAY) ×2 IMPLANT
PIN GUIDE 1.2 (PIN) IMPLANT
PIN GUIDE GLENOPHERE 1.5MX300M (PIN) IMPLANT
PIN METAGLENE 2.5 (PIN) IMPLANT
PIN STEINMAN FIXATION KNEE (PIN) IMPLANT
RESTRAINT HEAD UNIVERSAL NS (MISCELLANEOUS) ×2 IMPLANT
SCREW 4.5X36MM (Screw) IMPLANT
SLING ARM FOAM STRAP LRG (SOFTGOODS) IMPLANT
SLING ARM FOAM STRAP MED (SOFTGOODS) IMPLANT
SPACER 38 PLUS 3 (Spacer) IMPLANT
SPIKE FLUID TRANSFER (MISCELLANEOUS) ×2 IMPLANT
SPONGE T-LAP 4X18 ~~LOC~~+RFID (SPONGE) IMPLANT
STEM DELTA DIA 10 HA (Stem) IMPLANT
STRIP CLOSURE SKIN 1/2X4 (GAUZE/BANDAGES/DRESSINGS) ×2 IMPLANT
SUT FIBERWIRE #2 38 T-5 BLUE (SUTURE) ×3 IMPLANT
SUT MNCRL AB 4-0 PS2 18 (SUTURE) ×2 IMPLANT
SUT VIC AB 0 CT1 36 (SUTURE) ×2 IMPLANT
SUT VIC AB 0 CT2 27 (SUTURE) ×2 IMPLANT
SUT VIC AB 2-0 CT1 TAPERPNT 27 (SUTURE) ×2 IMPLANT
SUTURE FIBERWR #2 38 T-5 BLUE (SUTURE) ×2 IMPLANT
TOWEL GREEN STERILE FF (TOWEL DISPOSABLE) ×2 IMPLANT
TOWEL OR 17X26 10 PK STRL BLUE (TOWEL DISPOSABLE) ×2 IMPLANT

## 2023-04-09 NOTE — Evaluation (Signed)
 Occupational Therapy Evaluation Patient Details Name: Vanessa Stewart MRN: [id number] DOB: 1945-12-23 Today's Date: 04/09/2023   History of Present Illness   78 yo s/p R Reverse TSA. PMH: DM, CAD, Heart attack, Stroke.     Clinical Impressions PTA pt lives alone independently .  Education completed regarding compensatory strategies for ADL tasks and functional mobility, management of sling, RUE ROM per specified parameters in the order set as indicated below, positioning of operative arm in sitting and supine and edema control, including use of "Iceman" Cold Therapy machine. Caregiver present for education, written handouts provided and reviewed using Teach Back and pt/caregiver verbalized/demonstrated understanding. Due to the below listed deficits, pt requires min assistance with ADL tasks and CGA with functional mobility. Caregiver will be able to provide necessary level of assistance at discharge. Pt to follow up with MD to progress rehab of the operative shoulder.      If plan is discharge home, recommend the following:   A little help with walking and/or transfers;A little help with bathing/dressing/bathroom;Assistance with cooking/housework     Functional Status Assessment         Equipment Recommendations   None recommended by OT     Recommendations for Other Services         Precautions/Restrictions   Precautions Precautions: Shoulder Precaution Booklet Issued: Yes (comment) Required Braces or Orthoses: Sling Restrictions;No shoulder ROM allowed; elbow/wrist/hand AROM only; May use RUE for light ADL tasks in front of her Weight Bearing Restrictions Per Provider Order: Yes RUE Weight Bearing Per Provider Order: Non weight bearing     Mobility Bed Mobility               General bed mobility comments: Educated on positioning in bed adn bed mobility    Transfers Overall transfer level: Needs assistance Equipment used: 1 person hand held assist                       Balance Overall balance assessment: Mild deficits observed, not formally tested                                         ADL either performed or assessed with clinical judgement   ADL Overall ADL's : Needs assistance/impaired  Per orders, RUE shoulder parameters as follows for ADL tasks: No shoulder ROM; elbow/wrist/hand ROM only. Allowed to move RUE for light ADL tasks in front of her. No pulling/pushing/weightbearing with RUE. While moving within specified parameters, pt/caregiver instructed on bathing and how to donn/doff shirt, placing operative arm through sleeve first when donning and off last when doffing.Pt/caregiver educated on compensatory strategies for LB ADL and strategies to reduce risk of falls.  Pt/caregiver educated on donning/doffing sling and to wear the sling at all times with the exception of ADL, and to loosen the neck strap of the sling when the operative arm is in a supported position when sitting. In sitting or supine, pt instructed to have a pillow behind and under their operative arm to provide support. If assist needed with ambulation, caregiver educated on the importance of walking on pt's non-operative side.  Education regarding use of "IceMan" Cold Therapy completed, including the importance of using a barrier on the shoulder prior to positioning the wrap-on pad. Pt/caregiver verbalized/demonstrated understanding. Teach Back used while caregiver assisted with dressing pt to facilitate DC.  Vision         Perception         Praxis         Pertinent Vitals/Pain Pain Assessment Pain Assessment: No/denies pain     Extremity/Trunk Assessment Upper Extremity Assessment Upper Extremity Assessment: Right hand dominant           Communication     Cognition Arousal: Alert Behavior During Therapy: WFL for tasks assessed/performed Cognition: No apparent  impairments                                       Cueing  General Comments          Exercises Exercises: Shoulder Shoulder Exercises Elbow Flexion: AAROM, Right, 5 reps Elbow Extension: AAROM, Right, 5 reps Wrist Flexion: AAROM, Right, 5 reps Wrist Extension: AAROM, Right, 5 reps Digit Composite Flexion: AAROM, Right, Both Composite Extension: AAROM, Right, 5 reps Neck Flexion: 5 reps, AROM Neck Extension: AROM, 5 reps Neck Lateral Flexion - Right: AROM, 5 reps Neck Lateral Flexion - Left: AROM, 5 reps   Shoulder Instructions Shoulder Instructions Donning/doffing shirt without moving shoulder: Caregiver independent with task Method for sponge bathing under operated UE: Caregiver independent with task Donning/doffing sling/immobilizer: Caregiver independent with task Correct positioning of sling/immobilizer: Caregiver independent with task ROM for elbow, wrist and digits of operated UE: Caregiver independent with task Sling wearing schedule (on at all times/off for ADL's): Caregiver independent with task Positioning of UE while sleeping: Caregiver independent with task    Home Living Family/patient expects to be discharged to:: Private residence Living Arrangements: Alone;Children Available Help at Discharge: Family;Available 24 hours/day Type of Home: House Home Access: Stairs to enter [employer] of Steps: 1   Home Layout: One level     Bathroom Shower/Tub: IT trainer: Standard Bathroom Accessibility: Yes How Accessible: Accessible via walker Home Equipment: Cane - single point          Prior Functioning/Environment Prior Level of Function : Independent/Modified Independent;Driving                    OT Problem List: Decreased strength;Decreased range of motion;Decreased knowledge of precautions   OT Treatment/Interventions:        OT Goals(Current goals can be found in the care plan  section)   Acute Rehab OT Goals Patient Stated Goal: independence OT Goal Formulation: All assessment and education complete, DC therapy   OT Frequency:       Co-evaluation              AM-PAC OT "6 Clicks" Daily Activity     Outcome Measure Help from another person eating meals?: A Little Help from another person taking care of personal grooming?: A Little Help from another person toileting, which includes using toliet, bedpan, or urinal?: A Little Help from another person bathing (including washing, rinsing, drying)?: A Little Help from another person to put on and taking off regular upper body clothing?: A Little Help from another person to put on and taking off regular lower body clothing?: A Little 6 Click Score: 18   End of Session Nurse Communication:  (ready for DC)  Activity Tolerance: Patient tolerated treatment well Patient left:    OT Visit Diagnosis: Muscle weakness (generalized) (M62.81)                Time: 1610-9604 OT Time Calculation (  min): 36 min Charges:  OT General Charges $OT Visit: 1 Visit OT Evaluation $OT Eval Low Complexity: 1 Low OT Treatments $Self Care/Home Management : 8-22 mins  Luisa Dago, OT/L   Acute OT Clinical Specialist Acute Rehabilitation Services Pager [phone] Office [phone]   [facility] 04/09/2023, 4:25 PM

## 2023-04-09 NOTE — Anesthesia Procedure Notes (Signed)
 Procedure Name: Intubation Date/Time: 04/09/2023 11:07 AM  Performed by: Micki Riley, CRNAPre-anesthesia Checklist: Patient identified, Emergency Drugs available, Suction available and Patient being monitored Patient Re-evaluated:Patient Re-evaluated prior to induction Oxygen Delivery Method: Circle System Utilized Preoxygenation: Pre-oxygenation with 100% oxygen Induction Type: IV induction Ventilation: Mask ventilation without difficulty Laryngoscope Size: Miller and 2 Grade View: Grade I Tube type: Oral Endobronchial tube: Left Tube size: 7.0 mm Number of attempts: 1 Airway Equipment and Method: Stylet and Oral airway Placement Confirmation: ETT inserted through vocal cords under direct vision, positive ETCO2 and breath sounds checked- equal and bilateral Tube secured with: Tape Dental Injury: Teeth and Oropharynx as per pre-operative assessment

## 2023-04-09 NOTE — Care Plan (Signed)
 Ortho Bundle Case Management Note  Patient Details  Name: Vanessa Stewart MRN: [id number] Date of Birth: November 23, 1945                  R Rev TSA on 04/09/23. DCP: Home with two daughters. DME: No needs. PT: HEP   DME Arranged:  N/A DME Agency:     HH Arranged:    HH Agency:     Additional Comments: Please contact me with any questions of if this plan should need to change.    Despina Pole, CCM Case Manager, [facility]  [phone] 04/09/2023, 1:47 PM

## 2023-04-09 NOTE — Brief Op Note (Signed)
 04/09/2023  1:10 PM  PATIENT:  Vanessa Stewart  78 y.o. female  PRE-OPERATIVE DIAGNOSIS:  Right shoulder rotator cuff tear, osteoarthritis end stage  POST-OPERATIVE DIAGNOSIS:  Right shoulder rotator cuff tear, osteoarthritis end stage  PROCEDURE:  Right Reverse TSA, DePuy Delta Xtend WITH subscap repair   SURGEON:  Surgeons and Role:    Beverely Low, MD - Primary  PHYSICIAN ASSISTANT:   ASSISTANTS: Thea Gist, PA-C   ANESTHESIA:   regional and general  EBL:  50 mL   BLOOD ADMINISTERED:none  DRAINS: none   LOCAL MEDICATIONS USED:  MARCAINE     SPECIMEN:  No Specimen  DISPOSITION OF SPECIMEN:  N/A  COUNTS:  YES  TOURNIQUET:  * No tourniquets in log *  DICTATION: .Other Dictation: Dictation Number [id number]  PLAN OF CARE: Discharge to home after PACU  PATIENT DISPOSITION:  PACU - hemodynamically stable.   Delay start of Pharmacological VTE agent (>24hrs) due to surgical blood loss or risk of bleeding: not applicable

## 2023-04-09 NOTE — Op Note (Signed)
 NAMETANZIE, ROTHSCHILD MEDICAL RECORD NO: [id number] ACCOUNT NO: [id number] DATE OF BIRTH: 22-Sep-1945 FACILITY: [facility] LOCATION: [facility] PHYSICIAN: Almedia Balls. Ranell Patrick, MD  Operative Report   DATE OF PROCEDURE: 04/09/2023  PREOPERATIVE DIAGNOSES: 1.  Right shoulder rotator cuff tear. 2.  Severe shoulder arthritis.  POSTOPERATIVE DIAGNOSES: 1.  Right shoulder rotator cuff tear. 2.  Severe shoulder arthritis.  PROCEDURE PERFORMED:  Right reverse total shoulder arthroplasty using DePuy Delta Xtend prosthesis.  ATTENDING SURGEON:  Almedia Balls. Ranell Patrick, MD  ASSISTANT:  Konrad Felix Dixon, [place], who was scrubbed during the entire procedure, and necessary for satisfactory completion of surgery.  ANESTHESIA:  General anesthesia was used plus interscalene block.  ESTIMATED BLOOD LOSS:  100 mL.  FLUID REPLACEMENT:  1000 mL crystalloid.  COUNTS:  Instrument counts correct.  COMPLICATIONS:  No complications.  ANTIBIOTICS:  Perioperative antibiotics were given.  INDICATIONS:  The patient is a 78 year old female with worsening right shoulder pain and dysfunction secondary to rotator cuff tear and some arthritis.  The patient has failed conservative management and desires operative treatment to relieve pain and  restore function.  Informed consent obtained.  DESCRIPTION OF PROCEDURE:  After an adequate level of anesthesia was achieved, the patient was positioned in a modified beach chair position.  The right shoulder was correctly identified and sterile prep and drape were performed.  Timeout called  verifying correct patient and correct site.  We entered the patient's shoulder using a standard deltopectoral approach starting at the coracoid process and extending down to the anterior humerus.  Dissection down through subcutaneous tissues using Bovie.   Cephalic vein identified and taken laterally to the deltoid.  Pectoralis was taken medially.  The conjoined tendon was identified and retracted  medially.  Deep retractors were placed.  The biceps tenodesed in situ with 0 Vicryl figure-of-eight suture  x2.  We then released the subscapularis subperiosteally off the lesser tuberosity and placed #2 FiberWire suture in a modified Mason-Allen suture technique for repair.  We released the inferior capsule extending to the shoulder and delivering the humeral  head out of the wound.  We entered the proximal humerus, which showed severe cartilage loss with a 6-mm reamer reaming up to a size 10.  We then placed our 10-mm T-handle guide and resected the head of 20 degrees of retroversion with the oscillating  saw.  We removed excess osteophytes with a rongeur.  We then subluxed the humerus posteriorly.  We then placed our deep retractors for the glenoid, removed the biceps stump, the labrum, and released the capsule.  We were careful to protect the axillary  nerve with our retractors in place and good visualization, removing the remaining cartilage from the glenoid.  We placed our guide pin centered on the glenoid, which was quite small.  We reamed the subchondral bone for the metaglene.  We then did our  peripheral hand reaming with the T-handle reamer.  We drilled out our central peg hole, irrigated thoroughly, and impacted the HA-coated, press-fit baseplate into position.  We placed a 36 screw inferiorly, a 36 screw at the base of the coracoid, and all  we could do was two screws as the patient had a small AP diameter glenoid.  With the baseplate secured, we selected a 38+0 standard glenosphere and attached that to the baseplate with a screwdriver.  I did a finger sweep to make sure we had no soft  tissue caught in between the base plate and the glenosphere.  We went back to  the humerus and reamed for the one right metaphysis.  We then trialled with a 10 stem in the one right metaphysis set in the 0 setting and placed in 20 degrees of retroversion.   We selected a 38+3 poly trial, placed on the humeral tray  and reduced the shoulder.  We were happy with our soft tissue balance and tensioning.  We removed all trial components.  We irrigated thoroughly.  I used available bone graft from the humeral  head.  I also drilled holes in the lesser tuberosity and placed #2 FiberWire suture for repair of the subscapularis.  With the suture in place, after having irrigated, we used available bone graft in impaction grafting technique and impacted the  HA-coated Press-Fit 10 stem and the one right metaphysis set on the 0 setting and impacted in 20 degrees of retroversion.  The stem was very stable.  We selected the real 38 +3 poly, placed on the humeral tray and impacted the tray.  We had a nice stable  shoulder throughout a full range of motion.  No impingement.  We then repaired the subscapularis meticulously back to bone.  We then repaired deltopectoral interval with 0 Vicryl suture after irrigating thoroughly and then 2-0 Vicryl subcuticular  closure and 4-0 running Monocryl for skin.  Steri-Strips were applied followed by a sterile dressing.  The patient tolerated the surgery well.   PUS D: 04/09/2023 1:16:17 pm T: 04/09/2023 3:06:00 pm  JOB: [id number]/ [id number]

## 2023-04-09 NOTE — Discharge Instructions (Signed)
 Ice to the shoulder constantly.  Change the bandage to Aquacel on Sunday or Monday. Keep the incision covered and clean and dry for one week, then ok to get it wet in the shower. Leave the incision open to air after one week .  Do exercise as instructed several times per day.  DO NOT reach behind your back or push up out of a chair with the operative arm.  Use a sling while you are up and around for comfort, may remove while seated.  Keep pillow propped behind the operative elbow.  Follow up with Dr Ranell Patrick in two weeks in the office, call [phone] for appt  Please call Dr Ranell Patrick (cell) [phone] with any questions or concerns

## 2023-04-09 NOTE — Transfer of Care (Signed)
 Immediate Anesthesia Transfer of Care Note  Patient: Vanessa Stewart  Procedure(s) Performed: REVERSE SHOULDER ARTHROPLASTY (Right: Shoulder)  Patient Location: PACU  Anesthesia Type:General  Level of Consciousness: sedated and responds to stimulation  Airway & Oxygen Therapy: Patient Spontanous Breathing  Post-op Assessment: Report given to RN  Post vital signs: stable  Last Vitals:  Vitals Value Taken Time  BP 105/81 04/09/23 1245  Temp 35.7 C 04/09/23 1245  Pulse 81 04/09/23 1248  Resp 13 04/09/23 1248  SpO2 99 % 04/09/23 1248  Vitals shown include unfiled device data.  Last Pain:  Vitals:   04/09/23 1245  TempSrc:   PainSc: Asleep         Complications: No notable events documented.

## 2023-04-09 NOTE — Anesthesia Postprocedure Evaluation (Signed)
 Anesthesia Post Note  Patient: Vanessa Stewart  Procedure(s) Performed: REVERSE SHOULDER ARTHROPLASTY (Right: Shoulder)     Patient location during evaluation: PACU Anesthesia Type: General Level of consciousness: awake and alert Pain management: pain level controlled Vital Signs Assessment: post-procedure vital signs reviewed and stable Respiratory status: spontaneous breathing, nonlabored ventilation and respiratory function stable Cardiovascular status: blood pressure returned to baseline and stable Postop Assessment: no apparent nausea or vomiting Anesthetic complications: no   No notable events documented.  Last Vitals:  Vitals:   04/09/23 1500 04/09/23 1517  BP: 138/74 115/70  Pulse: 71 65  Resp: 11   Temp:    SpO2: 92% 93%    Last Pain:  Vitals:   04/09/23 1500  TempSrc:   PainSc: 0-No pain                 Collene Schlichter

## 2023-04-09 NOTE — Anesthesia Procedure Notes (Signed)
 Anesthesia Regional Block: Interscalene brachial plexus block   Pre-Anesthetic Checklist: , timeout performed,  Correct Patient, Correct Site, Correct Laterality,  Correct Procedure, Correct Position, site marked,  Risks and benefits discussed,  Surgical consent,  Pre-op evaluation,  At surgeon's request and post-op pain management  Laterality: Right  Prep: chloraprep       Needles:  Injection technique: Single-shot  Needle Type: Echogenic Stimulator Needle     Needle Length: 9cm  Needle Gauge: 22     Additional Needles:   Procedures:,,,, ultrasound used (permanent image in chart),,    Narrative:  Start time: 04/09/2023 9:28 AM End time: 04/09/2023 9:36 AM Injection made incrementally with aspirations every 5 mL.  Performed by: Personally  Anesthesiologist: Collene Schlichter, MD  Additional Notes: Functioning IV was confirmed and monitors were applied.  A 90mm 22ga echogenic stimulator needle was used. Sterile prep and drape, hand hygiene, and sterile gloves were used.  Negative aspiration and negative test dose prior to incremental administration of local anesthetic. The patient tolerated the procedure well.  Ultrasound guidance: relevent anatomy identified, needle position confirmed, local anesthetic spread visualized around nerve(s), vascular puncture avoided.  Image printed for medical record.

## 2023-04-09 NOTE — Interval H&P Note (Signed)
 History and Physical Interval Note:  04/09/2023 9:02 AM  Vanessa Stewart  has presented today for surgery, with the diagnosis of Right shoulder rotator cuff tear, osteoarthritis.  The various methods of treatment have been discussed with the patient and family. After consideration of risks, benefits and other options for treatment, the patient has consented to  Procedure(s) with comments: REVERSE SHOULDER ARTHROPLASTY (Right) - interscalene block can we have the knee follow this case as a surgical intervention.  The patient's history has been reviewed, patient examined, no change in status, stable for surgery.  I have reviewed the patient's chart and labs.  Questions were answered to the patient's satisfaction.     Verlee Rossetti

## 2023-04-09 NOTE — Anesthesia Preprocedure Evaluation (Signed)
 Anesthesia Evaluation  Patient identified by MRN, date of birth, ID band Patient awake    Reviewed: Allergy & Precautions, NPO status , Patient's Chart, lab work & pertinent test results  History of Anesthesia Complications (+) PONV and history of anesthetic complications  Airway Mallampati: II  TM Distance: >3 FB Neck ROM: Full    Dental  (+) Teeth Intact, Dental Advisory Given Upper permanent bridge :   Pulmonary neg pulmonary ROS   Pulmonary exam normal breath sounds clear to auscultation       Cardiovascular hypertension, Pt. on medications + CAD and + Peripheral Vascular Disease  Normal cardiovascular exam+ dysrhythmias (RBBB)  Rhythm:Regular Rate:Normal     Neuro/Psych  Headaches PSYCHIATRIC DISORDERS Anxiety Depression       GI/Hepatic Neg liver ROS,,,  Endo/Other  Hypothyroidism    Renal/GU negative Renal ROS     Musculoskeletal  (+) Arthritis ,  Right shoulder rotator cuff tear, osteoarthritis   Abdominal   Peds  Hematology negative hematology ROS (+)   Anesthesia Other Findings Day of surgery medications reviewed with the patient.  Reproductive/Obstetrics                             Anesthesia Physical Anesthesia Plan  ASA: 3  Anesthesia Plan: General   Post-op Pain Management: Tylenol PO (pre-op)* and Regional block*   Induction: Intravenous  PONV Risk Score and Plan: 4 or greater and Dexamethasone and Ondansetron  Airway Management Planned: Oral ETT  Additional Equipment:   Intra-op Plan:   Post-operative Plan: Extubation in OR  Informed Consent: I have reviewed the patients History and Physical, chart, labs and discussed the procedure including the risks, benefits and alternatives for the proposed anesthesia with the patient or authorized representative who has indicated his/her understanding and acceptance.     Dental advisory given  Plan Discussed with:  CRNA  Anesthesia Plan Comments:        Anesthesia Quick Evaluation

## 2023-04-12 ENCOUNTER — Ambulatory Visit: Payer: Self-pay | Admitting: Family Medicine

## 2023-04-12 ENCOUNTER — Encounter (HOSPITAL_COMMUNITY): Payer: Self-pay | Admitting: Orthopedic Surgery

## 2023-04-12 NOTE — Telephone Encounter (Signed)
 Message left for patient to call back-will place in call backs   Copied from CRM [id number]. Topic: Clinical - Medication Question >> Apr 12, 2023  3:41 PM Alessandra Bevels wrote: Reason for CRM: Patients daughter is calling to report that the patient had surgery REVERSE SHOULDER ARTHROPLASTY on 04/09/23. Looks like thrush in the patients mouth. Is there any anti fungal that could be recommended OTC for this? Please advise

## 2023-04-12 NOTE — Telephone Encounter (Signed)
 Pt reports she spoke with her PCP office and they are going to reach out to her in the AM.

## 2023-04-12 NOTE — Telephone Encounter (Unsigned)
 Copied from CRM [id number]. Topic: Clinical - Medication Question >> Apr 12, 2023  3:41 PM Alessandra Bevels wrote: Reason for CRM: Patients daughter is calling to report that the patient had surgery REVERSE SHOULDER ARTHROPLASTY on 04/09/23. Looks like thrush in the patients mouth. Is there any anti fungal that could be recommended OTC for this? Please advise

## 2023-04-13 ENCOUNTER — Telehealth: Payer: Self-pay | Admitting: Cardiology

## 2023-04-13 NOTE — Telephone Encounter (Signed)
 Pt calling in asking if its safe for her to take Nystatin for oral thrush infection with her other cardiac medications.

## 2023-04-13 NOTE — Telephone Encounter (Signed)
 Patient is calling back for update. States it is important to know if they are approve for medication or now. Please advise

## 2023-04-13 NOTE — Telephone Encounter (Signed)
 Advised patient it is ok to take nystatin

## 2023-04-28 ENCOUNTER — Other Ambulatory Visit: Payer: Self-pay

## 2023-04-28 ENCOUNTER — Telehealth: Payer: Self-pay | Admitting: Physician Assistant

## 2023-04-28 DIAGNOSIS — Z4789 Encounter for other orthopedic aftercare: Secondary | ICD-10-CM | POA: Diagnosis not present

## 2023-04-28 DIAGNOSIS — I6522 Occlusion and stenosis of left carotid artery: Secondary | ICD-10-CM

## 2023-04-28 NOTE — Telephone Encounter (Signed)
   The patient called the answering service after-hours today. She had orthopedic surgery a few weeks ago. This evening she feels like her "heart is acting up." HR ranging 100-110bpm, prior history of palpitations, no clear hx of Afib per chart. Also has hx of bradycardia/AV block making it challenging to advise on extra med dosing. When this happens she gets really nervous. Feels sick to her stomach. No chest pain. Looking for information no what exactly is going on. We discussed that arrhythmias are common post surgery and unable to fully evaluate over the phone. With office being closed and ongoing symptoms, best to get checked out in ED and not to drive herself. The patient verbalized understanding and gratitude. Will cc to Dr. Apolonio Schneiders (who is also very familiar with patient) as FYI.  Laurann Montana, PA-C

## 2023-04-29 ENCOUNTER — Ambulatory Visit: Attending: Cardiology | Admitting: Cardiology

## 2023-04-29 VITALS — BP 122/80 | HR 99 | Ht 63.5 in | Wt 135.4 lb

## 2023-04-29 DIAGNOSIS — I4510 Unspecified right bundle-branch block: Secondary | ICD-10-CM

## 2023-04-29 DIAGNOSIS — I441 Atrioventricular block, second degree: Secondary | ICD-10-CM | POA: Diagnosis not present

## 2023-04-29 DIAGNOSIS — I2510 Atherosclerotic heart disease of native coronary artery without angina pectoris: Secondary | ICD-10-CM

## 2023-04-29 DIAGNOSIS — I201 Angina pectoris with documented spasm: Secondary | ICD-10-CM

## 2023-04-29 DIAGNOSIS — R001 Bradycardia, unspecified: Secondary | ICD-10-CM

## 2023-04-29 NOTE — Progress Notes (Signed)
 Cardiology Office Note:  .   Date:  04/29/2023  ID:  Vanessa Stewart, DOB 27-Mar-1945, MRN [id number] PCP: Lupita Raider, MD   HeartCare Providers Cardiologist:  Donato Schultz, MD    History of Present Illness: Vanessa Stewart   CASSIE SHEDLOCK is a 78 y.o. female Discussed the use of AI scribe software for clinical note transcription with the patient, who gave verbal consent to proceed.  History of Present Illness Vanessa Stewart is a 78 year old female with bradycardia and AV block who presents with tachycardia.  She experiences heart rates between 100 to 110 beats per minute, accompanied by sensations of her heart racing, nervousness, and gastrointestinal discomfort. No chest pain, shortness of breath, or leg swelling. She underwent orthopedic surgery three weeks ago, which may have heightened her awareness of heart rate changes. A recent alert from her Apple Watch indicated a heart rate drop to 39 beats per minute while lying down.  She has a history of bradycardia and AV block, complicating medication management. Previous EKGs have shown varying heart rates, including a heart rate of 41 beats per minute with second-degree heart block and 2:1 AV conduction. In August 2022, her EKG showed sinus rhythm with a heart rate of 85 beats per minute. A coronary CT scan in June 2022 revealed discrete mid-LAD stenosis, leading to a cardiac catheterization that showed moderate disease, managed medically. An Xeo monitor in July 2023 indicated high-degree AV block with a maximum heart rate of 109 beats per minute. She was evaluated by an electrophysiologist in July 2023, who noted second-degree AV block type 2, 2:1 block, and sleep apnea. She uses an Scientist, physiological for heart monitoring, which has shown low heart rates during sleep, suggesting a possible sleep disorder.  She has experienced significant emotional stress recently, including the death of a niece and a friend's son, which she feels may have impacted her  heart rhythm.      ROS: no syncope, no arm swelling or leg swelling  Studies Reviewed: .        Results RADIOLOGY Coronary CT scan: Discrete mid LAD stenosis (07/17/2020)  DIAGNOSTIC Cardiac catheterization: Moderate disease (07/26/2020) Xeo monitor: High degree AV block, maximum heart rate 109 bpm (08/27/2021) EKG: Normal Risk Assessment/Calculations:            Physical Exam:   VS:  BP 122/80   Pulse 99   Ht 5' 3.5" (1.613 m)   Wt 135 lb 6.4 oz (61.4 kg)   SpO2 97%   BMI 23.61 kg/m    Wt Readings from Last 3 Encounters:  04/29/23 135 lb 6.4 oz (61.4 kg)  04/09/23 141 lb (64 kg)  03/31/23 141 lb 3.2 oz (64 kg)    GEN: Well nourished, well developed in no acute distress NECK: No JVD; No carotid bruits CARDIAC: RRR, no murmurs, no rubs, no gallops RESPIRATORY:  Clear to auscultation without rales, wheezing or rhonchi  ABDOMEN: Soft, non-tender, non-distended EXTREMITIES:  No edema; No deformity   ASSESSMENT AND PLAN: .    Assessment and Plan Assessment & Plan Tachycardia Tachycardia with heart rate at 99 bpm in sinus rhythm and right bundle branch block. No atrial fibrillation or flutter on EKG. Bradycardia and AV block complicate medication management. Tachycardia may be related to recent stress, trauma, and orthopedic surgery. Current heart rate is within normal parameters with no syncope. - Reassure that current heart rate is within normal parameters. - Monitor heart rate and symptoms, especially for syncope. -  Consider a sleep study to evaluate for sleep disorder due to nighttime pauses in heart rate.  Bradycardia and AV block Bradycardia and second-degree AV block, type 2, 2:1 block, with nocturnal low heart rates. Evaluated by electrophysiologist. Low heart rates may be related to a sleep disorder. No syncope associated with low heart rates. Pacemaker not indicated due to lack of symptoms. - Avoid medications that may further slow the heart rate, such as  diltiazem. - Consider a sleep study to evaluate for sleep disorder.  Recent orthopedic surgery Underwent shoulder surgery three weeks ago. Post-surgical stress and trauma may contribute to awareness of heart rate changes. No post-surgical complications affecting heart rate. - Monitor for any post-surgical complications affecting heart rate.  Follow-up Follow-up appointments needed to monitor cardiovascular health. - Schedule a follow-up appointment with APP in six months. - Schedule a follow-up appointment with the cardiologist in one year.           Signed, Donato Schultz, MD

## 2023-04-29 NOTE — Telephone Encounter (Signed)
  Let's see if we can get her in this morning with me, ECG. Donato Schultz, MD   Attempted to contact pt to have her come in this AM for EKG with Dr Anne Fu.  No answer, left message to call back to discuss.   Pt called back and has been added onto schedule for 9 am this AM.

## 2023-04-29 NOTE — Patient Instructions (Signed)
 Medication Instructions:  The current medical regimen is effective;  continue present plan and medications.  *If you need a refill on your cardiac medications before your next appointment, please call your pharmacy*   Follow-Up: At [facility], you and your health needs are our priority.  As part of our continuing mission to provide you with exceptional heart care, we have created designated Provider Care Teams.  These Care Teams include your primary Cardiologist (physician) and Advanced Practice Providers (APPs -  Physician Assistants and Nurse Practitioners) who all work together to provide you with the care you need, when you need it.  We recommend signing up for the patient portal called "MyChart".  Sign up information is provided on this After Visit Summary.  MyChart is used to connect with patients for Virtual Visits (Telemedicine).  Patients are able to view lab/test results, encounter notes, upcoming appointments, etc.  Non-urgent messages can be sent to your provider as well.   To learn more about what you can do with MyChart, go to [email or web address].    Your next appointment:   6 month(s)  Provider:   Eligha Bridegroom, NP     Then, Donato Schultz, MD will plan to see you again in 1 year(s).       1st Floor: - Lobby - Registration  - Pharmacy  - Lab - Cafe  2nd Floor: - PV Lab - Diagnostic Testing (echo, CT, nuclear med)  3rd Floor: - Vacant  4th Floor: - TCTS (cardiothoracic surgery) - AFib Clinic - Structural Heart Clinic - Vascular Surgery  - Vascular Ultrasound  5th Floor: - HeartCare Cardiology (general and EP) - Clinical Pharmacy for coumadin, hypertension, lipid, weight-loss medications, and med management appointments    Valet parking services will be available as well.

## 2023-05-10 ENCOUNTER — Ambulatory Visit (INDEPENDENT_AMBULATORY_CARE_PROVIDER_SITE_OTHER): Payer: Medicare HMO | Admitting: Physician Assistant

## 2023-05-10 ENCOUNTER — Ambulatory Visit (HOSPITAL_COMMUNITY)
Admission: RE | Admit: 2023-05-10 | Discharge: 2023-05-10 | Disposition: A | Discharge status: Home / Self Care | Payer: Medicare HMO | Source: Ambulatory Visit | Attending: Surgery | Admitting: Surgery

## 2023-05-10 ENCOUNTER — Encounter: Payer: Self-pay | Admitting: Physician Assistant

## 2023-05-10 VITALS — BP 129/76 | HR 64 | Temp 98.2°F | Ht 63.0 in | Wt 135.4 lb

## 2023-05-10 DIAGNOSIS — I6522 Occlusion and stenosis of left carotid artery: Secondary | ICD-10-CM | POA: Insufficient documentation

## 2023-05-10 DIAGNOSIS — I6523 Occlusion and stenosis of bilateral carotid arteries: Secondary | ICD-10-CM

## 2023-05-10 NOTE — Progress Notes (Signed)
 HISTORY AND PHYSICAL     CC:  follow up. Requesting Provider:  Lupita Raider, MD  HPI: This is a 78 y.o. female here for follow up for carotid artery stenosis.  She was originally seen by Dr. Arbie Cookey in 2019 after a lifeline screening suggested carotid artery stenosis.    Pt was last seen 05/04/2022 and at that time she was not having any neurological sx.  She was compliant with asa/statin.  Pt returns today for follow up.    Pt denies any amaurosis fugax, speech difficulties, weakness, numbness, paralysis or clumsiness or facial droop.    She is followed by Dr. Anne Fu for bradycardia with AV block.  She was seen on 04/29/2023.  It was felt low HR may be due to sleep disorder and he recommended sleep study and avoid medications that can lower heart rate.   She is recovering from right shoulder surgery about a month ago.  She is doing well overall.    The pt is on a statin for cholesterol management.  The pt is on a daily aspirin.   Other AC:  none The pt is on CCB, ACEI, diuretic for hypertension.   The pt is not on medication for diabetes Tobacco hx:  never  Pt does not have family hx of AAA.  Past Medical History:  Diagnosis Date   Allergy    Arthritis    Carotid artery stenosis 07/03/2015   1-39% bilateral by  dopplers 07/2015   Coronary artery disease    Depression    Essential tremor    Headache    Hyperlipidemia    Hypertension    Hypothyroidism    Osteopenia    PONV (postoperative nausea and vomiting)    Prinzmetal's angina (HCC)    normal coronary arteries with coronary artery vasospasm at time of cath   RBBB    Chronic    Past Surgical History:  Procedure Laterality Date   CARDIAC CATHETERIZATION  01/10/2008   coronary spasm noted on heart cath. No coronary artery disease. LV function normal   CATARACT EXTRACTION W/ INTRAOCULAR LENS IMPLANT Bilateral    colonoscopy  08/09/2005   ganem- 10 Years   CORONARY PRESSURE/FFR STUDY N/A 07/26/2020   Procedure:  INTRAVASCULAR PRESSURE WIRE/FFR STUDY;  Surgeon: Marykay Lex, MD;  Location: [facility] INVASIVE CV LAB;  Service: Cardiovascular;  Laterality: N/A;   KNEE SURGERY     LEFT HEART CATH AND CORONARY ANGIOGRAPHY N/A 07/26/2020   Procedure: LEFT HEART CATH AND CORONARY ANGIOGRAPHY;  Surgeon: Marykay Lex, MD;  Location: [facility] INVASIVE CV LAB;  Service: Cardiovascular;  Laterality: N/A;   REVERSE SHOULDER ARTHROPLASTY Right 04/09/2023   Procedure: REVERSE SHOULDER ARTHROPLASTY;  Surgeon: Beverely Low, MD;  Location: [facility] ORS;  Service: Orthopedics;  Laterality: Right;  interscalene block can we have the knee follow this case   THYROIDECTOMY     TOTAL ABDOMINAL HYSTERECTOMY W/ BILATERAL SALPINGOOPHORECTOMY      Allergies  Allergen Reactions   Elemental Sulfur Other (See Comments)    GI Issues   Fosamax [Alendronate] Other (See Comments)    Jaw pain    Lipitor [Atorvastatin] Other (See Comments)    Myalgias    Pravachol [Pravastatin] Other (See Comments)    Myalgias    Septra [Sulfamethoxazole-Trimethoprim] Other (See Comments)    GI issues   Sulfa Antibiotics Nausea And Vomiting    GI issues   Zetia [Ezetimibe] Other (See Comments)    Myalgias   Crestor [Rosuvastatin] Swelling and Other (  See Comments)    Myalgias     Current Outpatient Medications  Medication Sig Dispense Refill   ALPRAZolam (XANAX) 0.5 MG tablet Take 0.5 mg by mouth 2 (two) times daily as needed. (anxiety)     amLODipine (NORVASC) 2.5 MG tablet Take 1 tablet (2.5 mg total) by mouth daily. 90 tablet 3   aspirin 81 MG tablet Take 81 mg by mouth every evening.     Calcium Carb-Cholecalciferol (CALCIUM + D3 PO) Take 1 tablet by mouth in the morning.     carboxymethylcellulose (REFRESH PLUS) 0.5 % SOLN Place 1 drop into both eyes daily.     cetirizine (ZYRTEC) 10 MG tablet Take 10 mg by mouth in the morning.     ezetimibe (ZETIA) 10 MG tablet Take 1 tablet (10 mg total) by mouth daily. (Patient taking differently: Take 10  mg by mouth every evening.) 90 tablet 3   famotidine (PEPCID) 20 MG tablet Take 20 mg by mouth daily.     levothyroxine (SYNTHROID, LEVOTHROID) 50 MCG tablet Take 50 mcg by mouth daily before breakfast.     lisinopril-hydrochlorothiazide (PRINZIDE,ZESTORETIC) 20-25 MG per tablet Take 1 tablet by mouth in the morning.     Magnesium 250 MG CAPS Take 250 mg by mouth daily.     Multiple Vitamins-Minerals (OCUVITE ADULT 50+ PO) Take 1 tablet by mouth daily.     Omega-3 Fatty Acids (FISH OIL PO) Take 1,200 mg by mouth every evening.     PARoxetine (PAXIL) 20 MG tablet Take 20 mg by mouth daily.     potassium chloride SA (K-DUR,KLOR-CON) 20 MEQ tablet Take 1 tablet (20 mEq total) by mouth 2 (two) times daily. Please make overdue appt with Dr. Mayford Knife before anymore refills. 1st attempt 60 tablet 0   risedronate (ACTONEL) 35 MG tablet Take 35 mg by mouth every Saturday.     rosuvastatin (CRESTOR) 5 MG tablet TAKE 1 TABLET DAILY. (Patient taking differently: Take 5 mg by mouth every evening.) 90 tablet 3   No current facility-administered medications for this visit.    Family History  Problem Relation Age of Onset   Heart disease Mother    Stroke Father    Heart disease Sister    Heart disease Brother    Heart disease Brother     Social History   Socioeconomic History   Marital status: Widowed    Spouse name: Not on file   Number of children: Not on file   Years of education: Not on file   Highest education level: Not on file  Occupational History   Not on file  Tobacco Use   Smoking status: Never   Smokeless tobacco: Never  Vaping Use   Vaping status: Never Used  Substance and Sexual Activity   Alcohol use: No    Alcohol/week: 0.0 standard drinks of alcohol   Drug use: No   Sexual activity: Not on file  Other Topics Concern   Not on file  Social History Narrative   Not on file   Social Drivers of Health   Financial Resource Strain: Low Risk  (07/01/2022)   Received from  [facility], [facility]   Overall Financial Resource Strain (CARDIA)    Difficulty of Paying Living Expenses: Not hard at all  Food Insecurity: No Food Insecurity (07/01/2022)   Received from [facility], [facility]   Hunger Vital Sign    Worried About Running Out of Food in the Last Year: Never true    Ran  Out of Food in the Last Year: Never true  Transportation Needs: No Transportation Needs (07/01/2022)   Received from [facility], [facility]   [facility] - Transportation    Lack of Transportation (Medical): No    Lack of Transportation (Non-Medical): No  Physical Activity: Inactive (07/01/2022)   Received from [facility], [facility]   Exercise Vital Sign    Days of Exercise per Week: 0 days    Minutes of Exercise per Session: 0 min  Stress: No Stress Concern Present (07/01/2022)   Received from [facility] - Occupational Stress Questionnaire    Feeling of Stress : Not at all  Social Connections: Moderately Integrated (07/01/2022)   Received from [facility], [facility]   Social Connection and Isolation Panel [NHANES]    Frequency of Communication with Friends and Family: More than three times a week    Frequency of Social Gatherings with Friends and Family: More than three times a week    Attends Religious Services: More than 4 times per year    Active Member of [employer] or Organizations: Yes    Attends Banker Meetings: More than 4 times per year    Marital Status: Widowed  Intimate Partner Violence: Not At Risk (07/01/2022)   Received from [facility], [facility]   Humiliation, Afraid, Rape, and Kick questionnaire    Fear of Current or Ex-Partner: No    Emotionally Abused: No    Physically Abused: No    Sexually Abused: No     REVIEW OF SYSTEMS:   [X]  denotes positive finding, [ ]  denotes negative finding Cardiac  Comments:  Chest pain or chest pressure:    Shortness of  breath upon exertion:    Short of breath when lying flat:    Irregular heart rhythm:        Vascular    Pain in calf, thigh, or hip brought on by ambulation:    Pain in feet at night that wakes you up from your sleep:     Blood clot in your veins:    Leg swelling:         Pulmonary    Oxygen at home:    Productive cough:     Wheezing:         Neurologic    Sudden weakness in arms or legs:     Sudden numbness in arms or legs:     Sudden onset of difficulty speaking or slurred speech:    Temporary loss of vision in one eye:     Problems with dizziness:         Gastrointestinal    Blood in stool:     Vomited blood:         Genitourinary    Burning when urinating:     Blood in urine:        Psychiatric    Major depression:         Hematologic    Bleeding problems:    Problems with blood clotting too easily:        Skin    Rashes or ulcers:        Constitutional    Fever or chills:      PHYSICAL EXAMINATION:  Today's Vitals   05/10/23 1217  PainSc: 5    There is no height or weight on file to calculate BMI.   General:  WDWN in NAD; vital signs documented above Gait:  Not observed HENT: WNL, normocephalic Pulmonary: normal non-labored breathing Cardiac: regular HR Abdomen: soft, NT; aortic pulse is not palpable Skin: without rashes Vascular Exam/Pulses:  Right Left  Radial 2+ (normal) 2+ (normal)  DP 2+ (normal) 2+ (normal)   Extremities: without open wounds Musculoskeletal: no muscle wasting or atrophy  Neurologic: A&O X 3; moving all extremities equally; speech is fluent/normal Psychiatric:  The pt has Normal affect.   Non-Invasive Vascular Imaging:   Carotid Duplex on 05/10/2023 Right:  1-39% ICA stenosis Left:  1-39% ICA stenosis Vertebrals:  Bilateral vertebral arteries demonstrate antegrade flow.  Subclavians: Normal flow hemodynamics were seen in bilateral subclavian arteries.   Previous Carotid duplex on 05/04/2022: Right: 1-39% ICA  stenosis Left:   40-59% ICA stenosis    ASSESSMENT/PLAN:: 78 y.o. female here for follow up carotid artery stenosis.  She was originally seen by Dr. Arbie Cookey in 2019 after a lifeline screening suggested carotid artery stenosis.    -duplex today reveals bilateral 1-39% ICA stenosis and she remains asymptomatic. -discussed s/s of stroke with pt and she understands should she develop any of these sx, she will go to the nearest ER or call 911. -pt will f/u in one year with carotid duplex -pt will call sooner should she have any issues. -continue statin/asa    Doreatha Massed, [facility] [phone]  Clinic MD:  Myra Gianotti

## 2023-05-15 DIAGNOSIS — Z96611 Presence of right artificial shoulder joint: Secondary | ICD-10-CM | POA: Diagnosis not present

## 2023-05-15 DIAGNOSIS — M542 Cervicalgia: Secondary | ICD-10-CM | POA: Diagnosis not present

## 2023-05-15 DIAGNOSIS — M25511 Pain in right shoulder: Secondary | ICD-10-CM | POA: Diagnosis not present

## 2023-05-24 DIAGNOSIS — M542 Cervicalgia: Secondary | ICD-10-CM | POA: Diagnosis not present

## 2023-05-24 DIAGNOSIS — M25511 Pain in right shoulder: Secondary | ICD-10-CM | POA: Diagnosis not present

## 2023-05-24 DIAGNOSIS — Z96611 Presence of right artificial shoulder joint: Secondary | ICD-10-CM | POA: Diagnosis not present

## 2023-05-26 DIAGNOSIS — Z4789 Encounter for other orthopedic aftercare: Secondary | ICD-10-CM | POA: Diagnosis not present

## 2023-05-26 DIAGNOSIS — M25561 Pain in right knee: Secondary | ICD-10-CM | POA: Diagnosis not present

## 2023-06-02 DIAGNOSIS — M25511 Pain in right shoulder: Secondary | ICD-10-CM | POA: Diagnosis not present

## 2023-06-02 DIAGNOSIS — M542 Cervicalgia: Secondary | ICD-10-CM | POA: Diagnosis not present

## 2023-06-02 DIAGNOSIS — Z4789 Encounter for other orthopedic aftercare: Secondary | ICD-10-CM | POA: Diagnosis not present

## 2023-06-02 DIAGNOSIS — Z96611 Presence of right artificial shoulder joint: Secondary | ICD-10-CM | POA: Diagnosis not present

## 2023-06-07 DIAGNOSIS — M25511 Pain in right shoulder: Secondary | ICD-10-CM | POA: Diagnosis not present

## 2023-06-07 DIAGNOSIS — Z4789 Encounter for other orthopedic aftercare: Secondary | ICD-10-CM | POA: Diagnosis not present

## 2023-06-07 DIAGNOSIS — Z96611 Presence of right artificial shoulder joint: Secondary | ICD-10-CM | POA: Diagnosis not present

## 2023-06-07 DIAGNOSIS — M542 Cervicalgia: Secondary | ICD-10-CM | POA: Diagnosis not present

## 2023-06-14 DIAGNOSIS — Z4789 Encounter for other orthopedic aftercare: Secondary | ICD-10-CM | POA: Diagnosis not present

## 2023-06-14 DIAGNOSIS — M25511 Pain in right shoulder: Secondary | ICD-10-CM | POA: Diagnosis not present

## 2023-06-14 DIAGNOSIS — Z96611 Presence of right artificial shoulder joint: Secondary | ICD-10-CM | POA: Diagnosis not present

## 2023-06-14 DIAGNOSIS — M542 Cervicalgia: Secondary | ICD-10-CM | POA: Diagnosis not present

## 2023-06-16 ENCOUNTER — Telehealth: Payer: Self-pay | Admitting: Cardiology

## 2023-06-16 DIAGNOSIS — M25511 Pain in right shoulder: Secondary | ICD-10-CM | POA: Diagnosis not present

## 2023-06-16 DIAGNOSIS — Z96611 Presence of right artificial shoulder joint: Secondary | ICD-10-CM | POA: Diagnosis not present

## 2023-06-16 DIAGNOSIS — Z4789 Encounter for other orthopedic aftercare: Secondary | ICD-10-CM | POA: Diagnosis not present

## 2023-06-16 DIAGNOSIS — M542 Cervicalgia: Secondary | ICD-10-CM | POA: Diagnosis not present

## 2023-06-16 MED ORDER — EZETIMIBE 10 MG PO TABS: 10.0000 mg | ORAL_TABLET | Freq: Every day | ORAL | 3 refills | Status: DC

## 2023-06-16 NOTE — Telephone Encounter (Signed)
 Pt's medication was sent to pt's pharmacy as requested. Confirmation received.

## 2023-06-16 NOTE — Telephone Encounter (Signed)
*  STAT* If patient is at the pharmacy, call can be transferred to refill team.   1. Which medications need to be refilled? (please list name of each medication and dose if known) need a new prescription for Ezetimibe   2. Would you like to learn more about the convenience, safety, & potential cost savings by using the [facility]?    3. Are you open to using the [facility] (Type [facility]. .   4. Which pharmacy/location (including street and city if local pharmacy) is medication to be sent to?[facility]    5. Do they need a 30 day or 90 day supply? 90 days and refills

## 2023-06-18 ENCOUNTER — Other Ambulatory Visit: Payer: Self-pay | Admitting: Cardiology

## 2023-06-19 ENCOUNTER — Other Ambulatory Visit: Payer: Self-pay | Admitting: Cardiology

## 2023-06-21 DIAGNOSIS — M542 Cervicalgia: Secondary | ICD-10-CM | POA: Diagnosis not present

## 2023-06-21 DIAGNOSIS — Z96611 Presence of right artificial shoulder joint: Secondary | ICD-10-CM | POA: Diagnosis not present

## 2023-06-21 DIAGNOSIS — Z4789 Encounter for other orthopedic aftercare: Secondary | ICD-10-CM | POA: Diagnosis not present

## 2023-06-22 ENCOUNTER — Other Ambulatory Visit: Payer: Self-pay | Admitting: Cardiology

## 2023-06-23 ENCOUNTER — Other Ambulatory Visit: Payer: Self-pay

## 2023-06-23 DIAGNOSIS — M542 Cervicalgia: Secondary | ICD-10-CM | POA: Diagnosis not present

## 2023-06-23 DIAGNOSIS — M25561 Pain in right knee: Secondary | ICD-10-CM | POA: Diagnosis not present

## 2023-06-23 DIAGNOSIS — Z4789 Encounter for other orthopedic aftercare: Secondary | ICD-10-CM | POA: Diagnosis not present

## 2023-06-23 MED ORDER — EZETIMIBE 10 MG PO TABS
10.0000 mg | ORAL_TABLET | Freq: Every day | ORAL | 3 refills | Status: DC
Start: 2023-06-23 — End: 2023-11-11

## 2023-06-28 DIAGNOSIS — Z1231 Encounter for screening mammogram for malignant neoplasm of breast: Secondary | ICD-10-CM | POA: Diagnosis not present

## 2023-07-06 DIAGNOSIS — M542 Cervicalgia: Secondary | ICD-10-CM | POA: Diagnosis not present

## 2023-07-06 DIAGNOSIS — H9201 Otalgia, right ear: Secondary | ICD-10-CM | POA: Diagnosis not present

## 2023-07-06 DIAGNOSIS — F411 Generalized anxiety disorder: Secondary | ICD-10-CM | POA: Diagnosis not present

## 2023-07-06 DIAGNOSIS — I6529 Occlusion and stenosis of unspecified carotid artery: Secondary | ICD-10-CM | POA: Diagnosis not present

## 2023-07-14 DIAGNOSIS — I119 Hypertensive heart disease without heart failure: Secondary | ICD-10-CM | POA: Diagnosis not present

## 2023-07-14 DIAGNOSIS — J309 Allergic rhinitis, unspecified: Secondary | ICD-10-CM | POA: Diagnosis not present

## 2023-07-14 DIAGNOSIS — J988 Other specified respiratory disorders: Secondary | ICD-10-CM | POA: Diagnosis not present

## 2023-07-28 LAB — LAB REPORT - SCANNED
A1c: 6.3
EGFR: 97
TSH: 1.82 (ref 0.41–5.90)

## 2023-08-03 ENCOUNTER — Other Ambulatory Visit: Payer: Self-pay

## 2023-08-03 DIAGNOSIS — N958 Other specified menopausal and perimenopausal disorders: Secondary | ICD-10-CM | POA: Diagnosis not present

## 2023-08-03 DIAGNOSIS — Z8262 Family history of osteoporosis: Secondary | ICD-10-CM | POA: Diagnosis not present

## 2023-08-03 DIAGNOSIS — M8588 Other specified disorders of bone density and structure, other site: Secondary | ICD-10-CM | POA: Diagnosis not present

## 2023-08-03 MED ORDER — AMLODIPINE BESYLATE 2.5 MG PO TABS: 2.5000 mg | ORAL_TABLET | Freq: Every day | ORAL | 2 refills | Status: DC

## 2023-08-04 DIAGNOSIS — Z96611 Presence of right artificial shoulder joint: Secondary | ICD-10-CM | POA: Diagnosis not present

## 2023-08-04 DIAGNOSIS — M542 Cervicalgia: Secondary | ICD-10-CM | POA: Diagnosis not present

## 2023-08-04 DIAGNOSIS — M5030 Other cervical disc degeneration, unspecified cervical region: Secondary | ICD-10-CM | POA: Diagnosis not present

## 2023-08-04 DIAGNOSIS — Z4789 Encounter for other orthopedic aftercare: Secondary | ICD-10-CM | POA: Diagnosis not present

## 2023-08-19 ENCOUNTER — Emergency Department (HOSPITAL_BASED_OUTPATIENT_CLINIC_OR_DEPARTMENT_OTHER)

## 2023-08-19 ENCOUNTER — Other Ambulatory Visit: Payer: Self-pay

## 2023-08-19 ENCOUNTER — Emergency Department (HOSPITAL_BASED_OUTPATIENT_CLINIC_OR_DEPARTMENT_OTHER)
Admission: EM | Admit: 2023-08-19 | Discharge: 2023-08-19 | Disposition: A | Discharge status: Home / Self Care | Attending: Emergency Medicine | Admitting: Emergency Medicine

## 2023-08-19 ENCOUNTER — Encounter (HOSPITAL_BASED_OUTPATIENT_CLINIC_OR_DEPARTMENT_OTHER): Payer: Self-pay | Admitting: Emergency Medicine

## 2023-08-19 DIAGNOSIS — S80211A Abrasion, right knee, initial encounter: Secondary | ICD-10-CM | POA: Diagnosis not present

## 2023-08-19 DIAGNOSIS — S20211A Contusion of right front wall of thorax, initial encounter: Secondary | ICD-10-CM | POA: Diagnosis not present

## 2023-08-19 DIAGNOSIS — Z7982 Long term (current) use of aspirin: Secondary | ICD-10-CM | POA: Diagnosis not present

## 2023-08-19 DIAGNOSIS — R0789 Other chest pain: Secondary | ICD-10-CM | POA: Diagnosis not present

## 2023-08-19 DIAGNOSIS — S299XXA Unspecified injury of thorax, initial encounter: Secondary | ICD-10-CM | POA: Diagnosis present

## 2023-08-19 DIAGNOSIS — Z96611 Presence of right artificial shoulder joint: Secondary | ICD-10-CM | POA: Diagnosis not present

## 2023-08-19 DIAGNOSIS — J9811 Atelectasis: Secondary | ICD-10-CM | POA: Diagnosis not present

## 2023-08-19 DIAGNOSIS — W19XXXA Unspecified fall, initial encounter: Secondary | ICD-10-CM

## 2023-08-19 DIAGNOSIS — S80212A Abrasion, left knee, initial encounter: Secondary | ICD-10-CM | POA: Diagnosis not present

## 2023-08-19 DIAGNOSIS — W108XXA Fall (on) (from) other stairs and steps, initial encounter: Secondary | ICD-10-CM | POA: Insufficient documentation

## 2023-08-19 DIAGNOSIS — Z043 Encounter for examination and observation following other accident: Secondary | ICD-10-CM | POA: Diagnosis not present

## 2023-08-19 DIAGNOSIS — S20219A Contusion of unspecified front wall of thorax, initial encounter: Secondary | ICD-10-CM | POA: Insufficient documentation

## 2023-08-19 DIAGNOSIS — Y9301 Activity, walking, marching and hiking: Secondary | ICD-10-CM | POA: Insufficient documentation

## 2023-08-19 DIAGNOSIS — M1711 Unilateral primary osteoarthritis, right knee: Secondary | ICD-10-CM | POA: Diagnosis not present

## 2023-08-19 DIAGNOSIS — M19011 Primary osteoarthritis, right shoulder: Secondary | ICD-10-CM | POA: Diagnosis not present

## 2023-08-19 DIAGNOSIS — M25561 Pain in right knee: Secondary | ICD-10-CM | POA: Diagnosis not present

## 2023-08-19 MED ORDER — HYDROCODONE-ACETAMINOPHEN 5-325 MG PO TABS
1.0000 | ORAL_TABLET | Freq: Once | ORAL | Status: AC
  Administered 2023-08-19: 1 via ORAL
  Filled 2023-08-19: qty 1

## 2023-08-19 NOTE — Discharge Instructions (Signed)
 You were seen for your fall and chest wall injury in the emergency department.   At home, please take Tylenol  and use lidocaine  patches for your pain.    Check your MyChart online for the results of any tests that had not resulted by the time you left the emergency department.   Follow-up with your primary doctor in 2-3 days regarding your visit.    Return immediately to the emergency department if you experience any of the following: Worsening pain, shortness of breath, or any other concerning symptoms.    Thank you for visiting our Emergency Department. It was a pleasure taking care of you today.

## 2023-08-19 NOTE — ED Provider Notes (Signed)
 [place] [facility] Provider Note   CSN: [id number] Arrival date & time: 08/19/23  1758     Patient presents with: Vanessa Stewart is a 78 y.o. female.   78 year old female with a history of right shoulder surgery on 04/09/2023 who presents to the emergency department after a fall.  Was going up a step and says that she fell forward onto her outstretched wrist.  Unfortunately hit her chest on the way down has been having some sternal pain since then.  No head strike or LOC.  No prolonged downtime.  Also reports that her right shoulder and right knee are hurting.  Has been able to walk around since not on blood thinners for       Prior to Admission medications   Medication Sig Start Date End Date Taking? Authorizing Provider  ALPRAZolam  (XANAX ) 0.5 MG tablet Take 0.5 mg by mouth 2 (two) times daily as needed. (anxiety) Patient not taking: Reported on 05/10/2023 06/10/15   [provider]  amLODipine  (NORVASC ) 2.5 MG tablet Take 1 tablet (2.5 mg total) by mouth daily. 08/03/23   Jeffrie Oneil BROCKS, MD  aspirin  81 MG tablet Take 81 mg by mouth every evening.    [provider]  Calcium  Carb-Cholecalciferol (CALCIUM  + D3 PO) Take 1 tablet by mouth in the morning.    [provider]  carboxymethylcellulose (REFRESH PLUS) 0.5 % SOLN Place 1 drop into both eyes daily.    [provider]  cetirizine (ZYRTEC) 10 MG tablet Take 10 mg by mouth in the morning.    [provider]  ezetimibe  (ZETIA ) 10 MG tablet Take 1 tablet (10 mg total) by mouth daily. 06/23/23   Jeffrie Oneil BROCKS, MD  famotidine (PEPCID) 20 MG tablet Take 20 mg by mouth daily.    [provider]  levothyroxine  (SYNTHROID , LEVOTHROID) 50 MCG tablet Take 50 mcg by mouth daily before breakfast. 07/31/13   [provider]  lisinopril-hydrochlorothiazide (PRINZIDE,ZESTORETIC) 20-25 MG per tablet Take 1 tablet by mouth in the morning.     [provider]  Magnesium 250 MG CAPS Take 250 mg by mouth daily.    [provider]  Multiple Vitamins-Minerals (OCUVITE ADULT 50+ PO) Take 1 tablet by mouth daily.    [provider]  Omega-3 Fatty Acids (FISH OIL PO) Take 1,200 mg by mouth every evening.    [provider]  PARoxetine  (PAXIL ) 20 MG tablet Take 20 mg by mouth daily.    [provider]  potassium chloride  SA (K-DUR,KLOR-CON ) 20 MEQ tablet Take 1 tablet (20 mEq total) by mouth 2 (two) times daily. Please make overdue appt with Dr. Shlomo before anymore refills. 1st attempt 04/05/18   Shlomo Wilbert SAUNDERS, MD  risedronate (ACTONEL) 35 MG tablet Take 35 mg by mouth every Saturday. 07/19/21   [provider]  rosuvastatin  (CRESTOR ) 5 MG tablet TAKE 1 TABLET DAILY. 06/18/23   Jeffrie Oneil BROCKS, MD    Allergies: Elemental sulfur, Fosamax [alendronate], Lipitor [atorvastatin ], Pravachol  [pravastatin ], Septra [sulfamethoxazole-trimethoprim], Sulfa antibiotics, Zetia  [ezetimibe ], and Crestor  [rosuvastatin ]    Review of Systems  Updated Vital Signs BP (!) 151/76 (BP Location: Right Arm)   Pulse 72   Temp 98 F (36.7 C) (Oral)   Resp 15   Ht 5' 1 (1.549 m)   Wt 63.5 kg   SpO2 100%   BMI 26.45 kg/m   Physical Exam Vitals and nursing note reviewed.  Constitutional:  General: She is not in acute distress.    Appearance: She is well-developed.  HENT:     Head: Normocephalic and atraumatic.     Right Ear: External ear normal.     Left Ear: External ear normal.     Nose: Nose normal.  Eyes:     Extraocular Movements: Extraocular movements intact.     Conjunctiva/sclera: Conjunctivae normal.     Pupils: Pupils are equal, round, and reactive to light.  Neck:     Comments: No C-spine midline tenderness to palpation Cardiovascular:     Rate and Rhythm: Normal rate and regular rhythm.     Heart sounds: No murmur heard.    Comments: Chest pain reproducible on the lower  sternum. Pulmonary:     Effort: Pulmonary effort is normal. No respiratory distress.     Breath sounds: Normal breath sounds.  Musculoskeletal:     Cervical back: Normal range of motion and neck supple.     Right lower leg: No edema.     Left lower leg: No edema.     Comments: No tenderness palpation along right clavicle or right proximal humerus.  No tenderness to palpation of bilateral knees but does have abrasions on bilateral knees.  No tenderness palpation of bilateral elbows or wrist including snuffbox is bilaterally.  Skin:    General: Skin is warm and dry.  Neurological:     Mental Status: She is alert and oriented to person, place, and time. Mental status is at baseline.  Psychiatric:        Mood and Affect: Mood normal.     (all labs ordered are listed, but only abnormal results are displayed) Labs Reviewed - No data to display  EKG: EKG Interpretation Date/Time:  Thursday August 19 2023 18:31:13 EDT Ventricular Rate:  84 PR Interval:  163 QRS Duration:  145 QT Interval:  410 QTC Calculation: 485 R Axis:   0  Text Interpretation: Sinus rhythm Ventricular premature complex Probable left atrial enlargement Right bundle branch block LVH with secondary repolarization abnormality Confirmed by Yolande Charleston [phone]) on 08/19/2023 9:13:02 PM  Radiology: [facility] Knee Complete 4 Views Right Result Date: 08/19/2023 CLINICAL DATA:  Patient fell on steps.  Right knee pain. EXAM: RIGHT KNEE - COMPLETE 4+ VIEW COMPARISON:  None Available. FINDINGS: Degenerative changes in the right knee with medial compartment narrowing and osteophyte formation. Small subcortical lucencies in the medial compartment suggesting osteochondral defects. No evidence of acute fracture or dislocation. No focal bone lesion or bone destruction. No significant effusions. Soft tissues are unremarkable. IMPRESSION: Moderate degenerative changes in the right knee medial compartment. No acute bony abnormalities.  Electronically Signed   By: Elsie Gravely M.D.   On: 08/19/2023 19:33   DG Shoulder Right Result Date: 08/19/2023 CLINICAL DATA:  Fall. EXAM: RIGHT SHOULDER - 2+ VIEW COMPARISON:  04/09/2023. FINDINGS: No acute fracture or dislocation. No aggressive osseous lesion. Glenohumeral and acromioclavicular joints are normal in alignment. Mild osteoarthritis of the acromioclavicular joint. Redemonstration of right reverse shoulder arthroplasty. No soft tissue swelling. No radiopaque foreign bodies. IMPRESSION: No acute osseous abnormality of the right shoulder. Electronically Signed   By: Ree Molt M.D.   On: 08/19/2023 19:19   DG Chest 2 View Result Date: 08/19/2023 CLINICAL DATA:  Fall; chest wall pain.  Chest pain/sternum pain. EXAM: CHEST - 2 VIEW; STERNUM - 2+ VIEW COMPARISON:  08/10/2021. FINDINGS: Linear area of scarring/atelectasis noted at the left lung base. Bilateral lung fields are otherwise clear. Bilateral  costophrenic angles are clear. Stable cardio-mediastinal silhouette. No acute osseous abnormalities. No acute displaced sternal fracture. Right reverse shoulder arthroplasty noted. The soft tissues are within normal limits. There are surgical staples overlying the left paramedian lower neck. IMPRESSION: *No active cardiopulmonary disease. No acute displaced sternal fracture. Electronically Signed   By: Ree Molt M.D.   On: 08/19/2023 19:17   DG Sternum Result Date: 08/19/2023 CLINICAL DATA:  Fall; chest wall pain.  Chest pain/sternum pain. EXAM: CHEST - 2 VIEW; STERNUM - 2+ VIEW COMPARISON:  08/10/2021. FINDINGS: Linear area of scarring/atelectasis noted at the left lung base. Bilateral lung fields are otherwise clear. Bilateral costophrenic angles are clear. Stable cardio-mediastinal silhouette. No acute osseous abnormalities. No acute displaced sternal fracture. Right reverse shoulder arthroplasty noted. The soft tissues are within normal limits. There are surgical staples overlying  the left paramedian lower neck. IMPRESSION: *No active cardiopulmonary disease. No acute displaced sternal fracture. Electronically Signed   By: Ree Molt M.D.   On: 08/19/2023 19:17     Procedures   Medications Ordered in the ED  HYDROcodone -acetaminophen  (NORCO/VICODIN) 5-325 MG per tablet 1 tablet (1 tablet Oral Given 08/19/23 1828)                                    Medical Decision Making Amount and/or Complexity of Data Reviewed Radiology: ordered.  Risk Prescription drug management.   78 year old female with a history of right shoulder surgery in February of this year presenting to the emergency department chest pain after a fall  Initial Ddx:  Chest pain, sternal fracture, rib fracture, MI, shoulder injury, knee fracture  MDM/Course:  Patient presents emergency department after mechanical fall.  No head strike or LOC.  She is having some chest pain where she struck her chest when she fell.  Is very clear that she did not have any chest pain for her so do not feel that MI is likely.  EKG also now reflective of MI she underwent x-rays including a sternal x-ray which did not show evidence of fracture.  Her shoulder and knee did not reveal evidence of fracture either.  Upon re-evaluation feeling better after the Norco.  Will have her follow-up with her primary doctor in several days regarding her symptoms  This patient presents to the ED for concern of complaints listed in HPI, this involves an extensive number of treatment options, and is a complaint that carries with it a high risk of complications and morbidity. Disposition including potential need for admission considered.   Dispo: DC Home. Return precautions discussed including, but not limited to, those listed in the AVS. Allowed pt time to ask questions which were answered fully prior to dc.  Records reviewed Outpatient Clinic Notes I independently reviewed the following imaging with scope of interpretation limited  to determining acute life threatening conditions related to emergency care: Chest x-ray and agree with the radiologist interpretation with the following exceptions: none I personally reviewed and interpreted the pt's EKG: see above for interpretation  I have reviewed the patients home medications and made adjustments as needed Social Determinants of health:  Geriatric  Portions of this note were generated with Scientist, clinical (histocompatibility and immunogenetics). Dictation errors may occur despite best attempts at proofreading.     Final diagnoses:  Fall, initial encounter  Contusion of chest wall, unspecified laterality, initial encounter    ED Discharge Orders     None  Yolande Lamar BROCKS, MD 08/19/23 2113

## 2023-08-19 NOTE — ED Notes (Signed)
 Patient transported to radiology

## 2023-08-19 NOTE — ED Triage Notes (Addendum)
 Pt reports mechanical fall up one stair onto the porch, hit chest, chin and RT knee, reports recent RT shoulder sx on 08/07/23, concerned for damage to shoulder

## 2023-08-23 DIAGNOSIS — Z96611 Presence of right artificial shoulder joint: Secondary | ICD-10-CM | POA: Diagnosis not present

## 2023-08-23 DIAGNOSIS — M542 Cervicalgia: Secondary | ICD-10-CM | POA: Diagnosis not present

## 2023-08-23 DIAGNOSIS — M5030 Other cervical disc degeneration, unspecified cervical region: Secondary | ICD-10-CM | POA: Diagnosis not present

## 2023-08-23 DIAGNOSIS — Z4789 Encounter for other orthopedic aftercare: Secondary | ICD-10-CM | POA: Diagnosis not present

## 2023-08-24 DIAGNOSIS — R0781 Pleurodynia: Secondary | ICD-10-CM | POA: Diagnosis not present

## 2023-08-27 ENCOUNTER — Encounter: Payer: Self-pay | Admitting: Advanced Practice Midwife

## 2023-08-31 DIAGNOSIS — Z96611 Presence of right artificial shoulder joint: Secondary | ICD-10-CM | POA: Diagnosis not present

## 2023-08-31 DIAGNOSIS — M1711 Unilateral primary osteoarthritis, right knee: Secondary | ICD-10-CM | POA: Diagnosis not present

## 2023-09-01 DIAGNOSIS — R0789 Other chest pain: Secondary | ICD-10-CM | POA: Diagnosis not present

## 2023-09-10 ENCOUNTER — Encounter: Payer: Self-pay | Admitting: Internal Medicine

## 2023-09-10 ENCOUNTER — Other Ambulatory Visit: Payer: Self-pay

## 2023-09-10 ENCOUNTER — Ambulatory Visit: Payer: Self-pay | Admitting: Internal Medicine

## 2023-09-10 DIAGNOSIS — H6991 Unspecified Eustachian tube disorder, right ear: Secondary | ICD-10-CM | POA: Diagnosis not present

## 2023-09-10 DIAGNOSIS — J3089 Other allergic rhinitis: Secondary | ICD-10-CM

## 2023-09-10 DIAGNOSIS — J328 Other chronic sinusitis: Secondary | ICD-10-CM

## 2023-09-10 MED ORDER — AMOXICILLIN-POT CLAVULANATE 875-125 MG PO TABS
1.0000 | ORAL_TABLET | Freq: Two times a day (BID) | ORAL | 0 refills | Status: AC
Start: 2023-09-10 — End: 2023-09-20

## 2023-09-10 MED ORDER — PREDNISONE 10 MG PO TABS: ORAL_TABLET | ORAL | 0 refills | Status: AC

## 2023-09-10 NOTE — Patient Instructions (Addendum)
 Chronic right-sided otitis media and chronic rhinosinusitis  Chronic right-sided otitis media with facial pain and pressure, likely due to chronic inflammation and possible infection. Scarring on the right eardrum. Allergic rhinitis with nasal congestion and sneezing, exacerbated by environmental exposures. Previous allergy shots showed some improvement. No current symptoms of itchy or watery eyes - Prescribe Augmentin 875/125 mg twice daily for 10 days  - Prednisone 10mg  : Take 2 tablets twice a day for 3 more days, Then take 2 tablets once a day for 1 day., then take 1 tablet once a day for 1 day.  - Refer to ENT if symptoms improve with treatment due to concern for chronic sinusitis  - Return for allergy testing (1-55) -hold all antihistamines for 3 days prior . - Consider restarting allergy injections if testing is positive and symptoms correlate with triggers  Follow up: August 18 at 9:00 for allergy skin testing (1-55).  Hold all antihistamines for 3 days prior  Thank you so much for letting me partake in your care today.  Don't hesitate to reach out if you have any additional concerns!  Hargis Springer, MD  [facility] [place] [place] Right TM with scarring

## 2023-09-10 NOTE — Progress Notes (Signed)
 NEW PATIENT Date of Service/Encounter:  09/10/23 Referring provider: Loreli Kins, MD Primary care provider: Loreli Kins, MD  Subjective:  Vanessa Stewart is a 78 y.o. female  presenting today for evaluation of right sided congestion and ear fullness History obtained from: chart review and patient.   Discussed the use of AI scribe software for clinical note transcription with the patient, who gave verbal consent to proceed.  History of Present Illness Vanessa Stewart is a 78 year old female who presents with chronic right-sided ear congestion and facial pain.  Chronic right-sided ear congestion and facial pain - Chronic right-sided ear congestion and facial pain with constant ache inside the head - Pain not worsened by attempts to pop the ear or by bending over - Application of heat, such as a heating pad, sometimes provides relief - Sensation of muffled hearing and feeling as though 'in a tunnel' when speaking - Symptoms have persisted despite treatments including sinus rinses and nasal sprays (prescribed and  OTC cannot recall with ones) - No chest pain, shortness of breath, or palpitations - Denies any sneezing or ocular symptoms  Otologic and sinus history - History of otitis externa diagnosed in December, treated with antibiotic ear drops with partial relief - Multiple prior tympanostomy tube placements - CT scan in July 2022 showed mild sinus disease - MRI in January 2024 showed fluid in the right mastoid air cells - Has followed with Dr. Georgina ENT for a long time  Allergic and environmental triggers - Symptoms exacerbated by exposure to weeds, trees, and grass, particularly in the summer - No sneezing, itchy, or watery eyes, but experiences pressure and occasional sneezing - No known food allergies - No history of asthma, eczema, or random hives and swelling - Underwent allergy shots approximately 30 years ago with partial benefit at that time, unclear how long  she was on injections for   - Previous long courses of antibiotics and prednisone when starting allergy shots  -unclear if there was a benefit  - No known antibiotic allergies    Chart Review:  ENT 01/27/23: Acute diffuse otitis externa of right ear - Primary  Relevant Medications  neomycin-polymyxin-HC (CORTISPORIN) 3.5-10,000-1 mg/mL-unit/mL-% otic solution  Right ear discomfort. Chronic on and off problem with acute exacerbation. Has not used any meds recently. EXAM shows a lot of squamous debris filling the right external canal. With much difficulty this was removed as she was markedly tender. Tympanic membrane looks intact. Canal is moderately excoriated. PLAN: Seems like an otitis externa issue. Will treat with topical drops. Call if not improving. Otherwise follow-up as needed   08/22/20: IMPRESSION:  Opacification of the diminutive left frontal sinus. Mucosal  thickening of the right frontal ethmoid junction. Few opacified  anterior ethmoid air cells.   Mild mucosal thickening of the right maxillary sinus. Ostiomeatal  units appear widely patent.   Enlarged nasolacrimal duct on the left, possibly with some adjacent  inflammatory change, suggesting dacrocystitis.   Other allergy screening: Asthma: no Rhino conjunctivitis: yes Food allergy: no Medication allergy: no Hymenoptera allergy: no Urticaria: no Eczema:no History of recurrent infections suggestive of immunodeficency: no Vaccinations are up to date.   Past Medical History: Past Medical History:  Diagnosis Date   Allergy    Arthritis    Carotid artery stenosis 07/03/2015   1-39% bilateral by  dopplers 07/2015   Coronary artery disease    Depression    Essential tremor    Headache    Hyperlipidemia  Hypertension    Hypothyroidism    Osteopenia    PONV (postoperative nausea and vomiting)    Prinzmetal's angina (HCC)    normal coronary arteries with coronary artery vasospasm at time of cath   RBBB     Chronic   Medication List:  Current Outpatient Medications  Medication Sig Dispense Refill   ALPRAZolam  (XANAX ) 0.5 MG tablet Take 0.5 mg by mouth 2 (two) times daily as needed. (anxiety)     amLODipine  (NORVASC ) 2.5 MG tablet Take 1 tablet (2.5 mg total) by mouth daily. 90 tablet 2   amoxicillin -clavulanate (AUGMENTIN) 875-125 MG tablet Take 1 tablet by mouth 2 (two) times daily for 10 days. 20 tablet 0   aspirin  81 MG tablet Take 81 mg by mouth every evening.     Calcium  Carb-Cholecalciferol (CALCIUM  + D3 PO) Take 1 tablet by mouth in the morning.     carboxymethylcellulose (REFRESH PLUS) 0.5 % SOLN Place 1 drop into both eyes daily.     cetirizine (ZYRTEC) 10 MG tablet Take 10 mg by mouth in the morning.     ezetimibe  (ZETIA ) 10 MG tablet Take 1 tablet (10 mg total) by mouth daily. 90 tablet 3   famotidine (PEPCID) 20 MG tablet Take 20 mg by mouth daily.     levothyroxine  (SYNTHROID , LEVOTHROID) 50 MCG tablet Take 50 mcg by mouth daily before breakfast.     lisinopril-hydrochlorothiazide (PRINZIDE,ZESTORETIC) 20-25 MG per tablet Take 1 tablet by mouth in the morning.     Magnesium 250 MG CAPS Take 250 mg by mouth daily.     Multiple Vitamins-Minerals (OCUVITE ADULT 50+ PO) Take 1 tablet by mouth daily.     Omega-3 Fatty Acids (FISH OIL PO) Take 1,200 mg by mouth every evening.     PARoxetine  (PAXIL ) 20 MG tablet Take 20 mg by mouth daily.     potassium chloride  SA (K-DUR,KLOR-CON ) 20 MEQ tablet Take 1 tablet (20 mEq total) by mouth 2 (two) times daily. Please make overdue appt with Dr. Shlomo before anymore refills. 1st attempt 60 tablet 0   predniSONE (DELTASONE) 10 MG tablet Prednisone 10mg  : Take 2 tablets twice a day for 3 more days, Then take 2 tablets once a day for 1 day., then take 1 tablet once a day for 1 day. 15 tablet 0   risedronate (ACTONEL) 35 MG tablet Take 35 mg by mouth every Saturday.     rosuvastatin  (CRESTOR ) 5 MG tablet TAKE 1 TABLET DAILY. 90 tablet 3   No  current facility-administered medications for this visit.   Known Allergies:  Allergies  Allergen Reactions   Elemental Sulfur Other (See Comments)    GI Issues   Fosamax [Alendronate] Other (See Comments)    Jaw pain    Lipitor [Atorvastatin ] Other (See Comments)    Myalgias    Pravachol  [Pravastatin ] Other (See Comments)    Myalgias    Septra [Sulfamethoxazole-Trimethoprim] Other (See Comments)    GI issues   Sulfa Antibiotics Nausea And Vomiting    GI issues   Zetia  [Ezetimibe ] Other (See Comments)    Myalgias   Crestor  [Rosuvastatin ] Swelling and Other (See Comments)    Myalgias    Past Surgical History: Past Surgical History:  Procedure Laterality Date   CARDIAC CATHETERIZATION  01/10/2008   coronary spasm noted on heart cath. No coronary artery disease. LV function normal   CATARACT EXTRACTION W/ INTRAOCULAR LENS IMPLANT Bilateral    colonoscopy  08/09/2005   ganem- 10 Years  CORONARY PRESSURE/FFR STUDY N/A 07/26/2020   Procedure: INTRAVASCULAR PRESSURE WIRE/FFR STUDY;  Surgeon: Anner Alm ORN, MD;  Location: [facility] INVASIVE CV LAB;  Service: Cardiovascular;  Laterality: N/A;   KNEE SURGERY     LEFT HEART CATH AND CORONARY ANGIOGRAPHY N/A 07/26/2020   Procedure: LEFT HEART CATH AND CORONARY ANGIOGRAPHY;  Surgeon: Anner Alm ORN, MD;  Location: [facility] INVASIVE CV LAB;  Service: Cardiovascular;  Laterality: N/A;   REVERSE SHOULDER ARTHROPLASTY Right 04/09/2023   Procedure: REVERSE SHOULDER ARTHROPLASTY;  Surgeon: Kay Kemps, MD;  Location: [facility];  Service: Orthopedics;  Laterality: Right;  interscalene block can we have the knee follow this case   THYROIDECTOMY     TOTAL ABDOMINAL HYSTERECTOMY W/ BILATERAL SALPINGOOPHORECTOMY     TYMPANOSTOMY TUBE PLACEMENT     Family History: Family History  Problem Relation Age of Onset   Heart disease Mother    Stroke Father    Allergic rhinitis Sister    Heart disease Sister    Heart disease Brother    Heart disease  Brother    Social History: Chirstine lives single-family home that is 77 years old.  No water  damage.  Carpet throughout.  Heat pump for heating and cooling.  No pets.  No roaches in the house and bed is to be off the floor.  Has mattress covers on bed and dust mite covers on pillows.  No tobacco exposure.  Is retired.   ROS:  All other systems negative except as noted per HPI.  Objective:  There were no vitals taken for this visit. There is no height or weight on file to calculate BMI. Physical Exam:  General Appearance:  Alert, cooperative, no distress, appears stated age  Head:  Normocephalic, without obvious abnormality, atraumatic  Eyes:  Conjunctiva clear, EOM's intact  Ears Right TM with scarring and serous effusion and EACs normal bilaterally  Nose: Nares normal, edematous nasal mucosa on the right, normal on left, no visible anterior polyps, and septum midline  Throat: Lips, tongue normal; teeth and gums normal, normal posterior oropharynx  Neck: Supple, symmetrical  Lungs:   clear to auscultation bilaterally, Respirations unlabored, no coughing  Heart:  regular rate and rhythm and no murmur, Appears well perfused  Extremities: No edema  Skin: Skin color, texture, turgor normal and no rashes or lesions on visualized portions of skin  Neurologic: No gross deficits   Diagnostics: None done    Labs:  Lab Orders  No laboratory test(s) ordered today     Assessment and Plan  Assessment and Plan Assessment & Plan Chronic right-sided otitis media and chronic rhinosinusitis  Chronic right-sided otitis media with facial pain and pressure, likely due to chronic inflammation and possible infection. Scarring on the right eardrum. Allergic rhinitis with nasal congestion and sneezing, exacerbated by environmental exposures. Previous allergy shots showed some improvement. No current symptoms of itchy or watery eyes.  - Prescribe Augmentin 875/125 mg twice daily for 10 days  -  Prednisone 10mg  : Take 2 tablets twice a day for 3 more days, Then take 2 tablets once a day for 1 day., then take 1 tablet once a day for 1 day.  - Refer to ENT if symptoms improve with treatment due to concern for chronic sinusitis  - Return for allergy testing (1-55) -hold all antihistamines for 3 days prior . - Consider restarting allergy injections if testing is positive and symptoms correlate with triggers  Follow up:  allergy skin testing (1-55).  Hold all antihistamines for  3 days prior      This note in its entirety was forwarded to the Provider who requested this consultation.  Other:    Thank you for your kind referral. I appreciate the opportunity to take part in Amybeth's care. Please do not hesitate to contact me with questions.  Sincerely,  Thank you so much for letting me partake in your care today.  Don't hesitate to reach out if you have any additional concerns!  Hargis Springer, MD  [facility] [place], [place]

## 2023-09-11 ENCOUNTER — Other Ambulatory Visit: Payer: Self-pay | Admitting: Allergy

## 2023-09-11 NOTE — Progress Notes (Signed)
 Called by pt.  She had visit yesterday 09/10/23 by Dr Lorin.  Pt states she was prescribed prednisone  for her symptoms.  She has take 4 prednisone  pills since the visit and she called this morning to ask if she could discontinue prednisone .  She states she is red especially in the face and states heart has been racing.  She has had issues with prednisone  in the past too.   Before I could call her back she states she talked to her PCP  on call who recommended she just not take any more prednisone .   I am in agreement and said to stop as well.

## 2023-09-13 DIAGNOSIS — M5030 Other cervical disc degeneration, unspecified cervical region: Secondary | ICD-10-CM | POA: Diagnosis not present

## 2023-09-13 DIAGNOSIS — Z96611 Presence of right artificial shoulder joint: Secondary | ICD-10-CM | POA: Diagnosis not present

## 2023-09-13 DIAGNOSIS — Z4789 Encounter for other orthopedic aftercare: Secondary | ICD-10-CM | POA: Diagnosis not present

## 2023-09-13 DIAGNOSIS — M542 Cervicalgia: Secondary | ICD-10-CM | POA: Diagnosis not present

## 2023-09-13 NOTE — Progress Notes (Signed)
 Thanks for the update

## 2023-09-15 ENCOUNTER — Telehealth: Payer: Self-pay | Admitting: Internal Medicine

## 2023-09-15 NOTE — Telephone Encounter (Signed)
 Pt states she took predisone and had a reaction, she was told to stop by Pcp and Dr. Jeneal agreed. Pt would like a call back as to next steps.

## 2023-09-17 NOTE — Telephone Encounter (Signed)
 Plan has not changed significantly.  She should continue her antibiotics as prescribed and follow-up for skin testing to evaluate for allergic component.

## 2023-09-20 NOTE — Telephone Encounter (Signed)
 Patient called and wanted to know what are the plans for her going forward. I read her the notes from Dr Lorin and she was very thankful. Patient said she didn't want to mean or ugly but she is very upset that it took so long for her to get that message that she should continue her antibiotics and stop the prednisone . I apologized for any inconvenience to her and let her know the steps going forward.

## 2023-09-21 NOTE — Telephone Encounter (Signed)
 The plan remains what spoke about in the clinic.  She needs allergy testing as the next step.  If she responds to antibiotics we can send her to ENT for evaluation of chronic sinusitis based on previous CT.  If she continues to have questions about this plan, recommend setting up a follow up appointment to discuss her concerns in depth in clinic.

## 2023-09-27 DIAGNOSIS — Z96611 Presence of right artificial shoulder joint: Secondary | ICD-10-CM | POA: Diagnosis not present

## 2023-09-27 DIAGNOSIS — M5030 Other cervical disc degeneration, unspecified cervical region: Secondary | ICD-10-CM | POA: Diagnosis not present

## 2023-09-27 DIAGNOSIS — M542 Cervicalgia: Secondary | ICD-10-CM | POA: Diagnosis not present

## 2023-09-27 DIAGNOSIS — Z4789 Encounter for other orthopedic aftercare: Secondary | ICD-10-CM | POA: Diagnosis not present

## 2023-09-30 ENCOUNTER — Telehealth: Payer: Self-pay

## 2023-09-30 NOTE — Telephone Encounter (Signed)
   Pre-operative Risk Assessment    Patient Name: Vanessa Stewart  DOB: [birth year] MRN: [id number]   Date of last office visit: 04/29/23 Date of next office visit: 11/01/23   Request for Surgical Clearance    Procedure:  Right total knee arthroplasty   Date of Surgery:  Clearance TBD                                 Surgeon:  Dr. Elspeth Her  Surgeon's Group or Practice Name:  [facility]  Phone number:  [phone] Fax number:  [phone]   Type of Clearance Requested:   - Medical  - Pharmacy:  Hold Aspirin  Not indicated    Type of Anesthesia:  Choice    Additional requests/questions:    SignedRebeca Blight   09/30/2023, 3:08 PM

## 2023-10-01 NOTE — Telephone Encounter (Signed)
 Called patient no answer left a vm to inform her that we will do her preop on her visit with Tessa on 09/22.

## 2023-10-01 NOTE — Telephone Encounter (Signed)
   Name: Vanessa Stewart  DOB: 1945/10/06  MRN: [id number]  Primary Cardiologist: Oneil Parchment, MD  Chart reviewed as part of pre-operative protocol coverage. The patient has an upcoming visit scheduled with Orren Fabry  on 11/01/2023 at which time clearance can be addressed in case there are any issues that would impact surgical recommendations.   Right total knee arthroplasty Is not scheduled until TBD as below. I added preop FYI to appointment note so that provider is aware to address at time of outpatient visit.  Per office protocol the cardiology provider should forward their finalized clearance decision and recommendations regarding antiplatelet therapy to the requesting party below.    I will route this message as FYI to requesting party and remove this message from the preop box as separate preop APP input not needed at this time.   Please call with any questions.  Lamarr Satterfield, NP  10/01/2023, 7:40 AM

## 2023-10-06 ENCOUNTER — Telehealth: Payer: Self-pay | Admitting: Cardiology

## 2023-10-06 DIAGNOSIS — Z96611 Presence of right artificial shoulder joint: Secondary | ICD-10-CM | POA: Diagnosis not present

## 2023-10-06 NOTE — Telephone Encounter (Signed)
  Pt c/o medication issue:  1. Name of Medication: Clindamycin  2. How are you currently taking this medication (dosage and times per day)? Only has to take 2 times prior to dental procedure  3. Are you having a reaction (difficulty breathing--STAT)? Yes, low HR.   4. What is your medication issue? Patient needs to take this medication to get a crown. She only has to take it 2x and took one yesterday and her HR went down to 41. She wants to know if it is safe to take today. She says that her HR has dropped a little bit today as well but has not taken the medication today. While on the phone her HR was 100 but she states she had just walked in the house from being outside. She said the same thing has happened when she had to take amoxicillin .

## 2023-10-06 NOTE — Telephone Encounter (Signed)
 Spoke to patient she stated she had a sinus infection 2 months ago she took Amoxicillin  for 4 days caused her heart rate to drop to 42.She is concerned she is suppose to take Clindamycin for a upcoming dental work.She wanted to ask Dr.Skains if ok to take.Message sent to Dr.Skains for advice.

## 2023-10-08 ENCOUNTER — Telehealth: Payer: Self-pay | Admitting: Cardiology

## 2023-10-08 NOTE — Telephone Encounter (Signed)
 Spoke with pt who is very anxious about her heart rate and the fact her watch alerted her yesterday, while she was sleeping, that her HR had dropped to 38 bpm.  She did not have any s/s as she was asleep.  She reports feeling more nervous because her watch notifies her of her rate.  She denies feeling any irregularities in heart beat.   BP yesterday was 137/70 per her report.  Advised it is not abnormal for HR to decrease during rest/sleep.  Advised to continue to monitor.  If heart is consistently low and she is having symptoms, she should call 911 to be taken to closest ED for further evaluation.  Pt states understanding and is appreciative of the call and information.  Will forward to Dr Jeffrie for his knowledge and any further orders.

## 2023-10-08 NOTE — Telephone Encounter (Signed)
 STAT if HR is under 50 or over 120 (normal HR is 60-100 beats per minute)  What is your heart rate?   40-80 and back down  Do you have a log of your heart rate readings (document readings)?   No - watches it on phone  Do you have any other symptoms? No   Patient is concerned regarding her HR has been going up and down

## 2023-10-12 NOTE — Telephone Encounter (Signed)
 Spoke to patient Dr.Skains advice given.

## 2023-10-13 DIAGNOSIS — M5030 Other cervical disc degeneration, unspecified cervical region: Secondary | ICD-10-CM | POA: Diagnosis not present

## 2023-10-13 DIAGNOSIS — M542 Cervicalgia: Secondary | ICD-10-CM | POA: Diagnosis not present

## 2023-10-13 DIAGNOSIS — Z4789 Encounter for other orthopedic aftercare: Secondary | ICD-10-CM | POA: Diagnosis not present

## 2023-10-13 DIAGNOSIS — Z96611 Presence of right artificial shoulder joint: Secondary | ICD-10-CM | POA: Diagnosis not present

## 2023-10-15 ENCOUNTER — Ambulatory Visit: Admitting: Internal Medicine

## 2023-10-30 DIAGNOSIS — H43393 Other vitreous opacities, bilateral: Secondary | ICD-10-CM | POA: Diagnosis not present

## 2023-10-30 DIAGNOSIS — H353113 Nonexudative age-related macular degeneration, right eye, advanced atrophic without subfoveal involvement: Secondary | ICD-10-CM | POA: Diagnosis not present

## 2023-10-30 DIAGNOSIS — H04123 Dry eye syndrome of bilateral lacrimal glands: Secondary | ICD-10-CM | POA: Diagnosis not present

## 2023-11-01 ENCOUNTER — Ambulatory Visit: Admitting: Physician Assistant

## 2023-11-01 DIAGNOSIS — M5030 Other cervical disc degeneration, unspecified cervical region: Secondary | ICD-10-CM | POA: Diagnosis not present

## 2023-11-01 DIAGNOSIS — Z96611 Presence of right artificial shoulder joint: Secondary | ICD-10-CM | POA: Diagnosis not present

## 2023-11-01 DIAGNOSIS — Z4789 Encounter for other orthopedic aftercare: Secondary | ICD-10-CM | POA: Diagnosis not present

## 2023-11-01 DIAGNOSIS — M542 Cervicalgia: Secondary | ICD-10-CM | POA: Diagnosis not present

## 2023-11-11 ENCOUNTER — Ambulatory Visit: Attending: Physician Assistant | Admitting: Physician Assistant

## 2023-11-11 ENCOUNTER — Encounter: Payer: Self-pay | Admitting: Physician Assistant

## 2023-11-11 VITALS — BP 132/84 | HR 81 | Ht 62.0 in | Wt 142.8 lb

## 2023-11-11 DIAGNOSIS — E785 Hyperlipidemia, unspecified: Secondary | ICD-10-CM

## 2023-11-11 DIAGNOSIS — I2510 Atherosclerotic heart disease of native coronary artery without angina pectoris: Secondary | ICD-10-CM | POA: Diagnosis not present

## 2023-11-11 DIAGNOSIS — I441 Atrioventricular block, second degree: Secondary | ICD-10-CM | POA: Diagnosis not present

## 2023-11-11 DIAGNOSIS — I10 Essential (primary) hypertension: Secondary | ICD-10-CM | POA: Diagnosis not present

## 2023-11-11 DIAGNOSIS — G4719 Other hypersomnia: Secondary | ICD-10-CM | POA: Diagnosis not present

## 2023-11-11 DIAGNOSIS — R001 Bradycardia, unspecified: Secondary | ICD-10-CM | POA: Diagnosis not present

## 2023-11-11 DIAGNOSIS — R0683 Snoring: Secondary | ICD-10-CM

## 2023-11-11 MED ORDER — AMLODIPINE BESYLATE 2.5 MG PO TABS: 2.5000 mg | ORAL_TABLET | Freq: Every day | ORAL | 2 refills | Status: DC

## 2023-11-11 MED ORDER — ROSUVASTATIN CALCIUM 5 MG PO TABS: 5.0000 mg | ORAL_TABLET | Freq: Every day | ORAL | 3 refills | Status: DC

## 2023-11-11 MED ORDER — LISINOPRIL-HYDROCHLOROTHIAZIDE 20-25 MG PO TABS: 1.0000 | ORAL_TABLET | Freq: Every morning | ORAL | 1 refills | Status: DC

## 2023-11-11 MED ORDER — EZETIMIBE 10 MG PO TABS: 10.0000 mg | ORAL_TABLET | Freq: Every day | ORAL | 3 refills | Status: DC

## 2023-11-11 NOTE — Progress Notes (Signed)
 Cardiology Office Note   Date:  11/11/2023  ID:  Vanessa Stewart, DOB 08-26-1945, MRN [id number] PCP: Loreli Kins, MD  [facility] Providers Cardiologist:  Oneil Parchment, MD   History of Present Illness Vanessa Stewart is a 78 y.o. female with a past medical history of bradycardia and AV block who presented with tachycardia back in March.  When she was seen by Dr. Parchment April 29, 2023 she was experiencing heart rates between 101-110 bpm accompanied by sensation of her heart racing, nervousness, and GI discomfort.  No chest pain or shortness of breath or leg swelling.  Underwent orthopedic surgery 3 weeks prior and she felt she had a heightened awareness of heart rate changes.  Recent alert from her Apple Watch indicated a drop in her heart rate down to 39 bpm while laying down.  Has history of bradycardia and AV block which complicates medical management.  Previous EKGs have shown a variety of heart rates including heart rate of 41 bpm and second-degree heart block with 2-1 AV conduction.  August 2022 her EKG showed a heart rate of 85 normal sinus rhythm.  Coronary CT scan in June 2022 revealed discrete mid LAD stenosis leading to cardiac catheterization that showed moderate disease and was medically managed.  A ZIO monitor was placed July 2023 including high degree AV block with a maximum heart rate of 109 bpm.  Evaluated by EP July 2023 who noted second-degree A-V block type II, 2-1 block and sleep apnea.  She uses Apple watch for heart monitoring which has shown low heart rates during sleep suggesting possible sleep disorder.  Also had experienced significant emotional stress including the death of her niece and the friend's son which she feels may have impacted her heart rhythm.  Today, she presents for cardiovascular evaluation and preoperative clearance.  She experiences low heartbeats, which she associates with previous prednisone  use. Her heart rate is stable with daily  monitoring. She feels well overall, with no chest pain or significant dyspnea, though she occasionally needs to catch her breath. She is limited in mobility by knee issues but can manage a flight of stairs if necessary.  She is scheduled for surgery on November 14th and is undergoing preoperative clearance. She takes aspirin , amlodipine  2.5 mg, Zetia  10 mg, lisinopril HCTZ, and Crestor  5 mg daily. Lab work in June showed an LDL of 71, triglycerides of 111, and a slightly elevated A1c.  She has mild to moderate mitral regurgitation, with a 2023 echocardiogram showing normal heart pump function. She feels her heart beating more prominently when lying on her left side, which is bothersome but not alarming.  She experiences headaches and uses Tylenol  and occasionally Excedrin Migraine, being cautious due to its aspirin  content. She avoids ibuprofen due to her heart condition  Reports no shortness of breath nor dyspnea on exertion. Reports no chest pain, pressure, or tightness. No edema, orthopnea, PND. Reports no palpitations.   Discussed the use of AI scribe software for clinical note transcription with the patient, who gave verbal consent to proceed.     ROS: Pertinent ROS in HPI  Studies Reviewed EKG Interpretation Date/Time:  Thursday November 11 2023 15:04:05 EDT Ventricular Rate:  81 PR Interval:  168 QRS Duration:  128 QT Interval:  426 QTC Calculation: 494 R Axis:   2  Text Interpretation: Sinus rhythm with Premature atrial complexes Right bundle branch block When compared with ECG of 19-Aug-2023 18:31, PREVIOUS ECG IS PRESENT Confirmed by Lucien Blanc [phone])  on 11/11/2023 3:26:38 PM   Echo 08/06/21 IMPRESSIONS     1. Left ventricular ejection fraction, by estimation, is 60 to 65%. The  left ventricle has normal function. The left ventricle has no regional  wall motion abnormalities. The left ventricular internal cavity size was  mildly dilated. There is mild left  ventricular  hypertrophy of the basal-septal segment. Left ventricular  diastolic parameters are consistent with Grade I diastolic dysfunction  (impaired relaxation).   2. Right ventricular systolic function is normal. The right ventricular  size is normal. There is normal pulmonary artery systolic pressure.   3. Left atrial size was moderately dilated.   4. The mitral valve is normal in structure. Mild to moderate mitral valve  regurgitation. No evidence of mitral stenosis.   5. The aortic valve is tricuspid. Aortic valve regurgitation is not  visualized. Aortic valve sclerosis/calcification is present, without any  evidence of aortic stenosis.   6. The inferior vena cava is normal in size with greater than 50%  respiratory variability, suggesting right atrial pressure of 3 mmHg.  STOP-Bang Score:  5        Physical Exam VS:  BP 132/84   Pulse 81   Ht 5' 2 (1.575 m)   Wt 142 lb 12.8 oz (64.8 kg)   SpO2 93%   BMI 26.12 kg/m        Wt Readings from Last 3 Encounters:  11/11/23 142 lb 12.8 oz (64.8 kg)  08/19/23 140 lb (63.5 kg)  05/10/23 135 lb 6.4 oz (61.4 kg)    GEN: Well nourished, well developed in no acute distress NECK: No JVD; No carotid bruits CARDIAC: RRR, no murmurs, rubs, gallops RESPIRATORY:  Clear to auscultation without rales, wheezing or rhonchi  ABDOMEN: Soft, non-tender, non-distended EXTREMITIES:  No edema; No deformity   ASSESSMENT AND PLAN  Preop Clearance   [name]'s perioperative risk of a major cardiac event is 0.4% according to the Revised Cardiac Risk Index (RCRI).  Therefore, she is at low risk for perioperative complications.   Her functional capacity is good at 5.07 METs according to the Duke Activity Status Index (DASI). Recommendations: According to ACC/AHA guidelines, no further cardiovascular testing needed.  The patient may proceed to surgery at acceptable risk.   Antiplatelet and/or Anticoagulation Recommendations: Aspirin  can be held for 5 days  prior to her surgery.  Please resume Aspirin  post operatively when it is felt to be safe from a bleeding standpoint.   Tachycardia/bradycardia -Monitor symptoms, especially if syncope is present -Consider for sleep study to evaluate sleep disorder - Avoid medications that slow heart rate such as diltiazem  Atherosclerotic heart disease of native coronary artery without angina Atherosclerotic heart disease without angina. LDL at 71 mg/dL, triglycerides at 888 mg/dL, both well-controlled.  Mitral regurgitation, mild to moderate Mild to moderate mitral regurgitation on 2023 echocardiogram. No symptoms of worsening. - Consider repeat echocardiogram next year.  Second degree atrioventricular block and bradycardia No recent symptoms of bradycardia or heart block. Heart rate stable.  Essential hypertension Hypertension well-controlled with current regimen. Blood pressure 132/84 mmHg.  Hyperlipidemia Hyperlipidemia well-managed. LDL at 71 mg/dL, triglycerides at 888 mg/dL.  Suspected sleep apnea Suspected sleep apnea due to daytime sleepiness and potential heart rate variability impact. She is open to at-home testing. - Order at-home sleep study using Watch Pat app. - Coordinate with sleep team for instructions and app code.  Migraine headaches Migraines managed with Tylenol . Excedrin use limited due to aspirin  content. Open to  prescription options. - Discuss non-NSAID prescription migraine medications with primary care provider.  Prediabetes Prediabetes with slightly elevated A1c. Monitoring advised.     Dispo: She can follow-up in 6 months with Dr. Jeffrie  Signed, Orren LOISE Fabry, PA-C

## 2023-11-11 NOTE — Patient Instructions (Addendum)
 Thank you for choosing [facility]!     Medication Instructions:  Your refills have been sent to your pharmacy.   *If you need a refill on your cardiac medications before your next appointment, please call your pharmacy*   Lab Work: No labs were ordered during today's visit.  If you have labs (blood work) drawn today and your tests are completely normal, you will receive your results only by: MyChart Message (if you have MyChart) OR A paper copy in the mail If you have any lab test that is abnormal or we need to change your treatment, we will call you to review the results.   Testing/Procedures: Your physician has recommended that you have a sleep study. This test records several body functions during sleep, including: brain activity, eye movement, oxygen and carbon dioxide blood levels, heart rate and rhythm, breathing rate and rhythm, the flow of air through your mouth and nose, snoring, body muscle movements, and chest and belly movement.   Your next appointment:   6 month(s)   Provider:   Oneil Parchment, MD     Follow-Up: At [facility], you and your health needs are our priority.  As part of our continuing mission to provide you with exceptional heart care, we have created designated Provider Care Teams.  These Care Teams include your primary Cardiologist (physician) and Advanced Practice Providers (APPs -  Physician Assistants and Nurse Practitioners) who all work together to provide you with the care you need, when you need it. We recommend signing up for the patient portal called MyChart.  Sign up information is provided on this After Visit Summary.  MyChart is used to connect with patients for Virtual Visits (Telemedicine).  Patients are able to view lab/test results, encounter notes, upcoming appointments, etc.  Non-urgent messages can be sent to your provider as well.   To learn more about what you can do with MyChart, go to [email or web address].

## 2023-11-16 ENCOUNTER — Other Ambulatory Visit: Payer: Self-pay | Admitting: Orthopedic Surgery

## 2023-11-16 DIAGNOSIS — Z96611 Presence of right artificial shoulder joint: Secondary | ICD-10-CM | POA: Diagnosis not present

## 2023-11-16 DIAGNOSIS — M25561 Pain in right knee: Secondary | ICD-10-CM | POA: Diagnosis not present

## 2023-11-16 DIAGNOSIS — T8484XD Pain due to internal orthopedic prosthetic devices, implants and grafts, subsequent encounter: Secondary | ICD-10-CM | POA: Diagnosis not present

## 2023-11-18 ENCOUNTER — Telehealth: Payer: Self-pay | Admitting: *Deleted

## 2023-11-18 DIAGNOSIS — I10 Essential (primary) hypertension: Secondary | ICD-10-CM

## 2023-11-18 DIAGNOSIS — R002 Palpitations: Secondary | ICD-10-CM

## 2023-11-18 DIAGNOSIS — G4719 Other hypersomnia: Secondary | ICD-10-CM

## 2023-11-18 DIAGNOSIS — I2510 Atherosclerotic heart disease of native coronary artery without angina pectoris: Secondary | ICD-10-CM

## 2023-11-18 DIAGNOSIS — R0683 Snoring: Secondary | ICD-10-CM

## 2023-11-18 NOTE — Telephone Encounter (Signed)
-----   Message from Orren LOISE Fabry sent at 11/11/2023  3:52 PM EDT ----- She will need an Software engineer.  Her STOP-BANG is 5.  Also has a history of PVCs and second-degree AV block.  She will need this done before her surgery. Thanks!

## 2023-11-19 NOTE — Telephone Encounter (Signed)
 Ordering provider: ORREN FABRY Associated diagnoses: R06.83 WatchPAT PA obtained on 11/19/2023 by Joshua Dalton Seip, CMA. Authorization: No; tracking ID NO PA REQ Patient notified of PIN (1234) on 11/19/2023 via Notification Method: phone.

## 2023-11-22 ENCOUNTER — Ambulatory Visit
Admission: RE | Admit: 2023-11-22 | Discharge: 2023-11-22 | Disposition: A | Discharge status: Home / Self Care | Source: Ambulatory Visit | Attending: Orthopedic Surgery | Admitting: Orthopedic Surgery

## 2023-11-22 DIAGNOSIS — G8929 Other chronic pain: Secondary | ICD-10-CM | POA: Diagnosis not present

## 2023-11-22 DIAGNOSIS — Z96611 Presence of right artificial shoulder joint: Secondary | ICD-10-CM

## 2023-11-22 DIAGNOSIS — M25561 Pain in right knee: Secondary | ICD-10-CM | POA: Diagnosis not present

## 2023-11-22 DIAGNOSIS — M25511 Pain in right shoulder: Secondary | ICD-10-CM | POA: Diagnosis not present

## 2023-11-22 NOTE — Telephone Encounter (Signed)
**Note De-Identified Keosha Rossa Obfuscation** Patient agreement reviewed and signed on 11/22/2023.  WatchPAT issued to patient on 11/22/2023 by Alexio Sroka, Avelina HERO, LPN. Patient aware to not open the WatchPAT box until contacted with the activation PIN. Patient profile initialized in CloudPAT on 11/22/2023 by Avelina Cadynce Garrette, LPN. Device serial number: [id number]

## 2023-11-23 DIAGNOSIS — Z23 Encounter for immunization: Secondary | ICD-10-CM | POA: Diagnosis not present

## 2023-11-23 DIAGNOSIS — D171 Benign lipomatous neoplasm of skin and subcutaneous tissue of trunk: Secondary | ICD-10-CM | POA: Diagnosis not present

## 2023-11-24 ENCOUNTER — Encounter (HOSPITAL_BASED_OUTPATIENT_CLINIC_OR_DEPARTMENT_OTHER): Payer: Self-pay | Admitting: Cardiology

## 2023-11-24 ENCOUNTER — Telehealth: Payer: Self-pay | Admitting: Cardiology

## 2023-11-24 DIAGNOSIS — G4733 Obstructive sleep apnea (adult) (pediatric): Secondary | ICD-10-CM

## 2023-11-24 DIAGNOSIS — E7219 Other disorders of sulfur-bearing amino-acid metabolism: Secondary | ICD-10-CM

## 2023-11-24 NOTE — Telephone Encounter (Signed)
 Pt calling to ask if tonight is to late to do her at home study. Pt says she spoke with Lyna at her appt. Please advise.

## 2023-11-25 ENCOUNTER — Ambulatory Visit: Attending: Physician Assistant

## 2023-11-25 DIAGNOSIS — G4719 Other hypersomnia: Secondary | ICD-10-CM

## 2023-11-25 DIAGNOSIS — R0683 Snoring: Secondary | ICD-10-CM

## 2023-11-25 NOTE — Procedures (Signed)
    SLEEP STUDY REPORT Patient Information Study Date: 11/24/2023 Patient Name: Vanessa Stewart Patient ID: [id number] Birth Date: 30-Mar-1945 Age: 78 Gender: Female BMI: 26.0 (W=141 lb, H=5' 2'') Referring Physician: Orren Fabry, PA  TEST DESCRIPTION: Home sleep apnea testing was completed using the WatchPat, a Type 1 device, utilizing  peripheral arterial tonometry (PAT), chest movement, actigraphy, pulse oximetry, pulse rate, body position and snore.  AHI was calculated with apnea and hypopnea using valid sleep time as the denominator. RDI includes apneas,  hypopneas, and RERAs. The data acquired and the scoring of sleep and all associated events were performed in  accordance with the recommended standards and specifications as outlined in the AASM Manual for the Scoring of  Sleep and Associated Events 2.2.0 (2015).  FINDINGS:  1. Moderate Obstructive Sleep Apnea with AHI 17.5/hr.   2. No Central Sleep Apnea with pAHIc 1.6/hr.  3. Oxygen desaturations as low as 73%.  4. Moderate to severe snoring was present. O2 sats were < 88% for 91.2 min.  5. Total sleep time was 7 hrs and 6 min.  6. 19.3% of total sleep time was spent in REM sleep.   7. Shortened sleep onset latency at 5 min  8. Prolonged REM sleep onset latency at .   9. Total awakenings were 10.  10. Arrhythmia detection: None  DIAGNOSIS:  Moderate Obstructive Sleep Apnea (G47.33) Nocturnal Hypoxemia  RECOMMENDATIONS: 1. Clinical correlation of these findings is necessary. The decision to treat obstructive sleep apnea (OSA) is usually  based on the presence of apnea symptoms or the presence of associated medical conditions such as Hypertension,  Congestive Heart Failure, Atrial Fibrillation or Obesity. The most common symptoms of OSA are snoring, gasping for  breath while sleeping, daytime sleepiness and fatigue.  2. Initiating apnea therapy is recommended given the presence of symptoms and/or associated  conditions.  Recommend proceeding with one of the following:  a. Auto-CPAP therapy with a pressure range of 5-20cm H2O.  b. An oral appliance (OA) that can be obtained from certain dentists with expertise in sleep medicine. These are  primarily of use in non-obese patients with mild and moderate disease.  c. An ENT consultation which may be useful to look for specific causes of obstruction and possible treatment  options.  d. If patient is intolerant to PAP therapy, consider referral to ENT for evaluation for hypoglossal nerve stimulator.  3. Close follow-up is necessary to ensure success with CPAP or oral appliance therapy for maximum benefit . 4. A follow-up oximetry study on CPAP is recommended to assess the adequacy of therapy and determine the need  for supplemental oxygen or the potential need for Bi-level therapy. An arterial blood gas to determine the adequacy of  baseline ventilation and oxygenation should also be considered. 5. Healthy sleep recommendations include: adequate nightly sleep (normal 7-9 hrs/night), avoidance of caffeine after  noon and alcohol near bedtime, and maintaining a sleep environment that is cool, dark and quiet. 6. Weight loss for overweight patients is recommended. Even modest amounts of weight loss can significantly  improve the severity of sleep apnea. 7. Snoring recommendations include: weight loss where appropriate, side sleeping, and avoidance of alcohol before  bed. 8. Operation of motor vehicle should not be performed when sleepy.  Signature: Wilbert Bihari, MD; [facility]; Diplomat, American Board of Sleep  Medicine Electronically Signed: 11/25/2023 2:53:06 PM

## 2023-11-26 DIAGNOSIS — G8929 Other chronic pain: Secondary | ICD-10-CM | POA: Diagnosis not present

## 2023-11-26 DIAGNOSIS — M25561 Pain in right knee: Secondary | ICD-10-CM | POA: Diagnosis not present

## 2023-12-01 DIAGNOSIS — G8929 Other chronic pain: Secondary | ICD-10-CM | POA: Diagnosis not present

## 2023-12-01 DIAGNOSIS — M25561 Pain in right knee: Secondary | ICD-10-CM | POA: Diagnosis not present

## 2023-12-03 DIAGNOSIS — G8929 Other chronic pain: Secondary | ICD-10-CM | POA: Diagnosis not present

## 2023-12-03 DIAGNOSIS — M25561 Pain in right knee: Secondary | ICD-10-CM | POA: Diagnosis not present

## 2023-12-07 NOTE — H&P (Signed)
 Patient's anticipated LOS is less than 2 midnights, meeting these requirements: - Younger than 69 - Lives within 1 hour of care - Has a competent adult at home to recover with post-op recover - NO history of  - Chronic pain requiring opiods  - Diabetes  - Coronary Artery Disease  - Heart failure  - Heart attack  - Stroke  - DVT/VTE  - Cardiac arrhythmia  - Respiratory Failure/COPD  - Renal failure  - Anemia  - Advanced Liver disease     Vanessa Stewart is an 78 y.o. female.    Chief Complaint: right knee pain  HPI: Pt is a 78 y.o. female complaining of right knee pain for multiple years. Pain had continually increased since the beginning. X-rays in the clinic show end-stage arthritic changes of the right knee. Pt has tried various conservative treatments which have failed to alleviate their symptoms, including injections and therapy. Various options are discussed with the patient. Risks, benefits and expectations were discussed with the patient. Patient understand the risks, benefits and expectations and wishes to proceed with surgery.   PCP:  Loreli Kins, MD  D/C Plans: Home  PMH: Past Medical History:  Diagnosis Date   Allergy    Arthritis    Carotid artery stenosis 07/03/2015   1-39% bilateral by  dopplers 07/2015   Coronary artery disease    Depression    Essential tremor    Headache    Hyperlipidemia    Hypertension    Hypothyroidism    Osteopenia    PONV (postoperative nausea and vomiting)    Prinzmetal's angina    normal coronary arteries with coronary artery vasospasm at time of cath   RBBB    Chronic    PSH: Past Surgical History:  Procedure Laterality Date   CARDIAC CATHETERIZATION  01/10/2008   coronary spasm noted on heart cath. No coronary artery disease. LV function normal   CATARACT EXTRACTION W/ INTRAOCULAR LENS IMPLANT Bilateral    colonoscopy  08/09/2005   ganem- 10 Years   CORONARY PRESSURE/FFR STUDY N/A 07/26/2020   Procedure:  INTRAVASCULAR PRESSURE WIRE/FFR STUDY;  Surgeon: Anner Alm ORN, MD;  Location: [facility] INVASIVE CV LAB;  Service: Cardiovascular;  Laterality: N/A;   KNEE SURGERY     LEFT HEART CATH AND CORONARY ANGIOGRAPHY N/A 07/26/2020   Procedure: LEFT HEART CATH AND CORONARY ANGIOGRAPHY;  Surgeon: Anner Alm ORN, MD;  Location: [facility] INVASIVE CV LAB;  Service: Cardiovascular;  Laterality: N/A;   REVERSE SHOULDER ARTHROPLASTY Right 04/09/2023   Procedure: REVERSE SHOULDER ARTHROPLASTY;  Surgeon: Kay Kemps, MD;  Location: [facility] ORS;  Service: Orthopedics;  Laterality: Right;  interscalene block can we have the knee follow this case   THYROIDECTOMY     TOTAL ABDOMINAL HYSTERECTOMY W/ BILATERAL SALPINGOOPHORECTOMY     TYMPANOSTOMY TUBE PLACEMENT      Social History:  reports that she has never smoked. She has never been exposed to tobacco smoke. She has never used smokeless tobacco. She reports that she does not drink alcohol and does not use drugs. BMI: Estimated body mass index is 26.12 kg/m as calculated from the following:   Height as of 11/11/23: 5' 2 (1.575 m).   Weight as of 11/11/23: 64.8 kg.  Lab Results  Component Value Date   ALBUMIN 4.1 08/10/2021   Diabetes: Patient does not have a diagnosis of diabetes.     Smoking Status:   reports that she has never smoked. She has never been exposed to tobacco smoke.  She has never used smokeless tobacco.    Allergies:  Allergies  Allergen Reactions   Elemental Sulfur Other (See Comments)    GI Issues   Fosamax [Alendronate] Other (See Comments)    Jaw pain    Lipitor [Atorvastatin ] Other (See Comments)    Myalgias    Pravachol  [Pravastatin ] Other (See Comments)    Myalgias    Septra [Sulfamethoxazole-Trimethoprim] Other (See Comments)    GI issues   Sulfa Antibiotics Nausea And Vomiting    GI issues   Zetia  [Ezetimibe ] Other (See Comments)    Myalgias   Crestor  [Rosuvastatin ] Swelling and Other (See Comments)    Myalgias      Medications: No current facility-administered medications for this encounter.   Current Outpatient Medications  Medication Sig Dispense Refill   ALPRAZolam  (XANAX ) 0.5 MG tablet Take 0.5 mg by mouth 2 (two) times daily as needed. (anxiety)     aspirin  81 MG tablet Take 81 mg by mouth every evening.     Calcium  Carb-Cholecalciferol (CALCIUM  + D3 PO) Take 1 tablet by mouth in the morning.     carboxymethylcellulose (REFRESH PLUS) 0.5 % SOLN Place 1 drop into both eyes daily.     cetirizine (ZYRTEC) 10 MG tablet Take 10 mg by mouth in the morning.     famotidine (PEPCID) 20 MG tablet Take 20 mg by mouth daily.     levothyroxine  (SYNTHROID , LEVOTHROID) 50 MCG tablet Take 50 mcg by mouth daily before breakfast.     Magnesium 250 MG CAPS Take 250 mg by mouth daily. (Patient not taking: Reported on 11/11/2023)     Multiple Vitamins-Minerals (OCUVITE ADULT 50+ PO) Take 1 tablet by mouth daily.     Omega-3 Fatty Acids (FISH OIL PO) Take 1,200 mg by mouth every evening.     PARoxetine  (PAXIL ) 20 MG tablet Take 20 mg by mouth daily.     potassium chloride  SA (K-DUR,KLOR-CON ) 20 MEQ tablet Take 1 tablet (20 mEq total) by mouth 2 (two) times daily. Please make overdue appt with Dr. Shlomo before anymore refills. 1st attempt 60 tablet 0   predniSONE  (DELTASONE ) 10 MG tablet Prednisone  10mg  : Take 2 tablets twice a day for 3 more days, Then take 2 tablets once a day for 1 day., then take 1 tablet once a day for 1 day. (Patient not taking: Reported on 11/11/2023) 15 tablet 0   risedronate (ACTONEL) 35 MG tablet Take 35 mg by mouth every Saturday.      No results found for this or any previous visit (from the past 48 hours). No results found.  ROS: Pain with rom of the right lower extremity  Physical Exam: Alert and oriented 78 y.o. female in no acute distress Cranial nerves 2-12 intact Cervical spine: full rom with no tenderness, nv intact distally Chest: active breath sounds bilaterally, no  wheeze rhonchi or rales Heart: regular rate and rhythm, no murmur Abd: non tender non distended with active bowel sounds Hip is stable with rom  Right knee painful rom with crepitus Nv intact distally Antalgic gait  Assessment/Plan Assessment: right knee end stage osteoarthritis  Plan:  Patient will undergo a right total knee by Dr. Kay at [place] Risks benefits and expectations were discussed with the patient. Patient understand risks, benefits and expectations and wishes to proceed. Preoperative templating of the joint replacement has been completed, documented, and submitted to the Operating Room personnel in order to optimize intra-operative equipment management.   Brad Imanol Bihl PA-C, MPAS [employer]  is now [facility]  [place] [address], [place], [place] [address] Phone: [phone] [email or web address] Facebook  [employer]

## 2023-12-08 DIAGNOSIS — G8929 Other chronic pain: Secondary | ICD-10-CM | POA: Diagnosis not present

## 2023-12-08 DIAGNOSIS — M25561 Pain in right knee: Secondary | ICD-10-CM | POA: Diagnosis not present

## 2023-12-14 ENCOUNTER — Telehealth: Payer: Self-pay | Admitting: Cardiology

## 2023-12-14 NOTE — Telephone Encounter (Signed)
 Pt calling to f/u on Sleep results being that she has an upcoming procedure next week. Please advise

## 2023-12-14 NOTE — Patient Instructions (Signed)
 SURGICAL WAITING ROOM VISITATION Patients having surgery or a procedure may have no more than 2 support people in the waiting area - these visitors may rotate.    Children under the age of 7 must have an adult with them who is not the patient.  If the patient needs to stay at the hospital during part of their recovery, the visitor guidelines for inpatient rooms apply. Pre-op nurse will coordinate an appropriate time for 1 support person to accompany patient in pre-op.  This support person may not rotate.    Please refer to the [facility] website for the visitor guidelines for Inpatients (after your surgery is over and you are in a regular room).       Your procedure is scheduled on: 12-24-23   Report to [facility] Main Entrance    Report to admitting at 12:30 PM   Call this number if you have problems the morning of surgery [phone]   Do not eat food :After Midnight.   After Midnight you may have the following liquids until 12:00 PM DAY OF SURGERY  Water  Non-Citrus Juices (without pulp, NO RED-Apple, White grape, White cranberry) Black Coffee (NO MILK/CREAM OR CREAMERS, sugar ok)  Clear Tea (NO MILK/CREAM OR CREAMERS, sugar ok) regular and decaf                             Plain Jell-O (NO RED)                                           Fruit ices (not with fruit pulp, NO RED)                                     Popsicles (NO RED)                                                               Sports drinks like Gatorade (NO RED)                   The day of surgery:  Drink ONE (1) Pre-Surgery G2 by 12:00 PM the morning of surgery. Drink in one sitting. Do not sip.  This drink was given to you during your hospital  pre-op appointment visit. Nothing else to drink after completing the Pre-Surgery G2.          If you have questions, please contact your surgeon's office.   FOLLOW  ANY ADDITIONAL PRE OP INSTRUCTIONS YOU RECEIVED FROM YOUR SURGEON'S OFFICE!!!      Oral Hygiene is also important to reduce your risk of infection.                                    Remember - BRUSH YOUR TEETH THE MORNING OF SURGERY WITH YOUR REGULAR TOOTHPASTE   Do NOT smoke after Midnight   Take these medicines the morning of surgery with A SIP OF WATER :    Famotidine   Levothyroxine   Paroxetine    Zyrtec   Alprazolam  if needed   Stop all vitamins and herbal supplements 7 days before surgery  Bring CPAP mask and tubing day of surgery.                              You may not have any metal on your body including hair pins, jewelry, and body piercing             Do not wear make-up, lotions, powders, perfumes, or deodorant  Do not wear nail polish including gel and S&S, artificial/acrylic nails, or any other type of covering on natural nails including finger and toenails. If you have artificial nails, gel coating, etc. that needs to be removed by a nail salon please have this removed prior to surgery or surgery may need to be canceled/ delayed if the surgeon/ anesthesia feels like they are unable to be safely monitored.   Do not shave  48 hours prior to surgery.    Do not bring valuables to the hospital. [facility] IS NOT RESPONSIBLE   FOR VALUABLES.   Contacts, dentures or bridgework may not be worn into surgery.  DO NOT BRING YOUR HOME MEDICATIONS TO THE HOSPITAL. PHARMACY WILL DISPENSE MEDICATIONS LISTED ON YOUR MEDICATION LIST TO YOU DURING YOUR ADMISSION IN THE HOSPITAL!    Patients discharged on the day of surgery will not be allowed to drive home.  Someone NEEDS to stay with you for the first 24 hours after anesthesia.   Special Instructions: Bring a copy of your healthcare power of attorney and living will documents the day of surgery if you haven't scanned them before.              Please read over the following fact sheets you were given: IF YOU HAVE QUESTIONS ABOUT YOUR PRE-OP INSTRUCTIONS PLEASE CALL [phone] [facility]  If you received a COVID  test during your pre-op visit  it is requested that you wear a mask when out in public, stay away from anyone that may not be feeling well and notify your surgeon if you develop symptoms. If you test positive for Covid or have been in contact with anyone that has tested positive in the last 10 days please notify you surgeon.   Pre-operative 4 CHG Bath Instructions  DYNA-Hex 4 Chlorhexidine  Gluconate 4% Solution Antiseptic 4 fl. oz   You can play a key role in reducing the risk of infection after surgery. Your skin needs to be as free of germs as possible. You can reduce the number of germs on your skin by washing with CHG (chlorhexidine  gluconate) soap before surgery. CHG is an antiseptic soap that kills germs and continues to kill germs even after washing.   DO NOT use if you have an allergy to chlorhexidine /CHG or antibacterial soaps. If your skin becomes reddened or irritated, stop using the CHG and notify one of our RNs at   Please shower with the CHG soap starting 4 days before surgery using the following schedule:     Please keep in mind the following:  DO NOT shave, including legs and underarms, starting the day of your first shower.   You may shave your face at any point before/day of surgery.  Place clean sheets on your bed the day you start using CHG soap. Use a clean washcloth (not used since being washed) for each shower. DO NOT sleep with pets once you start using  the CHG.  CHG Shower Instructions:  If you choose to wash your hair and private area, wash first with your normal shampoo/soap.  After you use shampoo/soap, rinse your hair and body thoroughly to remove shampoo/soap residue.  Turn the water  OFF and apply about 3 tablespoons (45 ml) of CHG soap to a CLEAN washcloth.  Apply CHG soap ONLY FROM YOUR NECK DOWN TO YOUR TOES (washing for 3-5 minutes)  DO NOT use CHG soap on face, private areas, open wounds, or sores.  Pay special attention to the area where your surgery is  being performed.  If you are having back surgery, having someone wash your back for you may be helpful. Wait 2 minutes after CHG soap is applied, then you may rinse off the CHG soap.  Pat dry with a clean towel  Put on clean clothes/pajamas   If you choose to wear lotion, please use ONLY the CHG-compatible lotions on the back of this paper.     Additional instructions for the day of surgery: DO NOT APPLY any lotions, deodorants, cologne, or perfumes.   Put on clean/comfortable clothes.  Brush your teeth.  Ask your nurse before applying any prescription medications to the skin.   CHG Compatible Lotions   Aveeno Moisturizing lotion  Cetaphil Moisturizing Cream  Cetaphil Moisturizing Lotion  Clairol Herbal Essence Moisturizing Lotion, Dry Skin  Clairol Herbal Essence Moisturizing Lotion, Extra Dry Skin  Clairol Herbal Essence Moisturizing Lotion, Normal Skin  Curel Age Defying Therapeutic Moisturizing Lotion with Alpha Hydroxy  Curel Extreme Care Body Lotion  Curel Soothing Hands Moisturizing Hand Lotion  Curel Therapeutic Moisturizing Cream, Fragrance-Free  Curel Therapeutic Moisturizing Lotion, Fragrance-Free  Curel Therapeutic Moisturizing Lotion, Original Formula  Eucerin Daily Replenishing Lotion  Eucerin Dry Skin Therapy Plus Alpha Hydroxy Crme  Eucerin Dry Skin Therapy Plus Alpha Hydroxy Lotion  Eucerin Original Crme  Eucerin Original Lotion  Eucerin Plus Crme Eucerin Plus Lotion  Eucerin TriLipid Replenishing Lotion  Keri Anti-Bacterial Hand Lotion  Keri Deep Conditioning Original Lotion Dry Skin Formula Softly Scented  Keri Deep Conditioning Original Lotion, Fragrance Free Sensitive Skin Formula  Keri Lotion Fast Absorbing Fragrance Free Sensitive Skin Formula  Keri Lotion Fast Absorbing Softly Scented Dry Skin Formula  Keri Original Lotion  Keri Skin Renewal Lotion Keri Silky Smooth Lotion  Keri Silky Smooth Sensitive Skin Lotion  Nivea Body Creamy Conditioning  Oil  Nivea Body Extra Enriched Lotion  Nivea Body Original Lotion  Nivea Body Sheer Moisturizing Lotion Nivea Crme  Nivea Skin Firming Lotion  NutraDerm 30 Skin Lotion  NutraDerm Skin Lotion  NutraDerm Therapeutic Skin Cream  NutraDerm Therapeutic Skin Lotion  ProShield Protective Hand Cream  Provon moisturizing lotion   PATIENT SIGNATURE_________________________________  NURSE SIGNATURE__________________________________  ________________________________________________________________________    Nasario Exon  An incentive spirometer is a tool that can help keep your lungs clear and active. This tool measures how well you are filling your lungs with each breath. Taking long deep breaths may help reverse or decrease the chance of developing breathing (pulmonary) problems (especially infection) following: A long period of time when you are unable to move or be active. BEFORE THE PROCEDURE  If the spirometer includes an indicator to show your best effort, your nurse or respiratory therapist will set it to a desired goal. If possible, sit up straight or lean slightly forward. Try not to slouch. Hold the incentive spirometer in an upright position. INSTRUCTIONS FOR USE  Sit on the edge of your bed if possible, or  sit up as far as you can in bed or on a chair. Hold the incentive spirometer in an upright position. Breathe out normally. Place the mouthpiece in your mouth and seal your lips tightly around it. Breathe in slowly and as deeply as possible, raising the piston or the ball toward the top of the column. Hold your breath for 3-5 seconds or for as long as possible. Allow the piston or ball to fall to the bottom of the column. Remove the mouthpiece from your mouth and breathe out normally. Rest for a few seconds and repeat Steps 1 through 7 at least 10 times every 1-2 hours when you are awake. Take your time and take a few normal breaths between deep breaths. The  spirometer may include an indicator to show your best effort. Use the indicator as a goal to work toward during each repetition. After each set of 10 deep breaths, practice coughing to be sure your lungs are clear. If you have an incision (the cut made at the time of surgery), support your incision when coughing by placing a pillow or rolled up towels firmly against it. Once you are able to get out of bed, walk around indoors and cough well. You may stop using the incentive spirometer when instructed by your caregiver.  RISKS AND COMPLICATIONS Take your time so you do not get dizzy or light-headed. If you are in pain, you may need to take or ask for pain medication before doing incentive spirometry. It is harder to take a deep breath if you are having pain. AFTER USE Rest and breathe slowly and easily. It can be helpful to keep track of a log of your progress. Your caregiver can provide you with a simple table to help with this. If you are using the spirometer at home, follow these instructions: SEEK MEDICAL CARE IF:  You are having difficultly using the spirometer. You have trouble using the spirometer as often as instructed. Your pain medication is not giving enough relief while using the spirometer. You develop fever of 100.5 F (38.1 C) or higher. SEEK IMMEDIATE MEDICAL CARE IF:  You cough up bloody sputum that had not been present before. You develop fever of 102 F (38.9 C) or greater. You develop worsening pain at or near the incision site. MAKE SURE YOU:  Understand these instructions. Will watch your condition. Will get help right away if you are not doing well or get worse. Document Released: 06/08/2006 Document Revised: 04/20/2011 Document Reviewed: 08/09/2006 [facility] Patient Information 2014 [place], [place].   ________________________________________________________________________

## 2023-12-15 ENCOUNTER — Encounter (HOSPITAL_COMMUNITY): Payer: Self-pay

## 2023-12-15 DIAGNOSIS — G8929 Other chronic pain: Secondary | ICD-10-CM | POA: Diagnosis not present

## 2023-12-15 DIAGNOSIS — M25561 Pain in right knee: Secondary | ICD-10-CM | POA: Diagnosis not present

## 2023-12-15 NOTE — Progress Notes (Signed)
  Date of COVID positive in last 90 days:  No  PCP - Joen Gentry, MD Cardiologist - Oneil Parchment, MD Neurologist - Camellia Custard, MD  Chest x-ray - 08-19-23 Epic EKG - 11-11-23 Epic Stress Test - 02-24-17 Epic ECHO - 08-06-21 Epic Cardiac Cath - 07-26-20 Epic Long Term Monitor - 08-27-21 Epic Coronary CT - 07-18-20 Epic Pacemaker/ICD device last checked:N/A Spinal Cord Stimulator:N/A  Bowel Prep - N/A  Sleep Study - Yes, has not gotten results CPAP -   Prediabetes Fasting Blood Sugar - does not check  Checks Blood Sugar _____ times a day  Last dose of GLP1 agonist-  N/A GLP1 instructions:  Do not take after     Last dose of SGLT-2 inhibitors-  N/A SGLT-2 instructions:  Do not take after    Blood Thinner Instructions: N/A  Aspirin  81 mg - patient to check to see if she needs to stop  Activity level:  Can go up a flight of stairs and perform activities of daily living without stopping and without symptoms of chest pain or shortness of breath.  Anesthesia review: CAD, carotid artery stenosis, angina, RBBB, HTN, prediabetes, suspected sleep apnea  Patient denies shortness of breath, fever, cough and chest pain at PAT appointment  Patient verbalized understanding of instructions that were given to them at the PAT appointment. Patient was also instructed that they will need to review over the PAT instructions again at home before surgery.

## 2023-12-16 ENCOUNTER — Encounter (HOSPITAL_COMMUNITY)
Admission: RE | Admit: 2023-12-16 | Discharge: 2023-12-16 | Disposition: A | Discharge status: Home / Self Care | Source: Ambulatory Visit | Attending: Orthopedic Surgery | Admitting: Orthopedic Surgery

## 2023-12-16 ENCOUNTER — Other Ambulatory Visit: Payer: Self-pay

## 2023-12-16 ENCOUNTER — Encounter (HOSPITAL_COMMUNITY): Payer: Self-pay

## 2023-12-16 VITALS — BP 139/86 | HR 74 | Temp 98.2°F | Resp 18 | Ht 63.0 in | Wt 141.4 lb

## 2023-12-16 DIAGNOSIS — I4510 Unspecified right bundle-branch block: Secondary | ICD-10-CM | POA: Insufficient documentation

## 2023-12-16 DIAGNOSIS — Z96611 Presence of right artificial shoulder joint: Secondary | ICD-10-CM | POA: Diagnosis not present

## 2023-12-16 DIAGNOSIS — I2510 Atherosclerotic heart disease of native coronary artery without angina pectoris: Secondary | ICD-10-CM | POA: Insufficient documentation

## 2023-12-16 DIAGNOSIS — M1711 Unilateral primary osteoarthritis, right knee: Secondary | ICD-10-CM | POA: Diagnosis not present

## 2023-12-16 DIAGNOSIS — I10 Essential (primary) hypertension: Secondary | ICD-10-CM | POA: Insufficient documentation

## 2023-12-16 DIAGNOSIS — G4730 Sleep apnea, unspecified: Secondary | ICD-10-CM | POA: Diagnosis not present

## 2023-12-16 DIAGNOSIS — Z01812 Encounter for preprocedural laboratory examination: Secondary | ICD-10-CM | POA: Diagnosis present

## 2023-12-16 DIAGNOSIS — E039 Hypothyroidism, unspecified: Secondary | ICD-10-CM | POA: Diagnosis not present

## 2023-12-16 DIAGNOSIS — Z01818 Encounter for other preprocedural examination: Secondary | ICD-10-CM

## 2023-12-16 HISTORY — DX: Anxiety disorder, unspecified: F41.9

## 2023-12-16 HISTORY — DX: Prediabetes: R73.03

## 2023-12-16 LAB — CBC
HCT: 44.2 % (ref 36.0–46.0)
Hemoglobin: 13.9 g/dL (ref 12.0–15.0)
MCH: 29.3 pg (ref 26.0–34.0)
MCHC: 31.4 g/dL (ref 30.0–36.0)
MCV: 93.2 fL (ref 80.0–100.0)
Platelets: 252 K/uL (ref 150–400)
RBC: 4.74 MIL/uL (ref 3.87–5.11)
RDW: 13 % (ref 11.5–15.5)
WBC: 6 K/uL (ref 4.0–10.5)
nRBC: 0 % (ref 0.0–0.2)

## 2023-12-16 LAB — SURGICAL PCR SCREEN
MRSA, PCR: NEGATIVE
Staphylococcus aureus: POSITIVE — AB

## 2023-12-16 LAB — BASIC METABOLIC PANEL WITH GFR
Anion gap: 11 (ref 5–15)
BUN: 14 mg/dL (ref 8–23)
CO2: 28 mmol/L (ref 22–32)
Calcium: 10.4 mg/dL — ABNORMAL HIGH (ref 8.9–10.3)
Chloride: 102 mmol/L (ref 98–111)
Creatinine, Ser: 0.46 mg/dL (ref 0.44–1.00)
GFR, Estimated: >60 mL/min (ref >60)
Glucose, Bld: 105 mg/dL — ABNORMAL HIGH (ref 70–99)
Potassium: 4 mmol/L (ref 3.5–5.1)
Sodium: 141 mmol/L (ref 135–145)

## 2023-12-16 NOTE — Telephone Encounter (Signed)
 Pt was calling to f/u on her getting a callback. She stated she still hasn't heard anything but needed to before her procedure next Friday on 12/24/2023. Please advise

## 2023-12-16 NOTE — Progress Notes (Signed)
 PCR results sent to Dr. Kay for review.

## 2023-12-17 NOTE — Anesthesia Preprocedure Evaluation (Addendum)
 Anesthesia Evaluation  Patient identified by MRN, date of birth, ID band Patient awake    Reviewed: Allergy & Precautions, H&P , NPO status , Patient's Chart, lab work & pertinent test results  History of Anesthesia Complications (+) PONV and history of anesthetic complications  Airway Mallampati: II  TM Distance: >3 FB Neck ROM: Full    Dental  (+) Teeth Intact, Implants   Pulmonary sleep apnea , neg COPD, Patient abstained from smoking.Not current smoker   Pulmonary exam normal breath sounds clear to auscultation       Cardiovascular Exercise Tolerance: Good METShypertension, Pt. on medications (-) angina + CAD  (-) Past MI + dysrhythmias + Valvular Problems/Murmurs MR  Rhythm:Regular Rate:Normal - Systolic murmurs Per cardiology note:  Preop Clearance    Ms. Starling's perioperative risk of a major cardiac event is 0.4% according to the Revised Cardiac Risk Index (RCRI).  Therefore, she is at low risk for perioperative complications.   Her functional capacity is good at 5.07 METs according to the Duke Activity Status Index (DASI). Recommendations: According to ACC/AHA guidelines, no further cardiovascular testing needed.  The patient may proceed to surgery at acceptable risk.   Antiplatelet and/or Anticoagulation Recommendations: Aspirin  can be held for 5 days prior to her surgery.  Please resume Aspirin  post operatively when it is felt to be safe from a bleeding standpoint.    Tachycardia/bradycardia -Monitor symptoms, especially if syncope is present -Consider for sleep study to evaluate sleep disorder - Avoid medications that slow heart rate such as diltiazem   Atherosclerotic heart disease of native coronary artery without angina Atherosclerotic heart disease without angina. LDL at 71 mg/dL, triglycerides at 888 mg/dL, both well-controlled.   Mitral regurgitation, mild to moderate Mild to moderate mitral regurgitation  on 2023 echocardiogram. No symptoms of worsening. - Consider repeat echocardiogram next year.   Second degree atrioventricular block and bradycardia No recent symptoms of bradycardia or heart block. Heart rate stable.   Essential hypertension Hypertension well-controlled with current regimen. Blood pressure 132/84 mmHg.   Hyperlipidemia Hyperlipidemia well-managed. LDL at 71 mg/dL, triglycerides at 888 mg/dL.   Suspected sleep apnea Suspected sleep apnea due to daytime sleepiness and potential heart rate variability impact. She is open to at-home testing. - Order at-home sleep study using Watch Pat app. - Coordinate with sleep team for instructions and app code.    Neuro/Psych  Headaches, neg Seizures PSYCHIATRIC DISORDERS Anxiety Depression       GI/Hepatic Neg liver ROS,GERD  Medicated and Controlled,,  Endo/Other  neg diabetesHypothyroidism    Renal/GU negative Renal ROS  negative genitourinary   Musculoskeletal negative musculoskeletal ROS (+)    Abdominal   Peds negative pediatric ROS (+)  Hematology negative hematology ROS (+)   Anesthesia Other Findings Past Medical History: No date: Allergy No date: Anxiety No date: Arthritis 07/03/2015: Carotid artery stenosis     Comment:  1-39% bilateral by  dopplers 07/2015 No date: Coronary artery disease No date: Depression No date: Essential tremor No date: Headache No date: Hyperlipidemia No date: Hypertension No date: Hypothyroidism No date: Osteopenia No date: PONV (postoperative nausea and vomiting) No date: Pre-diabetes No date: Prinzmetal's angina     Comment:  normal coronary arteries with coronary artery vasospasm               at time of cath No date: RBBB     Comment:  Chronic  Reproductive/Obstetrics negative OB ROS  Anesthesia Physical Anesthesia Plan  ASA: 2  Anesthesia Plan: Spinal   Post-op Pain Management: Regional block* and  Ofirmev  IV (intra-op)*   Induction: Intravenous  PONV Risk Score and Plan: 3 and Ondansetron , Dexamethasone , Propofol  infusion, TIVA and Treatment may vary due to age or medical condition  Airway Management Planned: Natural Airway  Additional Equipment: None  Intra-op Plan:   Post-operative Plan:   Informed Consent: I have reviewed the patients History and Physical, chart, labs and discussed the procedure including the risks, benefits and alternatives for the proposed anesthesia with the patient or authorized representative who has indicated his/her understanding and acceptance.       Plan Discussed with: CRNA and Surgeon  Anesthesia Plan Comments: (Discussed R/B/A of neuraxial anesthesia technique with patient: - rare risks of spinal/epidural hematoma, nerve damage, infection - Risk of PDPH - Risk of nausea and vomiting - Risk of conversion to general anesthesia and its associated risks, including sore throat, damage to lips/eyes/teeth/oropharynx, and rare risks such as cardiac and respiratory events. - Risk of allergic reactions  Discussed r/b/a of adductor canal nerve block, including:  - bleeding, infection, nerve damage - poor or non functioning block. - reactions and toxicity to local anesthetic Patient understands.   Discussed the role of CRNA in patient's perioperative care.  Patient voiced understanding.)         Anesthesia Quick Evaluation

## 2023-12-17 NOTE — Progress Notes (Signed)
 Anesthesia Chart Review   Case: [id number] Date/Time: 12/24/23 1510   Procedure: ARTHROPLASTY, KNEE, TOTAL (Right: Knee)   Anesthesia type: Choice   Diagnosis: Primary osteoarthritis of right knee [M17.11]   Pre-op diagnosis: Arthritis of right knee   Location: [facility] ROOM 06 / [facility]   Surgeons: Kay Kemps, MD       DISCUSSION:78 y.o. never smoker with h/o sleep apnea on recent sleep study has not started therapy, PONV, hypothyroidism, HTN, RBBB, bradycardia, AV block, CAD, mild to moderate mitral regurgitation on Echo 2023, essential tremor, right knee OA scheduled for above procedure 12/24/2023 with Dr. Kemps Kay.   S/p shoulder arthroplasty 04/09/2023 with no anesthesia complications.   Per cardiology preoperative evaluation 11/11/23, Ms. Keeling perioperative risk of a major cardiac event is 0.4% according to the Revised Cardiac Risk Index (RCRI).  Therefore, she is at low risk for perioperative complications.   Her functional capacity is good at 5.07 METs according to the Duke Activity Status Index (DASI). Recommendations: According to ACC/AHA guidelines, no further cardiovascular testing needed.  The patient may proceed to surgery at acceptable risk.   Antiplatelet and/or Anticoagulation Recommendations: Aspirin  can be held for 5 days prior to her surgery.  Please resume Aspirin  post operatively when it is felt to be safe from a bleeding standpoint.  VS: BP 139/86   Pulse 74   Temp 36.8 C (Oral)   Resp 18   Ht 5' 3 (1.6 m)   Wt 64.1 kg   SpO2 94%   BMI 25.05 kg/m   PROVIDERS: Loreli Kins, MD is PCP  Oneil Parchment, MD is Cardiologist   LABS: Labs reviewed: Acceptable for surgery. (all labs ordered are listed, but only abnormal results are displayed)  Labs Reviewed  SURGICAL PCR SCREEN - Abnormal; Notable for the following components:      Result Value   Staphylococcus aureus POSITIVE (*)    All other components within normal limits  BASIC METABOLIC PANEL WITH  GFR - Abnormal; Notable for the following components:   Glucose, Bld 105 (*)    Calcium  10.4 (*)    All other components within normal limits  CBC     IMAGES: VAS US  Carotid 05/10/2023 Summary:  Right Carotid: Velocities in the right ICA are consistent with a 1-39%  stenosis.   Left Carotid: Velocities in the left ICA are consistent with a 1-39%  stenosis.   EKG:   CV: Echo 08/06/2021 1. Left ventricular ejection fraction, by estimation, is 60 to 65%. The  left ventricle has normal function. The left ventricle has no regional  wall motion abnormalities. The left ventricular internal cavity size was  mildly dilated. There is mild left  ventricular hypertrophy of the basal-septal segment. Left ventricular  diastolic parameters are consistent with Grade I diastolic dysfunction  (impaired relaxation).   2. Right ventricular systolic function is normal. The right ventricular  size is normal. There is normal pulmonary artery systolic pressure.   3. Left atrial size was moderately dilated.   4. The mitral valve is normal in structure. Mild to moderate mitral valve  regurgitation. No evidence of mitral stenosis.   5. The aortic valve is tricuspid. Aortic valve regurgitation is not  visualized. Aortic valve sclerosis/calcification is present, without any  evidence of aortic stenosis.   6. The inferior vena cava is normal in size with greater than 50%  respiratory variability, suggesting right atrial pressure of 3 mmHg.   Cardiac Cath 07/26/2020  Mid LAD-1 lesion is 30%  stenosed. Mid LAD-2 lesion is 60% stenosed. RFR 0.91 -> FFR 0.87 (Not significant) Mid LAD to Dist LAD lesion is 30% stenosed. Mid Cx lesion is 60% stenosed. RFR 0.93 (Not significant) LV end diastolic pressure is normal.   SUMMARY Moderate Two-Vessel Disease: Both non-Physiologically significant lesions. Mid LAD discrete eccentric, 60-65% stenosis just prior to branching into equal size major 3rd Diag and distal  LAD  CT FFR positive, however RFR 0.91, FFR 0.87 -> not physiologically significant Focal ostial  60% lesion in the LCx just after 1st Mrg --  RFR negative, 0-.93 Normal RCA Normal LVEDP     RECOMMENDATIONS Aggressive Guideline Directed Medical Management for moderate CAD Past Medical History:  Diagnosis Date   Allergy    Anxiety    Arthritis    Carotid artery stenosis 07/03/2015   1-39% bilateral by  dopplers 07/2015   Coronary artery disease    Depression    Essential tremor    Headache    Hyperlipidemia    Hypertension    Hypothyroidism    Osteopenia    PONV (postoperative nausea and vomiting)    Pre-diabetes    Prinzmetal's angina    normal coronary arteries with coronary artery vasospasm at time of cath   RBBB    Chronic    Past Surgical History:  Procedure Laterality Date   CARDIAC CATHETERIZATION  01/10/2008   coronary spasm noted on heart cath. No coronary artery disease. LV function normal   CATARACT EXTRACTION W/ INTRAOCULAR LENS IMPLANT Bilateral    colonoscopy  08/09/2005   ganem- 10 Years   CORONARY PRESSURE/FFR STUDY N/A 07/26/2020   Procedure: INTRAVASCULAR PRESSURE WIRE/FFR STUDY;  Surgeon: Anner Alm ORN, MD;  Location: [facility] INVASIVE CV LAB;  Service: Cardiovascular;  Laterality: N/A;   KNEE SURGERY     LEFT HEART CATH AND CORONARY ANGIOGRAPHY N/A 07/26/2020   Procedure: LEFT HEART CATH AND CORONARY ANGIOGRAPHY;  Surgeon: Anner Alm ORN, MD;  Location: [facility] INVASIVE CV LAB;  Service: Cardiovascular;  Laterality: N/A;   REVERSE SHOULDER ARTHROPLASTY Right 04/09/2023   Procedure: REVERSE SHOULDER ARTHROPLASTY;  Surgeon: Kay Kemps, MD;  Location: [facility];  Service: Orthopedics;  Laterality: Right;  interscalene block can we have the knee follow this case   THYROIDECTOMY     TOTAL ABDOMINAL HYSTERECTOMY W/ BILATERAL SALPINGOOPHORECTOMY     TYMPANOSTOMY TUBE PLACEMENT      MEDICATIONS:  acidophilus (RISAQUAD) CAPS capsule   ALPRAZolam  (XANAX ) 0.5  MG tablet   amLODipine  (NORVASC ) 2.5 MG tablet   aspirin  81 MG tablet   BLACK ELDERBERRY PO   Calcium  Carb-Cholecalciferol (CALCIUM  + D3 PO)   carboxymethylcellulose (REFRESH PLUS) 0.5 % SOLN   cetirizine (ZYRTEC) 10 MG tablet   ezetimibe  (ZETIA ) 10 MG tablet   famotidine (PEPCID) 20 MG tablet   guaiFENesin (MUCINEX) 600 MG 12 hr tablet   levothyroxine  (SYNTHROID , LEVOTHROID) 50 MCG tablet   lisinopril-hydrochlorothiazide (ZESTORETIC) 20-25 MG tablet   Multiple Vitamins-Minerals (OCUVITE ADULT 50+ PO)   Omega-3 Fatty Acids (FISH OIL) 1200 MG CAPS   PARoxetine  (PAXIL ) 20 MG tablet   potassium chloride  SA (K-DUR,KLOR-CON ) 20 MEQ tablet   predniSONE  (DELTASONE ) 10 MG tablet   risedronate (ACTONEL) 35 MG tablet   rosuvastatin  (CRESTOR ) 5 MG tablet   No current facility-administered medications for this encounter.    Harlene Hoots Ward, PA-C [facility] [phone]

## 2023-12-20 ENCOUNTER — Telehealth: Payer: Self-pay | Admitting: *Deleted

## 2023-12-20 DIAGNOSIS — I10 Essential (primary) hypertension: Secondary | ICD-10-CM

## 2023-12-20 DIAGNOSIS — I2510 Atherosclerotic heart disease of native coronary artery without angina pectoris: Secondary | ICD-10-CM

## 2023-12-20 DIAGNOSIS — G4733 Obstructive sleep apnea (adult) (pediatric): Secondary | ICD-10-CM

## 2023-12-20 DIAGNOSIS — R0683 Snoring: Secondary | ICD-10-CM

## 2023-12-20 DIAGNOSIS — G4719 Other hypersomnia: Secondary | ICD-10-CM

## 2023-12-20 NOTE — Telephone Encounter (Signed)
 The patient has been notified of the result and verbalized understanding.  All questions (if any) were answered. Joshua Dalton Seip, CMA 12/20/2023 11:07 AM     Precert titration  Prior Authorization for titration sent to [employer] via web portal. Tracking Number  Case [id number] Review Date: 12/20/2023 11:36:50 AM Expiration Date: N/A  Status:Pending

## 2023-12-20 NOTE — Telephone Encounter (Signed)
-----   Message from Wilbert Bihari sent at 11/25/2023  2:54 PM EDT ----- Please let patient know that they have sleep apnea.  Recommend therapeutic CPAP titration for treatment of patient's sleep disordered breathing.

## 2023-12-20 NOTE — Telephone Encounter (Signed)
 The patient has been notified of the result and verbalized understanding.  All questions (if any) were answered. Joshua Dalton Seip, CMA 12/20/2023 11:07 AM

## 2023-12-22 ENCOUNTER — Ambulatory Visit: Payer: Self-pay | Admitting: Physician Assistant

## 2023-12-24 ENCOUNTER — Observation Stay (HOSPITAL_COMMUNITY)
Admission: RE | Admit: 2023-12-24 | Discharge: 2023-12-26 | Disposition: A | Discharge status: Home / Self Care | Source: Ambulatory Visit | Attending: Orthopedic Surgery | Admitting: Orthopedic Surgery

## 2023-12-24 ENCOUNTER — Encounter (HOSPITAL_COMMUNITY)
Admission: RE | Disposition: A | Discharge status: Home / Self Care | Payer: Self-pay | Source: Ambulatory Visit | Attending: Orthopedic Surgery

## 2023-12-24 ENCOUNTER — Ambulatory Visit (HOSPITAL_COMMUNITY): Payer: Self-pay | Admitting: Physician Assistant

## 2023-12-24 ENCOUNTER — Ambulatory Visit (HOSPITAL_BASED_OUTPATIENT_CLINIC_OR_DEPARTMENT_OTHER): Payer: Self-pay

## 2023-12-24 ENCOUNTER — Encounter (HOSPITAL_COMMUNITY): Payer: Self-pay | Admitting: Orthopedic Surgery

## 2023-12-24 ENCOUNTER — Other Ambulatory Visit: Payer: Self-pay

## 2023-12-24 DIAGNOSIS — E039 Hypothyroidism, unspecified: Secondary | ICD-10-CM | POA: Diagnosis not present

## 2023-12-24 DIAGNOSIS — I10 Essential (primary) hypertension: Secondary | ICD-10-CM | POA: Diagnosis not present

## 2023-12-24 DIAGNOSIS — Z7982 Long term (current) use of aspirin: Secondary | ICD-10-CM | POA: Diagnosis not present

## 2023-12-24 DIAGNOSIS — M25561 Pain in right knee: Secondary | ICD-10-CM | POA: Diagnosis present

## 2023-12-24 DIAGNOSIS — M1711 Unilateral primary osteoarthritis, right knee: Principal | ICD-10-CM | POA: Insufficient documentation

## 2023-12-24 DIAGNOSIS — I2510 Atherosclerotic heart disease of native coronary artery without angina pectoris: Secondary | ICD-10-CM | POA: Diagnosis not present

## 2023-12-24 DIAGNOSIS — Z79899 Other long term (current) drug therapy: Secondary | ICD-10-CM | POA: Diagnosis not present

## 2023-12-24 DIAGNOSIS — Z96651 Presence of right artificial knee joint: Principal | ICD-10-CM

## 2023-12-24 DIAGNOSIS — G8918 Other acute postprocedural pain: Secondary | ICD-10-CM | POA: Diagnosis not present

## 2023-12-24 HISTORY — PX: TOTAL KNEE ARTHROPLASTY: SHX125

## 2023-12-24 SURGERY — ARTHROPLASTY, KNEE, TOTAL
Anesthesia: Spinal | Site: Knee | Laterality: Right

## 2023-12-24 MED ORDER — LACTATED RINGERS IV SOLN: INTRAVENOUS | Status: DC

## 2023-12-24 MED ORDER — TRANEXAMIC ACID-NACL 1000-0.7 MG/100ML-% IV SOLN
1000.0000 mg | Freq: Once | INTRAVENOUS | Status: AC
  Administered 2023-12-24: 1000 mg via INTRAVENOUS
  Filled 2023-12-24: qty 100

## 2023-12-24 MED ORDER — HYDROCHLOROTHIAZIDE 25 MG PO TABS
25.0000 mg | ORAL_TABLET | Freq: Every day | ORAL | Status: DC
  Administered 2023-12-25 – 2023-12-26 (×2): 25 mg via ORAL
  Filled 2023-12-24 (×2): qty 1

## 2023-12-24 MED ORDER — CHLORHEXIDINE GLUCONATE 0.12 % MT SOLN
15.0000 mL | Freq: Once | OROMUCOSAL | Status: AC
  Administered 2023-12-24: 15 mL via OROMUCOSAL

## 2023-12-24 MED ORDER — FENTANYL CITRATE (PF) 50 MCG/ML IJ SOSY
PREFILLED_SYRINGE | INTRAMUSCULAR | Status: AC
  Filled 2023-12-24: qty 1

## 2023-12-24 MED ORDER — DEXAMETHASONE SOD PHOSPHATE PF 10 MG/ML IJ SOLN
INTRAMUSCULAR | Status: DC | PRN
  Administered 2023-12-24: 8 mg via INTRAVENOUS

## 2023-12-24 MED ORDER — ONDANSETRON HCL 4 MG PO TABS: 4.0000 mg | ORAL_TABLET | Freq: Three times a day (TID) | ORAL | 1 refills | Status: AC | PRN

## 2023-12-24 MED ORDER — MUPIROCIN 2 % EX OINT: 1.0000 | TOPICAL_OINTMENT | Freq: Two times a day (BID) | CUTANEOUS | 0 refills | Status: DC

## 2023-12-24 MED ORDER — ONDANSETRON HCL 4 MG/2ML IJ SOLN
INTRAMUSCULAR | Status: AC
Start: 2023-12-24 — End: 2023-12-24
  Filled 2023-12-24: qty 2

## 2023-12-24 MED ORDER — CEFAZOLIN SODIUM-DEXTROSE 2-4 GM/100ML-% IV SOLN
2.0000 g | INTRAVENOUS | Status: AC
  Administered 2023-12-24: 2 g via INTRAVENOUS
  Filled 2023-12-24: qty 100

## 2023-12-24 MED ORDER — FAMOTIDINE 20 MG PO TABS
20.0000 mg | ORAL_TABLET | Freq: Every day | ORAL | Status: DC
  Administered 2023-12-25 – 2023-12-26 (×2): 20 mg via ORAL
  Filled 2023-12-24 (×2): qty 1

## 2023-12-24 MED ORDER — EZETIMIBE 10 MG PO TABS
10.0000 mg | ORAL_TABLET | Freq: Every day | ORAL | Status: DC
  Administered 2023-12-25 – 2023-12-26 (×2): 10 mg via ORAL
  Filled 2023-12-24 (×2): qty 1

## 2023-12-24 MED ORDER — MUPIROCIN 2 % EX OINT: 1.0000 | TOPICAL_OINTMENT | Freq: Two times a day (BID) | CUTANEOUS | 0 refills | Status: AC

## 2023-12-24 MED ORDER — LIDOCAINE HCL (PF) 2 % IJ SOLN
INTRAMUSCULAR | Status: AC
Start: 2023-12-24 — End: 2023-12-24
  Filled 2023-12-24: qty 5

## 2023-12-24 MED ORDER — CHLORHEXIDINE GLUCONATE 4 % EX SOLN: 1.0000 | CUTANEOUS | 1 refills | Status: DC

## 2023-12-24 MED ORDER — SUGAMMADEX SODIUM 200 MG/2ML IV SOLN
INTRAVENOUS | Status: AC
  Filled 2023-12-24: qty 2

## 2023-12-24 MED ORDER — HYDROCODONE-ACETAMINOPHEN 5-325 MG PO TABS
1.0000 | ORAL_TABLET | ORAL | Status: DC | PRN
  Administered 2023-12-24 – 2023-12-26 (×8): 2 via ORAL
  Administered 2023-12-26: 1 via ORAL
  Administered 2023-12-26 (×2): 2 via ORAL
  Filled 2023-12-24 (×2): qty 2
  Filled 2023-12-24: qty 1
  Filled 2023-12-24 (×8): qty 2

## 2023-12-24 MED ORDER — ROSUVASTATIN CALCIUM 5 MG PO TABS
5.0000 mg | ORAL_TABLET | Freq: Every day | ORAL | Status: DC
Start: 2023-12-24 — End: 2023-12-26
  Administered 2023-12-24: 5 mg via ORAL
  Filled 2023-12-24 (×3): qty 1

## 2023-12-24 MED ORDER — MIDAZOLAM HCL (PF) 2 MG/2ML IJ SOLN: 1.0000 mg | Freq: Once | INTRAMUSCULAR | Status: AC

## 2023-12-24 MED ORDER — POLYETHYLENE GLYCOL 3350 17 G PO PACK
17.0000 g | PACK | Freq: Every day | ORAL | Status: DC | PRN
  Administered 2023-12-25: 17 g via ORAL
  Filled 2023-12-24: qty 1

## 2023-12-24 MED ORDER — ONDANSETRON HCL 4 MG/2ML IJ SOLN
INTRAMUSCULAR | Status: DC | PRN
Start: 2023-12-24 — End: 2023-12-24
  Administered 2023-12-24: 4 mg via INTRAVENOUS

## 2023-12-24 MED ORDER — ONDANSETRON HCL 4 MG/2ML IJ SOLN: 4.0000 mg | Freq: Four times a day (QID) | INTRAMUSCULAR | Status: DC | PRN

## 2023-12-24 MED ORDER — PHENOL 1.4 % MT LIQD: 1.0000 | OROMUCOSAL | Status: DC | PRN

## 2023-12-24 MED ORDER — TRANEXAMIC ACID-NACL 1000-0.7 MG/100ML-% IV SOLN
1000.0000 mg | INTRAVENOUS | Status: AC
  Administered 2023-12-24: 1000 mg via INTRAVENOUS
  Filled 2023-12-24: qty 100

## 2023-12-24 MED ORDER — ASPIRIN 81 MG PO CHEW
81.0000 mg | CHEWABLE_TABLET | Freq: Two times a day (BID) | ORAL | Status: DC
  Administered 2023-12-24 – 2023-12-26 (×4): 81 mg via ORAL
  Filled 2023-12-24 (×4): qty 1

## 2023-12-24 MED ORDER — ACETAMINOPHEN 10 MG/ML IV SOLN: 1000.0000 mg | Freq: Once | INTRAVENOUS | Status: DC | PRN

## 2023-12-24 MED ORDER — ONDANSETRON HCL 4 MG PO TABS: 4.0000 mg | ORAL_TABLET | Freq: Four times a day (QID) | ORAL | Status: DC | PRN

## 2023-12-24 MED ORDER — ALPRAZOLAM 0.5 MG PO TABS
0.5000 mg | ORAL_TABLET | Freq: Two times a day (BID) | ORAL | Status: DC | PRN
Start: 2023-12-24 — End: 2023-12-26
  Administered 2023-12-25: 0.5 mg via ORAL
  Filled 2023-12-24: qty 1

## 2023-12-24 MED ORDER — CEFAZOLIN SODIUM-DEXTROSE 2-4 GM/100ML-% IV SOLN
2.0000 g | Freq: Four times a day (QID) | INTRAVENOUS | Status: AC
  Administered 2023-12-24 (×2): 2 g via INTRAVENOUS
  Filled 2023-12-24 (×2): qty 100

## 2023-12-24 MED ORDER — LISINOPRIL 20 MG PO TABS
20.0000 mg | ORAL_TABLET | Freq: Every day | ORAL | Status: DC
  Administered 2023-12-25 – 2023-12-26 (×2): 20 mg via ORAL
  Filled 2023-12-24 (×2): qty 1

## 2023-12-24 MED ORDER — ROCURONIUM BROMIDE 10 MG/ML (PF) SYRINGE
PREFILLED_SYRINGE | INTRAVENOUS | Status: AC
  Filled 2023-12-24: qty 10

## 2023-12-24 MED ORDER — OYSTER SHELL CALCIUM/D3 500-5 MG-MCG PO TABS
1.0000 | ORAL_TABLET | Freq: Every day | ORAL | Status: DC
  Administered 2023-12-25 – 2023-12-26 (×2): 1 via ORAL
  Filled 2023-12-24 (×2): qty 1

## 2023-12-24 MED ORDER — PROPOFOL 500 MG/50ML IV EMUL
INTRAVENOUS | Status: DC | PRN
Start: 2023-12-24 — End: 2023-12-24
  Administered 2023-12-24: 75 ug/kg/min via INTRAVENOUS

## 2023-12-24 MED ORDER — RISAQUAD PO CAPS
1.0000 | ORAL_CAPSULE | Freq: Every day | ORAL | Status: DC
  Administered 2023-12-25: 1 via ORAL
  Filled 2023-12-24 (×3): qty 1

## 2023-12-24 MED ORDER — BLACK ELDERBERRY 50 MG/5ML PO SYRP: ORAL_SOLUTION | Freq: Every day | ORAL | Status: DC

## 2023-12-24 MED ORDER — POLYVINYL ALCOHOL 1.4 % OP SOLN: 1.0000 [drp] | Freq: Every day | OPHTHALMIC | Status: DC | PRN

## 2023-12-24 MED ORDER — MORPHINE SULFATE (PF) 2 MG/ML IV SOLN: 0.5000 mg | INTRAVENOUS | Status: DC | PRN

## 2023-12-24 MED ORDER — OMEGA-3-ACID ETHYL ESTERS 1 G PO CAPS
1.0000 g | ORAL_CAPSULE | Freq: Every day | ORAL | Status: DC
  Administered 2023-12-25: 1 g via ORAL
  Filled 2023-12-24 (×3): qty 1

## 2023-12-24 MED ORDER — RISEDRONATE SODIUM 5 MG PO TABS: 35.0000 mg | ORAL_TABLET | ORAL | Status: DC

## 2023-12-24 MED ORDER — GUAIFENESIN ER 600 MG PO TB12
600.0000 mg | ORAL_TABLET | Freq: Two times a day (BID) | ORAL | Status: DC | PRN
Start: 2023-12-24 — End: 2023-12-26

## 2023-12-24 MED ORDER — PROPOFOL 1000 MG/100ML IV EMUL
INTRAVENOUS | Status: AC
Start: 2023-12-24 — End: 2023-12-24
  Filled 2023-12-24: qty 100

## 2023-12-24 MED ORDER — DOCUSATE SODIUM 100 MG PO CAPS
100.0000 mg | ORAL_CAPSULE | Freq: Two times a day (BID) | ORAL | Status: DC
  Administered 2023-12-24 – 2023-12-26 (×4): 100 mg via ORAL
  Filled 2023-12-24 (×4): qty 1

## 2023-12-24 MED ORDER — METHOCARBAMOL 1000 MG/10ML IJ SOLN
500.0000 mg | Freq: Four times a day (QID) | INTRAMUSCULAR | Status: DC | PRN
Start: 2023-12-24 — End: 2023-12-26

## 2023-12-24 MED ORDER — OXYCODONE HCL 5 MG/5ML PO SOLN: 5.0000 mg | Freq: Once | ORAL | Status: DC | PRN

## 2023-12-24 MED ORDER — POTASSIUM CHLORIDE CRYS ER 20 MEQ PO TBCR
20.0000 meq | EXTENDED_RELEASE_TABLET | Freq: Two times a day (BID) | ORAL | Status: DC
  Administered 2023-12-24 – 2023-12-25 (×3): 20 meq via ORAL
  Filled 2023-12-24 (×4): qty 1

## 2023-12-24 MED ORDER — 0.9 % SODIUM CHLORIDE (POUR BTL) OPTIME
TOPICAL | Status: DC | PRN
  Administered 2023-12-24: 1000 mL

## 2023-12-24 MED ORDER — ROPIVACAINE HCL 5 MG/ML IJ SOLN
INTRAMUSCULAR | Status: DC | PRN
  Administered 2023-12-24: 20 mL via PERINEURAL

## 2023-12-24 MED ORDER — BUPIVACAINE IN DEXTROSE 0.75-8.25 % IT SOLN
INTRATHECAL | Status: DC | PRN
  Administered 2023-12-24: 1.6 mL via INTRATHECAL

## 2023-12-24 MED ORDER — SODIUM CHLORIDE 0.9 % IV SOLN: INTRAVENOUS | Status: AC

## 2023-12-24 MED ORDER — PREDNISONE 20 MG PO TABS: 20.0000 mg | ORAL_TABLET | Freq: Two times a day (BID) | ORAL | Status: DC

## 2023-12-24 MED ORDER — PAROXETINE HCL 10 MG PO TABS
20.0000 mg | ORAL_TABLET | Freq: Every day | ORAL | Status: DC
Start: 2023-12-25 — End: 2023-12-26
  Administered 2023-12-25 – 2023-12-26 (×2): 20 mg via ORAL
  Filled 2023-12-24 (×2): qty 2

## 2023-12-24 MED ORDER — CARBOXYMETHYLCELLULOSE SODIUM 0.5 % OP SOLN: 1.0000 [drp] | Freq: Every day | OPHTHALMIC | Status: DC

## 2023-12-24 MED ORDER — CALCIUM + D3 250-3 MG-MCG PO TABS: ORAL_TABLET | Freq: Every morning | ORAL | Status: DC

## 2023-12-24 MED ORDER — BUPIVACAINE LIPOSOME 1.3 % IJ SUSP
INTRAMUSCULAR | Status: AC
  Filled 2023-12-24: qty 20

## 2023-12-24 MED ORDER — PROSIGHT PO TABS
1.0000 | ORAL_TABLET | Freq: Every day | ORAL | Status: DC
  Administered 2023-12-25: 1 via ORAL
  Filled 2023-12-24 (×3): qty 1

## 2023-12-24 MED ORDER — LISINOPRIL-HYDROCHLOROTHIAZIDE 20-25 MG PO TABS: 1.0000 | ORAL_TABLET | Freq: Every day | ORAL | Status: DC

## 2023-12-24 MED ORDER — ORAL CARE MOUTH RINSE: 15.0000 mL | Freq: Once | OROMUCOSAL | Status: AC

## 2023-12-24 MED ORDER — ASPIRIN 81 MG PO TABS: 81.0000 mg | ORAL_TABLET | Freq: Two times a day (BID) | ORAL | 0 refills | Status: DC

## 2023-12-24 MED ORDER — BUPIVACAINE-EPINEPHRINE (PF) 0.25% -1:200000 IJ SOLN
INTRAMUSCULAR | Status: AC
  Filled 2023-12-24: qty 30

## 2023-12-24 MED ORDER — BUPIVACAINE LIPOSOME 1.3 % IJ SUSP: 20.0000 mL | Freq: Once | INTRAMUSCULAR | Status: AC

## 2023-12-24 MED ORDER — METHOCARBAMOL 500 MG PO TABS
500.0000 mg | ORAL_TABLET | Freq: Four times a day (QID) | ORAL | Status: DC | PRN
Start: 2023-12-24 — End: 2023-12-26
  Administered 2023-12-24 – 2023-12-26 (×5): 500 mg via ORAL
  Filled 2023-12-24 (×5): qty 1

## 2023-12-24 MED ORDER — PROPOFOL 10 MG/ML IV BOLUS
INTRAVENOUS | Status: AC
  Filled 2023-12-24: qty 20

## 2023-12-24 MED ORDER — POVIDONE-IODINE 10 % EX SWAB: 2.0000 | Freq: Once | CUTANEOUS | Status: AC

## 2023-12-24 MED ORDER — LORATADINE 10 MG PO TABS
10.0000 mg | ORAL_TABLET | Freq: Every day | ORAL | Status: DC
  Administered 2023-12-25: 10 mg via ORAL
  Filled 2023-12-24 (×2): qty 1

## 2023-12-24 MED ORDER — LEVOTHYROXINE SODIUM 50 MCG PO TABS
50.0000 ug | ORAL_TABLET | Freq: Every day | ORAL | Status: DC
  Administered 2023-12-25 – 2023-12-26 (×2): 50 ug via ORAL
  Filled 2023-12-24 (×2): qty 1

## 2023-12-24 MED ORDER — METOCLOPRAMIDE HCL 5 MG PO TABS: 5.0000 mg | ORAL_TABLET | Freq: Three times a day (TID) | ORAL | Status: DC | PRN

## 2023-12-24 MED ORDER — SODIUM CHLORIDE (PF) 0.9 % IJ SOLN
INTRAMUSCULAR | Status: AC
  Filled 2023-12-24: qty 50

## 2023-12-24 MED ORDER — OXYCODONE HCL 5 MG PO TABS: 5.0000 mg | ORAL_TABLET | Freq: Once | ORAL | Status: DC | PRN

## 2023-12-24 MED ORDER — PROPOFOL 10 MG/ML IV BOLUS
INTRAVENOUS | Status: DC | PRN
Start: 2023-12-24 — End: 2023-12-24
  Administered 2023-12-24: 30 mg via INTRAVENOUS

## 2023-12-24 MED ORDER — ASPIRIN 81 MG PO TABS: 81.0000 mg | ORAL_TABLET | Freq: Two times a day (BID) | ORAL | 0 refills | Status: AC

## 2023-12-24 MED ORDER — DROPERIDOL 2.5 MG/ML IJ SOLN: 0.6250 mg | Freq: Once | INTRAMUSCULAR | Status: DC | PRN

## 2023-12-24 MED ORDER — SODIUM CHLORIDE 0.9 % IR SOLN
Status: DC | PRN
  Administered 2023-12-24: 1000 mL

## 2023-12-24 MED ORDER — AMLODIPINE BESYLATE 5 MG PO TABS
2.5000 mg | ORAL_TABLET | Freq: Every day | ORAL | Status: DC
  Administered 2023-12-25 – 2023-12-26 (×2): 2.5 mg via ORAL
  Filled 2023-12-24 (×2): qty 1

## 2023-12-24 MED ORDER — CHLORHEXIDINE GLUCONATE 4 % EX SOLN: 1.0000 | CUTANEOUS | 1 refills | Status: AC

## 2023-12-24 MED ORDER — MENTHOL 3 MG MT LOZG: 1.0000 | LOZENGE | OROMUCOSAL | Status: DC | PRN

## 2023-12-24 MED ORDER — SODIUM CHLORIDE 0.9 % IV SOLN
INTRAVENOUS | Status: DC | PRN
  Administered 2023-12-24: 80 mL

## 2023-12-24 MED ORDER — ONDANSETRON HCL 4 MG PO TABS: 4.0000 mg | ORAL_TABLET | Freq: Three times a day (TID) | ORAL | 1 refills | Status: DC | PRN

## 2023-12-24 MED ORDER — FENTANYL CITRATE (PF) 50 MCG/ML IJ SOSY
25.0000 ug | PREFILLED_SYRINGE | INTRAMUSCULAR | Status: DC | PRN
  Administered 2023-12-24: 50 ug via INTRAVENOUS

## 2023-12-24 MED ORDER — FENTANYL CITRATE (PF) 50 MCG/ML IJ SOSY
50.0000 ug | PREFILLED_SYRINGE | Freq: Once | INTRAMUSCULAR | Status: AC
  Administered 2023-12-24: 50 ug via INTRAVENOUS
  Filled 2023-12-24: qty 2

## 2023-12-24 MED ORDER — PHENYLEPHRINE HCL-NACL 20-0.9 MG/250ML-% IV SOLN
INTRAVENOUS | Status: DC | PRN
  Administered 2023-12-24: 30 ug/min via INTRAVENOUS

## 2023-12-24 MED ORDER — ACETAMINOPHEN 325 MG PO TABS: 325.0000 mg | ORAL_TABLET | Freq: Four times a day (QID) | ORAL | Status: DC | PRN

## 2023-12-24 MED ORDER — OCUVITE ADULT 50+ PO CAPS: ORAL_CAPSULE | Freq: Every day | ORAL | Status: DC

## 2023-12-24 MED ORDER — METOCLOPRAMIDE HCL 5 MG/ML IJ SOLN: 5.0000 mg | Freq: Three times a day (TID) | INTRAMUSCULAR | Status: DC | PRN

## 2023-12-24 SURGICAL SUPPLY — 44 items
ATTUNE PSFEM RTSZ4 NARCEM KNEE (Femur) IMPLANT
ATTUNE PSRP INSR SZ4 12 KNEE (Insert) IMPLANT
BAG COUNTER SPONGE SURGICOUNT (BAG) IMPLANT
BAG ZIPLOCK 12X15 (MISCELLANEOUS) ×2 IMPLANT
BASEPLATE TIBIAL ROTATING SZ 4 (Knees) IMPLANT
BLADE SAG 18X100X1.27 (BLADE) ×2 IMPLANT
BLADE SAW SGTL 13X75X1.27 (BLADE) ×2 IMPLANT
BNDG ELASTIC 6X10 VLCR STRL LF (GAUZE/BANDAGES/DRESSINGS) ×2 IMPLANT
BNDG GAUZE DERMACEA FLUFF 4 (GAUZE/BANDAGES/DRESSINGS) ×2 IMPLANT
BOWL SMART MIX CTS (DISPOSABLE) ×2 IMPLANT
CEMENT HV SMART SET (Cement) ×4 IMPLANT
COVER SURGICAL LIGHT HANDLE (MISCELLANEOUS) ×2 IMPLANT
CUFF TRNQT CYL 34X4.125X (TOURNIQUET CUFF) ×2 IMPLANT
DRAPE SHEET LG 3/4 BI-LAMINATE (DRAPES) ×2 IMPLANT
DRAPE U-SHAPE 47X51 STRL (DRAPES) ×2 IMPLANT
DRSG ADAPTIC 3X8 NADH LF (GAUZE/BANDAGES/DRESSINGS) ×2 IMPLANT
DURAPREP 26ML APPLICATOR (WOUND CARE) ×2 IMPLANT
ELECT PENCIL ROCKER SW 15FT (MISCELLANEOUS) ×2 IMPLANT
ELECT REM PT RETURN 15FT ADLT (MISCELLANEOUS) ×2 IMPLANT
GAUZE PAD ABD 8X10 STRL (GAUZE/BANDAGES/DRESSINGS) ×2 IMPLANT
GAUZE SPONGE 4X4 12PLY STRL (GAUZE/BANDAGES/DRESSINGS) ×2 IMPLANT
GLOVE BIOGEL PI IND STRL 7.5 (GLOVE) ×2 IMPLANT
GLOVE BIOGEL PI IND STRL 8.5 (GLOVE) ×2 IMPLANT
GLOVE ORTHO TXT STRL SZ7.5 (GLOVE) ×2 IMPLANT
GLOVE SURG ORTHO 8.5 STRL (GLOVE) ×2 IMPLANT
GOWN STRL REUS W/ TWL XL LVL3 (GOWN DISPOSABLE) ×4 IMPLANT
IMMOBILIZER KNEE 20 THIGH 36 (SOFTGOODS) ×2 IMPLANT
KIT TURNOVER KIT A (KITS) ×2 IMPLANT
MANIFOLD NEPTUNE II (INSTRUMENTS) ×2 IMPLANT
NS IRRIG 1000ML POUR BTL (IV SOLUTION) ×2 IMPLANT
PACK TOTAL KNEE CUSTOM (KITS) ×2 IMPLANT
PATELLA MEDIAL ATTUN 35MM KNEE (Knees) IMPLANT
PIN STEINMAN FIXATION KNEE (PIN) IMPLANT
PROTECTOR NERVE ULNAR (MISCELLANEOUS) ×2 IMPLANT
SET HNDPC FAN SPRY TIP SCT (DISPOSABLE) ×2 IMPLANT
STRIP CLOSURE SKIN 1/2X4 (GAUZE/BANDAGES/DRESSINGS) ×4 IMPLANT
SUT MNCRL AB 3-0 PS2 18 (SUTURE) ×2 IMPLANT
SUT VIC AB 0 CT1 36 (SUTURE) ×2 IMPLANT
SUT VIC AB 1 CT1 36 (SUTURE) ×4 IMPLANT
SUT VIC AB 2-0 CT1 TAPERPNT 27 (SUTURE) ×2 IMPLANT
TOWEL GREEN STERILE FF (TOWEL DISPOSABLE) ×2 IMPLANT
TRAY CATH INTERMITTENT SS 16FR (CATHETERS) ×2 IMPLANT
WATER STERILE IRR 1000ML POUR (IV SOLUTION) ×4 IMPLANT
YANKAUER SUCT BULB TIP NO VENT (SUCTIONS) ×2 IMPLANT

## 2023-12-24 NOTE — TOC Transition Note (Signed)
 Transition of Care [facility]) - Discharge Note   Patient Details  Name: Vanessa Stewart MRN: [id number] Date of Birth: [birth year]  Transition [facility]) CM/SW Contact:  NORMAN ASPEN, LCSW Phone Number: 12/24/2023, 1:50 PM   Clinical Narrative:     Met with pt who confirms she has needed DME in the home.  OPPT already arranged with [facility].  No further IP CM needs.  Final next level of care: OP Rehab Barriers to Discharge: No Barriers Identified   Patient Goals and CMS Choice Patient states their goals for this hospitalization and ongoing recovery are:: return home          Discharge Placement                       Discharge Plan and Services Additional resources added to the After Visit Summary for                  DME Arranged: N/A                    Social Drivers of Health (SDOH) Interventions SDOH Screenings   Food Insecurity: No Food Insecurity (12/24/2023)  Housing: Low Risk  (12/24/2023)  Transportation Needs: No Transportation Needs (12/24/2023)  Utilities: Not At Risk (12/24/2023)  Financial Resource Strain: Low Risk (07/01/2022)   Received from [facility]  Physical Activity: Inactive (07/01/2022)   Received from [facility]  Social Connections: Moderately Integrated (12/24/2023)  Stress: No Stress Concern Present (07/01/2022)   Received from [facility]  Tobacco Use: Low Risk  (12/16/2023)  Health Literacy: Adequate Health Literacy (07/01/2022)   Received from [facility]     Readmission Risk Interventions     No data to display

## 2023-12-24 NOTE — Evaluation (Signed)
 Physical Therapy Evaluation Patient Details Name: Vanessa Stewart MRN: [id number] DOB: 07-Mar-1945 Today's Date: 12/24/2023  History of Present Illness  Pt R TKR  and with hx of CAD, RBBB, essential tremor, carotid artery stenosis, and R RTSR (2/25)  Clinical Impression  Pt s/p R TKR and presents with decreased R LE strength/ROM and post op pain limiting functional mobility.  Pt motivated and should progress to dc home with assist of dtrs.  Pt reports first OP PT scheduled for 12/27/23.        If plan is discharge home, recommend the following: A little help with walking and/or transfers;A little help with bathing/dressing/bathroom;Assistance with cooking/housework;Assist for transportation;Help with stairs or ramp for entrance   Can travel by private vehicle        Equipment Recommendations None recommended by PT  Recommendations for Other Services       Functional Status Assessment Patient has had a recent decline in their functional status and demonstrates the ability to make significant improvements in function in a reasonable and predictable amount of time.     Precautions / Restrictions Precautions Precautions: Knee;Fall Restrictions Weight Bearing Restrictions Per Provider Order: Yes RLE Weight Bearing Per Provider Order: Weight bearing as tolerated      Mobility  Bed Mobility Overal bed mobility: Needs Assistance Bed Mobility: Supine to Sit     Supine to sit: Contact guard     General bed mobility comments: sequence cues with CGA for safety    Transfers Overall transfer level: Needs assistance Equipment used: Rolling walker (2 wheels) Transfers: Sit to/from Stand Sit to Stand: Min assist           General transfer comment: cues for LE management and use of UEs to self assist    Ambulation/Gait Ambulation/Gait assistance: Min assist Gait Distance (Feet): 10 Feet Assistive device: Rolling walker (2 wheels) Gait Pattern/deviations: Decreased step  length - right, Decreased step length - left, Shuffle, Trunk flexed Gait velocity: decr     General Gait Details: cues for sequence, posture and position from [employer]            Wheelchair Mobility     Tilt Bed    Modified Rankin (Stroke Patients Only)       Balance Overall balance assessment: Needs assistance Sitting-balance support: No upper extremity supported, Feet supported Sitting balance-Leahy Scale: Good     Standing balance support: Bilateral upper extremity supported Standing balance-Leahy Scale: Poor                               Pertinent Vitals/Pain Pain Assessment Pain Assessment: 0-10 Pain Score: 9  Pain Descriptors / Indicators: Aching, Grimacing, Sore Pain Intervention(s): Limited activity within patient's tolerance, Monitored during session, Premedicated before session, Ice applied    Home Living Family/patient expects to be discharged to:: Private residence Living Arrangements: Alone Available Help at Discharge: Family;Available 24 hours/day Type of Home: House Home Access: Stairs to enter   [employer] of Steps: 1   Home Layout: One level Home Equipment: Cane - single point      Prior Function Prior Level of Function : Independent/Modified Independent;Driving                     Extremity/Trunk Assessment   Upper Extremity Assessment Upper Extremity Assessment: Overall WFL for tasks assessed    Lower Extremity Assessment Lower Extremity Assessment: RLE deficits/detail RLE: Unable to fully  assess due to pain    Cervical / Trunk Assessment Cervical / Trunk Assessment: Kyphotic  Communication   Communication Communication: No apparent difficulties    Cognition Arousal: Alert Behavior During Therapy: WFL for tasks assessed/performed, Impulsive   PT - Cognitive impairments: Safety/Judgement                         Following commands: Impaired Following commands impaired:  Follows one step commands inconsistently     Cueing Cueing Techniques: Gestural cues, Verbal cues, Tactile cues     General Comments      Exercises Total Joint Exercises Ankle Circles/Pumps: AROM, Both, 15 reps, Supine   Assessment/Plan    PT Assessment Patient needs continued PT services  PT Problem List Decreased strength;Decreased range of motion;Decreased activity tolerance;Decreased balance;Decreased mobility;Decreased knowledge of use of DME;Pain       PT Treatment Interventions DME instruction;Gait training;Stair training;Functional mobility training;Therapeutic activities;Therapeutic exercise;Patient/family education    PT Goals (Current goals can be found in the Care Plan section)  Acute Rehab PT Goals Patient Stated Goal: Regain IND PT Goal Formulation: With patient Time For Goal Achievement: 12/24/23 Potential to Achieve Goals: Good    Frequency 7X/week     Co-evaluation               AM-PAC PT 6 Clicks Mobility  Outcome Measure Help needed turning from your back to your side while in a flat bed without using bedrails?: A Little Help needed moving from lying on your back to sitting on the side of a flat bed without using bedrails?: A Little Help needed moving to and from a bed to a chair (including a wheelchair)?: A Little Help needed standing up from a chair using your arms (e.g., wheelchair or bedside chair)?: A Little Help needed to walk in hospital room?: A Lot Help needed climbing 3-5 steps with a railing? : A Lot 6 Click Score: 16    End of Session Equipment Utilized During Treatment: Gait belt Activity Tolerance: Patient tolerated treatment well;Patient limited by pain Patient left: in chair;with call bell/phone within reach;with chair alarm set Nurse Communication: Mobility status PT Visit Diagnosis: Unsteadiness on feet (R26.81);Difficulty in walking, not elsewhere classified (R26.2);Pain Pain - Right/Left: Right Pain - part of body:  Knee    Time: 8460-8442 PT Time Calculation (min) (ACUTE ONLY): 18 min   Charges:   PT Evaluation $PT Eval Low Complexity: 1 Low   PT General Charges $$ ACUTE PT VISIT: 1 Visit         [facility] PT Acute Rehabilitation Services Office [phone]   Jenelle Drennon 12/24/2023, 4:06 PM

## 2023-12-24 NOTE — Anesthesia Procedure Notes (Signed)
 Spinal  Patient location during procedure: OR Start time: 12/24/2023 9:36 AM Reason for block: surgical anesthesia Staffing Performed: resident/CRNA  Anesthesiologist: Erma Thom SAUNDERS, MD Resident/CRNA: Metta Andrea NOVAK, CRNA Performed by: Metta Andrea NOVAK, CRNA Authorized by: Erma Thom SAUNDERS, MD   Preanesthetic Checklist Completed: patient identified, IV checked, site marked, risks and benefits discussed, surgical consent, monitors and equipment checked, pre-op evaluation and timeout performed Spinal Block Patient position: sitting Prep: DuraPrep and site prepped and draped Patient monitoring: heart rate, cardiac monitor, continuous pulse ox and blood pressure Approach: midline Location: L3-4 Injection technique: single-shot Needle Needle type: Pencan  Needle gauge: 24 G Needle length: 10 cm Assessment Events: CSF return Additional Notes Pt placed in sitting position, spinal kit expiration date checked and verified, timeout, + CSF, - heme, pt tolerated well. Dr Erma present and supervising throughout SAB placement. Adequate sensory level.

## 2023-12-24 NOTE — Discharge Instructions (Signed)
 Ice to the knee constantly.  Keep the incision covered and clean and dry for one week, then ok to remove the bandage and leave open to air.   Do exercise as instructed every hour, please to prevent stiffness.    DO NOT prop anything under the knee, it will make your knee stiff.  Prop under the ankle to encourage your knee to go straight.   Use the walker while you are up and around for balance.  Wear your support stockings 24/7 to prevent blood clots and take baby aspirin  twice daily for 30 days also to prevent blood clots  Follow up with Dr Kay in two weeks in the office, call [phone] for appt  Please call Dr Kay (cell) [phone] with any questions or concerns  INSTRUCTIONS AFTER JOINT REPLACEMENT   Remove items at home which could result in a fall. This includes throw rugs or furniture in walking pathways ICE to the affected joint every three hours while awake for 30 minutes at a time, for at least the first 3-5 days, and then as needed for pain and swelling.  Continue to use ice for pain and swelling. You may notice swelling that will progress down to the foot and ankle.  This is normal after surgery.  Elevate your leg when you are not up walking on it.   Continue to use the breathing machine you got in the hospital (incentive spirometer) which will help keep your temperature down.  It is common for your temperature to cycle up and down following surgery, especially at night when you are not up moving around and exerting yourself.  The breathing machine keeps your lungs expanded and your temperature down.   DIET:  As you were doing prior to hospitalization, we recommend a well-balanced diet.  DRESSING / WOUND CARE / SHOWERING  Leave the Aquacel bandage on for one week, then ok to remove the bandage. May shower with Aquacel bandage on (waterproof)  ACTIVITY  Increase activity slowly as tolerated, but follow the weight bearing instructions below.   No driving for 6 weeks or  until further direction given by your physician.  You cannot drive while taking narcotics.  No lifting or carrying greater than 10 lbs. until further directed by your surgeon. Avoid periods of inactivity such as sitting longer than an hour when not asleep. This helps prevent blood clots.  You may return to work once you are authorized by your doctor.     WEIGHT BEARING   Weight bearing as tolerated with assist device (walker, cane, etc) as directed, use it as long as suggested by your surgeon or therapist, typically at least 4-6 weeks.   EXERCISES  Results after joint replacement surgery are often greatly improved when you follow the exercise, range of motion and muscle strengthening exercises prescribed by your doctor. Safety measures are also important to protect the joint from further injury. Any time any of these exercises cause you to have increased pain or swelling, decrease what you are doing until you are comfortable again and then slowly increase them. If you have problems or questions, call your caregiver or physical therapist for advice.   Rehabilitation is important following a joint replacement. After just a few days of immobilization, the muscles of the leg can become weakened and shrink (atrophy).  These exercises are designed to build up the tone and strength of the thigh and leg muscles and to improve motion. Often times heat used for twenty to thirty  minutes before working out will loosen up your tissues and help with improving the range of motion but do not use heat for the first two weeks following surgery (sometimes heat can increase post-operative swelling).   These exercises can be done on a training (exercise) mat, on the floor, on a table or on a bed. Use whatever works the best and is most comfortable for you.    Use music or television while you are exercising so that the exercises are a pleasant break in your day. This will make your life better with the exercises acting  as a break in your routine that you can look forward to.   Perform all exercises about fifteen times, three times per day or as directed.  You should exercise both the operative leg and the other leg as well.  Exercises include:   Quad Sets - Tighten up the muscle on the front of the thigh (Quad) and hold for 5-10 seconds.   Straight Leg Raises - With your knee straight (if you were given a brace, keep it on), lift the leg to 60 degrees, hold for 3 seconds, and slowly lower the leg.  Perform this exercise against resistance later as your leg gets stronger.  Leg Slides: Lying on your back, slowly slide your foot toward your buttocks, bending your knee up off the floor (only go as far as is comfortable). Then slowly slide your foot back down until your leg is flat on the floor again.  Angel Wings: Lying on your back spread your legs to the side as far apart as you can without causing discomfort.  Hamstring Strength:  Lying on your back, push your heel against the floor with your leg straight by tightening up the muscles of your buttocks.  Repeat, but this time bend your knee to a comfortable angle, and push your heel against the floor.  You may put a pillow under the heel to make it more comfortable if necessary.   A rehabilitation program following joint replacement surgery can speed recovery and prevent re-injury in the future due to weakened muscles. Contact your doctor or a physical therapist for more information on knee rehabilitation.    CONSTIPATION  Constipation is defined medically as fewer than three stools per week and severe constipation as less than one stool per week.  Even if you have a regular bowel pattern at home, your normal regimen is likely to be disrupted due to multiple reasons following surgery.  Combination of anesthesia, postoperative narcotics, change in appetite and fluid intake all can affect your bowels.   YOU MUST use at least one of the following options; they are  listed in order of increasing strength to get the job done.  They are all available over the counter, and you may need to use some, POSSIBLY even all of these options:    Drink plenty of fluids (prune juice may be helpful) and high fiber foods Colace 100 mg by mouth twice a day  Senokot for constipation as directed and as needed Dulcolax (bisacodyl), take with full glass of water   Miralax (polyethylene glycol) once or twice a day as needed.  If you have tried all these things and are unable to have a bowel movement in the first 3-4 days after surgery call either your surgeon or your primary doctor.    If you experience loose stools or diarrhea, hold the medications until you stool forms back up.  If your symptoms do not get better within  1 week or if they get worse, check with your doctor.  If you experience the worst abdominal pain ever or develop nausea or vomiting, please contact the office immediately for further recommendations for treatment.   ITCHING:  If you experience itching with your medications, try taking only a single pain pill, or even half a pain pill at a time.  You can also use Benadryl over the counter for itching or also to help with sleep.   TED HOSE STOCKINGS:  Use stockings on both legs until for at least 2 weeks or as directed by physician office. They may be removed at night for sleeping.  MEDICATIONS:  See your medication summary on the "After Visit Summary" that nursing will review with you.  You may have some home medications which will be placed on hold until you complete the course of blood thinner medication.  It is important for you to complete the blood thinner medication as prescribed.  PRECAUTIONS:  If you experience chest pain or shortness of breath - call 911 immediately for transfer to the hospital emergency department.   If you develop a fever greater that 101 F, purulent drainage from wound, increased redness or drainage from wound, foul odor from the  wound/dressing, or calf pain - CONTACT YOUR SURGEON.                                                   FOLLOW-UP APPOINTMENTS:  If you do not already have a post-op appointment, please call the office for an appointment to be seen by your surgeon.  Guidelines for how soon to be seen are listed in your "After Visit Summary", but are typically between 1-4 weeks after surgery.  OTHER INSTRUCTIONS:   Knee Replacement:  Do not place pillow under knee, focus on keeping the knee straight while resting. CPM instructions: 0-90 degrees, 2 hours in the morning, 2 hours in the afternoon, and 2 hours in the evening. Place foam block, curve side up under heel at all times except when in CPM or when walking.  DO NOT modify, tear, cut, or change the foam block in any way.  POST-OPERATIVE OPIOID TAPER INSTRUCTIONS: It is important to wean off of your opioid medication as soon as possible. If you do not need pain medication after your surgery it is ok to stop day one. Opioids include: Codeine, Hydrocodone (Norco, Vicodin), Oxycodone(Percocet, oxycontin) and hydromorphone amongst others.  Long term and even short term use of opiods can cause: Increased pain response Dependence Constipation Depression Respiratory depression And more.  Withdrawal symptoms can include Flu like symptoms Nausea, vomiting And more Techniques to manage these symptoms Hydrate well Eat regular healthy meals Stay active Use relaxation techniques(deep breathing, meditating, yoga) Do Not substitute Alcohol to help with tapering If you have been on opioids for less than two weeks and do not have pain than it is ok to stop all together.  Plan to wean off of opioids This plan should start within one week post op of your joint replacement. Maintain the same interval or time between taking each dose and first decrease the dose.  Cut the total daily intake of opioids by one tablet each day Next start to increase the time between  doses. The last dose that should be eliminated is the evening dose.   MAKE SURE YOU:  Understand  these instructions.  Get help right away if you are not doing well or get worse.    Thank you for letting us  be a part of your medical care team.  It is a privilege we respect greatly.  We hope these instructions will help you stay on track for a fast and full recovery!

## 2023-12-24 NOTE — Transfer of Care (Signed)
 Immediate Anesthesia Transfer of Care Note  Patient: Vanessa Stewart  Procedure(s) Performed: ARTHROPLASTY, KNEE, TOTAL (Right: Knee)  Patient Location: PACU  Anesthesia Type:Spinal  Level of Consciousness: awake, alert , and patient cooperative  Airway & Oxygen Therapy: Patient Spontanous Breathing and Patient connected to face mask oxygen  Post-op Assessment: Report given to RN and Post -op Vital signs reviewed and stable  Post vital signs: Reviewed and stable  Last Vitals:  Vitals Value Taken Time  BP 130/63   Temp    Pulse 92 12/24/23 11:20  Resp 17 12/24/23 11:20  SpO2 99 % 12/24/23 11:20  Vitals shown include unfiled device data.  Last Pain:  Vitals:   12/24/23 0858  TempSrc:   PainSc: 0-No pain         Complications: No notable events documented.

## 2023-12-24 NOTE — Care Plan (Signed)
 Ortho Bundle Case Management Note  Patient Details  Name: Vanessa Stewart MRN: [id number] Date of Birth: October 08, 1945                  R TKA on 12/24/23.  DCP: Home with daughter.  DME: No needs. Borrowing RW.  PT: EO   DME Arranged:  N/A DME Agency:       Additional Comments: Please contact me with any questions of if this plan should need to change.    Lyle Pepper, CCM Case Manager, [facility] [phone] ext [phone] 12/24/2023, 8:40 AM

## 2023-12-24 NOTE — Progress Notes (Addendum)
 PHARMACIST - PHYSICIAN COMMUNICATION  CONCERNING: P&T Medication Policy Regarding Oral Bisphosphonates  RECOMMENDATION: Your order for alendronate (Fosamax), ibandronate (Boniva), or risedronate (Actonel) has been discontinued at this time.  If the patient's post-hospital medical condition warrants safe use of this class of drugs, please resume the pre-hospital regimen upon discharge.  DESCRIPTION:  Alendronate (Fosamax), ibandronate (Boniva), and risedronate (Actonel) can cause severe esophageal erosions in patients who are unable to remain upright at least 30 minutes after taking this medication.   Since brief interruptions in therapy are thought to have minimal impact on bone mineral density, the Pharmacy & Therapeutics Committee has established that bisphosphonate orders should be routinely discontinued during hospitalization.   To override this safety policy and permit administration of Boniva, Fosamax, or Actonel in the hospital, prescribers must write "DO NOT HOLD" in the comments section when placing the order for this class of medications.    PHARMACIST - PHYSICIAN ORDER COMMUNICATION  CONCERNING: P&T Medication Policy on Herbal Medications  DESCRIPTION:  This patient's order for:  Stevie  has been noted.  This product(s) is classified as an "herbal" or natural product. Due to a lack of definitive safety studies or FDA approval, nonstandard manufacturing practices, plus the potential risk of unknown drug-drug interactions while on inpatient medications, the Pharmacy and Therapeutics Committee does not permit the use of "herbal" or natural products of this type within [facility].   ACTION TAKEN: The pharmacy department is unable to verify this order at this time and your patient has been informed of this safety policy. Please reevaluate patient's clinical condition at discharge and address if the herbal or natural product(s) should be resumed at that time.  Katalea Ucci  Karoline Marina, PharmD, BCPS Clinical Staff Pharmacist

## 2023-12-24 NOTE — Anesthesia Procedure Notes (Signed)
 Procedure Name: MAC Date/Time: 12/24/2023 9:30 AM  Performed by: Metta Andrea NOVAK, CRNAPre-anesthesia Checklist: Patient identified, Emergency Drugs available, Suction available, Patient being monitored and Timeout performed Oxygen Delivery Method: Simple face mask Placement Confirmation: positive ETCO2

## 2023-12-24 NOTE — Brief Op Note (Signed)
 12/24/2023  11:26 AM  PATIENT:  Vanessa Stewart  78 y.o. female  PRE-OPERATIVE DIAGNOSIS:  Arthritis of right knee  POST-OPERATIVE DIAGNOSIS:  Arthritis of right knee  PROCEDURE:  Procedure(s): ARTHROPLASTY, KNEE, TOTAL (Right) DePuy Attune  SURGEON:  Surgeons and Role:    DEWAINE Kay Kemps, MD - Primary  PHYSICIAN ASSISTANT:   ASSISTANTS: Debby KATHEE Fireman, PA-C   ANESTHESIA:   regional and spinal  EBL:  50 mL   BLOOD ADMINISTERED:none  DRAINS: none   LOCAL MEDICATIONS USED:  MARCAINE      SPECIMEN:  No Specimen  DISPOSITION OF SPECIMEN:  N/A  COUNTS:  YES  TOURNIQUET:   Total Tourniquet Time Documented: Thigh (Right) - 74 minutes Total: Thigh (Right) - 74 minutes   DICTATION: .Other Dictation: Dictation Number [id number]  PLAN OF CARE: Admit for overnight observation  PATIENT DISPOSITION:  PACU - hemodynamically stable.   Delay start of Pharmacological VTE agent (>24hrs) due to surgical blood loss or risk of bleeding: no

## 2023-12-24 NOTE — Interval H&P Note (Signed)
 History and Physical Interval Note:  12/24/2023 8:42 AM  Rollene LITTIE Epley  has presented today for surgery, with the diagnosis of Arthritis of right knee.  The various methods of treatment have been discussed with the patient and family. After consideration of risks, benefits and other options for treatment, the patient has consented to  Procedure(s): ARTHROPLASTY, KNEE, TOTAL (Right) as a surgical intervention.  The patient's history has been reviewed, patient examined, no change in status, stable for surgery.  I have reviewed the patient's chart and labs.  Questions were answered to the patient's satisfaction.     Elspeth JONELLE Her

## 2023-12-24 NOTE — Op Note (Signed)
 Vanessa Stewart, Vanessa Stewart MEDICAL RECORD NO: [id number] ACCOUNT NO: [id number] DATE OF BIRTH: [birth year] FACILITY: [facility] LOCATION: [facility] PHYSICIAN: Elspeth SAUNDERS. Kay, MD  Operative Report   DATE OF PROCEDURE: 12/24/2023  PREOPERATIVE DIAGNOSIS: Right knee end-stage arthritis.  POSTOPERATIVE DIAGNOSIS: Right knee end-stage arthritis.  PROCEDURE PERFORMED: Right total knee arthroplasty using DePuy Attune prosthesis.  ATTENDING SURGEON: Elspeth SAUNDERS. Kay, MD  ASSISTANT: Debby Crock [place], [place], who was scrubbed during the entire procedure, and necessary for satisfactory completion of surgery.  ANESTHESIA: Spinal anesthesia plus adductor canal block was utilized.  ESTIMATED BLOOD LOSS: Minimal.  FLUID REPLACEMENT: 1000 mL crystalloid.  COUNTS: Instrument count was correct.  COMPLICATIONS: There were no complications.  ANTIBIOTICS: Perioperative antibiotics were given.  INDICATIONS: The patient is a 78 year old female who presents with a history of worsening right knee pain due to end-stage osteoarthritis, bone-on-bone. The patient has failed conservative management and desires operative treatment to eliminate pain and  restore function. Informed consent was obtained.  DESCRIPTION OF PROCEDURE: After an adequate level of spinal anesthesia was achieved plus the adductor canal block placed, a nonsterile tourniquet was placed on the right proximal thigh. The right leg was sterilely prepped and draped in the usual manner.  Timeout called verifying correct patient and correct site. We elevated the leg and exsanguinated with an Esmarch bandage inflated to 250 mmHg. We then placed the knee in flexion and performed a longitudinal midline incision with a 10-blade scalpel. We  used a fresh 10-blade scalpel for the medial parapatellar arthrotomy. We then divided lateral patellofemoral ligaments, everting the patella and exposing the distal femur, which was devoid of cartilage. We entered the  distal femur with a step-cut drill.  We then placed an intramedullary resection guide set on 5 degrees of valgus and 9 mm of resection. We performed our distal cut with the oscillating saw. We then sized the femur to size 4 anterior down, performing anterior, posterior, and chamfer cuts  with a 4-in-1 block. Next, we removed ACL, PCL, meniscal tissue. We subluxed the tibia anteriorly and then cut the tibia using the external jig with 2 mm off the affected medial side, set on minimal slope for this posterior cruciate substituting  prosthesis. We had a perpendicular cut to the long axis of the tibia. Once we had our proximal tibia cut done, we used a laminar spreader and removed posterior femoral condyle osteophytes. We also released the posterior capsule. We then injected the  posterior capsule with a combination of Marcaine , Exparel  and saline. Next, we checked our gaps, which were symmetric at 6 mm. We removed our pins from the tibia and completed our tibial preparation for the size 4 tibia with a modular drill and keel  punch. We next went to the femur and cut the box for the 4 narrow right femur. We trialed initially with a 6 mm and then an 8-mm poly trial. We placed the knee in extension. We had good stability. We then went ahead and resurfaced the patella going from  a 22 mm thickness down to a 14 mm thickness with the oscillating saw drilling lug holes for the 35 patellar button. We ranged the knee and had excellent patellar tracking with no touch technique. We removed all trial components, pulse irrigated, and  vacuum mixed high viscosity cement on the back table. We dried the bone well prior to cementing all in one step, tibia, femur, and patella. We placed the knee in extension with a size 4 10-mm poly and used  the extension to compress the cement while the  cement set up. We also used a compression clamp for the patella. Once all cement was hard on the back table, we removed excess cement with a  quarter inch curved osteotome. We also injected the anterior capsule with a combination of Marcaine , Exparel  and  saline. We then inspected the entirety of the knee to make sure there was no additional cement. We trialed with the 12 mm and that was exactly what we needed, so we selected a real size 4 12 mm polyethylene on the tibial tray and reduced the knee with a  nice pop as it reduced. We took the knee through a full range of motion, excellent range of motion with normal patellar tracking. We irrigated thoroughly. We then went ahead and repaired the medial parapatellar arthrotomy with #1 Vicryl suture followed  by 0 and 2-0 layered subcutaneous closure and 4-0 running Monocryl for skin. Steri-Strips were applied followed by a sterile dressing. The patient tolerated the surgery well.   PUS D: 12/24/2023 11:30:34 am T: 12/24/2023 11:45:00 am  JOB: [id number]/ [id number]

## 2023-12-24 NOTE — Plan of Care (Signed)
  Problem: Health Behavior/Discharge Planning: Goal: Ability to manage health-related needs will improve Outcome: Progressing   Problem: Clinical Measurements: Goal: Ability to maintain clinical measurements within normal limits will improve Outcome: Progressing Goal: Will remain free from infection Outcome: Progressing Goal: Diagnostic test results will improve Outcome: Progressing Goal: Respiratory complications will improve Outcome: Progressing Goal: Cardiovascular complication will be avoided Outcome: Progressing   Problem: Activity: Goal: Risk for activity intolerance will decrease Outcome: Progressing   Problem: Nutrition: Goal: Adequate nutrition will be maintained Outcome: Progressing   Problem: Coping: Goal: Level of anxiety will decrease Outcome: Progressing   Problem: Elimination: Goal: Will not experience complications related to bowel motility Outcome: Progressing Goal: Will not experience complications related to urinary retention Outcome: Progressing   Problem: Pain Managment: Goal: General experience of comfort will improve and/or be controlled Outcome: Progressing   Problem: Safety: Goal: Ability to remain free from injury will improve Outcome: Progressing   Problem: Skin Integrity: Goal: Risk for impaired skin integrity will decrease Outcome: Progressing   Problem: Education: Goal: Knowledge of the prescribed therapeutic regimen will improve Outcome: Progressing   Problem: Activity: Goal: Ability to avoid complications of mobility impairment will improve Outcome: Progressing   Problem: Clinical Measurements: Goal: Postoperative complications will be avoided or minimized Outcome: Progressing   Problem: Pain Management: Goal: Pain level will decrease with appropriate interventions Outcome: Progressing   Problem: Skin Integrity: Goal: Will show signs of wound healing Outcome: Progressing

## 2023-12-24 NOTE — Anesthesia Procedure Notes (Signed)
 Anesthesia Regional Block: Adductor canal block   Pre-Anesthetic Checklist: , timeout performed,  Correct Patient, Correct Site, Correct Laterality,  Correct Procedure, Correct Position, site marked,  Risks and benefits discussed,  Surgical consent,  Pre-op evaluation,  At surgeon's request and post-op pain management  Laterality: Right  Prep: chloraprep       Needles:  Injection technique: Single-shot  Needle Type: Echogenic Stimulator Needle     Needle Length: 9cm  Needle Gauge: 21     Additional Needles:   Procedures:,,,, ultrasound used (permanent image in chart),,    Narrative:  Start time: 12/24/2023 8:55 AM End time: 12/24/2023 9:00 AM Injection made incrementally with aspirations every 5 mL.  Performed by: Personally  Anesthesiologist: Erma Thom SAUNDERS, MD  Additional Notes: Discussed risks and benefits of the nerve block in detail, including but not limited vascular injury, permanent nerve damage and infection.   Patient tolerated the procedure well. Local anesthetic introduced in an incremental fashion under minimal resistance after negative aspirations. No paresthesias were elicited. After completion of the procedure, no acute issues were identified and patient continued to be monitored by RN.

## 2023-12-24 NOTE — Progress Notes (Signed)
 Orthopedic Tech Progress Note Patient Details:  Vanessa Stewart 09-07-1945 [id number] CPM will be removed at 1600 CPM Right Knee CPM Right Knee: On Right Knee Flexion (Degrees): 90 Right Knee Extension (Degrees): 0  Post Interventions Patient Tolerated: Well Ortho Devices Type of Ortho Device: Bone foam zero knee Ortho Device/Splint Location: Right knee Ortho Device/Splint Interventions: Application   Post Interventions Patient Tolerated: Well  [name] 12/24/2023, 12:05 PM

## 2023-12-24 NOTE — Anesthesia Postprocedure Evaluation (Signed)
 Anesthesia Post Note  Patient: Vanessa Stewart  Procedure(s) Performed: ARTHROPLASTY, KNEE, TOTAL (Right: Knee)     Patient location during evaluation: PACU Anesthesia Type: Spinal Level of consciousness: oriented and awake and alert Pain management: pain level controlled Vital Signs Assessment: post-procedure vital signs reviewed and stable Respiratory status: spontaneous breathing, respiratory function stable and patient connected to nasal cannula oxygen Cardiovascular status: blood pressure returned to baseline and stable Postop Assessment: no headache, no backache and no apparent nausea or vomiting Anesthetic complications: no   No notable events documented.  Last Vitals:  Vitals:   12/24/23 1300 12/24/23 1320  BP: 139/81 138/89  Pulse: 76 72  Resp: 12 15  Temp:    SpO2: 93% 94%    Last Pain:  Vitals:   12/24/23 1300  TempSrc:   PainSc: 2                  [name]

## 2023-12-25 DIAGNOSIS — M1711 Unilateral primary osteoarthritis, right knee: Secondary | ICD-10-CM | POA: Diagnosis not present

## 2023-12-25 NOTE — Care Management Obs Status (Signed)
 MEDICARE OBSERVATION STATUS NOTIFICATION   Patient Details  Name: Vanessa Stewart MRN: [id number] Date of Birth: Jun 25, 1945   Medicare Observation Status Notification Given:  Yes    Cameryn Chrisley A Fulton Merry, LCSW 12/25/2023, 9:30 AM

## 2023-12-25 NOTE — Progress Notes (Signed)
 Subjective: 1 Day Post-Op Procedure(s) (LRB): ARTHROPLASTY, KNEE, TOTAL (Right)  Patient reports pain as moderate to severe.  Notes that she had a tough time sleeping last night due to pain.  Denies fever, chills, N/V, CP, SOB.  Tolerating POs well.  Daughter at bedside  Objective:   VITALS:  Temp:  [97.5 F (36.4 C)-97.9 F (36.6 C)] 97.8 F (36.6 C) (11/15 0537) Pulse Rate:  [67-92] 79 (11/15 0537) Resp:  [11-20] 17 (11/15 0537) BP: (119-174)/(63-107) 147/90 (11/15 0537) SpO2:  [89 %-99 %] 95 % (11/15 0537)  General: WDWN patient in NAD. Psych:  Appropriate mood and affect. Neuro:  A&O x 3, Moving all extremities, sensation intact to light touch HEENT:  EOMs intact Chest:  Even non-labored respirations Skin:  Incision C/D/I, no rashes or lesions Extremities: warm/dry, mild edema to R knee, no erythema or echymosis.  No lymphadenopathy. Pulses: Popliteus 2+ MSK:  ROM: lacks 5 degrees TKE, MMT: able to perform quad set, (-) Homan's    LABS No results for input(s): HGB, WBC, PLT in the last 72 hours. No results for input(s): NA, K, CL, CO2, BUN, CREATININE, GLUCOSE in the last 72 hours. No results for input(s): LABPT, INR in the last 72 hours.   Assessment/Plan: 1 Day Post-Op Procedure(s) (LRB): ARTHROPLASTY, KNEE, TOTAL (Right)  Patient seen in rounds for Dr. Kay Aquacel dressing applied WBAT R LE with walker/cane Up with therapy DVT ppx: ASA D/C home upon clearance of therapy and when pain is under control.  Eva Barrack PA-C [facility] Office:  [phone]

## 2023-12-25 NOTE — Progress Notes (Signed)
 Physical Therapy Treatment Patient Details Name: Vanessa Stewart MRN: [id number] DOB: [birth year] Today's Date: 12/25/2023   History of Present Illness Pt R TKR  and with hx of CAD, RBBB, essential tremor, carotid artery stenosis, and R RTSR (2/25)    PT Comments  Pt very cooperative and progressing with mobility but requiring increased time and fatigues easily.  Pt up to ambulate increased distance into hallway and HEP initiated.  Pt and dtr expressing mixed feelings regarding dc this date based on current needs.  Will follow up in pm.    If plan is discharge home, recommend the following: A little help with walking and/or transfers;A little help with bathing/dressing/bathroom;Assistance with cooking/housework;Assist for transportation;Help with stairs or ramp for entrance   Can travel by private vehicle        Equipment Recommendations  None recommended by PT    Recommendations for Other Services       Precautions / Restrictions Precautions Precautions: Knee;Fall Restrictions Weight Bearing Restrictions Per Provider Order: No RLE Weight Bearing Per Provider Order: Weight bearing as tolerated     Mobility  Bed Mobility Overal bed mobility: Needs Assistance Bed Mobility: Supine to Sit     Supine to sit: Min assist     General bed mobility comments: cues for sequence and use of L LE to self assist    Transfers Overall transfer level: Needs assistance Equipment used: Rolling walker (2 wheels) Transfers: Sit to/from Stand Sit to Stand: Min assist           General transfer comment: cues for LE management and use of UEs to self assist    Ambulation/Gait Ambulation/Gait assistance: Min assist, Contact guard assist Gait Distance (Feet): 39 Feet Assistive device: Rolling walker (2 wheels) Gait Pattern/deviations: Decreased step length - right, Decreased step length - left, Shuffle, Trunk flexed Gait velocity: decr     General Gait Details: Increased time with  cues for sequence, posture and position from [employer]             Wheelchair Mobility     Tilt Bed    Modified Rankin (Stroke Patients Only)       Balance Overall balance assessment: Needs assistance Sitting-balance support: No upper extremity supported, Feet supported Sitting balance-Leahy Scale: Good     Standing balance support: Bilateral upper extremity supported Standing balance-Leahy Scale: Poor                              Communication Communication Communication: No apparent difficulties  Cognition Arousal: Alert Behavior During Therapy: WFL for tasks assessed/performed, Impulsive   PT - Cognitive impairments: Safety/Judgement                         Following commands: Impaired Following commands impaired: Follows one step commands inconsistently    Cueing Cueing Techniques: Gestural cues, Verbal cues, Tactile cues  Exercises Total Joint Exercises Ankle Circles/Pumps: AROM, Both, 15 reps, Supine Quad Sets: AROM, Both, 10 reps, Supine Heel Slides: AAROM, Right, 15 reps, Supine Straight Leg Raises: AAROM, Right, 15 reps, Supine Goniometric ROM: AAROM at R knee -5 - 45    General Comments        Pertinent Vitals/Pain Pain Assessment Pain Assessment: 0-10 Pain Score: 6  Pain Location: R knee Pain Descriptors / Indicators: Aching, Sore Pain Intervention(s): Limited activity within patient's tolerance, Monitored during session, Premedicated before session, Ice applied  Home Living                          Prior Function            PT Goals (current goals can now be found in the care plan section) Acute Rehab PT Goals Patient Stated Goal: Regain IND PT Goal Formulation: With patient Time For Goal Achievement: 12/24/23 Potential to Achieve Goals: Good Progress towards PT goals: Progressing toward goals    Frequency    7X/week      PT Plan      Co-evaluation              AM-PAC PT  6 Clicks Mobility   Outcome Measure  Help needed turning from your back to your side while in a flat bed without using bedrails?: A Little Help needed moving from lying on your back to sitting on the side of a flat bed without using bedrails?: A Little Help needed moving to and from a bed to a chair (including a wheelchair)?: A Little Help needed standing up from a chair using your arms (e.g., wheelchair or bedside chair)?: A Little Help needed to walk in hospital room?: A Little Help needed climbing 3-5 steps with a railing? : A Lot 6 Click Score: 17    End of Session Equipment Utilized During Treatment: Gait belt Activity Tolerance: Patient tolerated treatment well;Patient limited by pain Patient left: in chair;with call bell/phone within reach;with chair alarm set Nurse Communication: Mobility status PT Visit Diagnosis: Unsteadiness on feet (R26.81);Difficulty in walking, not elsewhere classified (R26.2);Pain Pain - Right/Left: Right Pain - part of body: Knee     Time: 0835-0920 PT Time Calculation (min) (ACUTE ONLY): 45 min  Charges:    $Gait Training: 8-22 mins $Therapeutic Exercise: 8-22 mins $Therapeutic Activity: 8-22 mins PT General Charges $$ ACUTE PT VISIT: 1 Visit                     [facility] PT Acute Rehabilitation Services Office [phone]    Khaleb Broz 12/25/2023, 12:15 PM

## 2023-12-26 DIAGNOSIS — Z96651 Presence of right artificial knee joint: Secondary | ICD-10-CM | POA: Diagnosis not present

## 2023-12-26 DIAGNOSIS — M1711 Unilateral primary osteoarthritis, right knee: Secondary | ICD-10-CM | POA: Diagnosis not present

## 2023-12-26 MED ORDER — KETOROLAC TROMETHAMINE 15 MG/ML IJ SOLN
7.5000 mg | Freq: Four times a day (QID) | INTRAMUSCULAR | Status: DC
  Administered 2023-12-26 (×2): 7.5 mg via INTRAVENOUS
  Filled 2023-12-26 (×2): qty 1

## 2023-12-26 NOTE — Progress Notes (Signed)
 Physical Therapy Treatment Patient Details Name: Vanessa Stewart MRN: [id number] DOB: 09-18-1945 Today's Date: 12/26/2023   History of Present Illness Pt R TKR  and with hx of CAD, RBBB, essential tremor, carotid artery stenosis, and R RTSR (2/25)    PT Comments  Pt continues to progress with mobility including exiting bed unassisted, up to ambulate in hall and negotiated stairs - dtr present and written instruction provided.  Pt apprehensive regarding dc home 2* fluctuations in O2 readings - RN aware.    If plan is discharge home, recommend the following: A little help with walking and/or transfers;A little help with bathing/dressing/bathroom;Assistance with cooking/housework;Assist for transportation;Help with stairs or ramp for entrance   Can travel by private vehicle        Equipment Recommendations  None recommended by PT    Recommendations for Other Services       Precautions / Restrictions Precautions Precautions: Knee;Fall Restrictions Weight Bearing Restrictions Per Provider Order: No RLE Weight Bearing Per Provider Order: Weight bearing as tolerated     Mobility  Bed Mobility Overal bed mobility: Needs Assistance Bed Mobility: Supine to Sit     Supine to sit: Supervision Sit to supine: Min assist   General bed mobility comments: cues for sequence and use of gait belt to assist R LE    Transfers Overall transfer level: Needs assistance Equipment used: Rolling walker (2 wheels) Transfers: Sit to/from Stand Sit to Stand: Contact guard assist           General transfer comment: cues for LE management and use of UEs to self assist    Ambulation/Gait Ambulation/Gait assistance: Contact guard assist Gait Distance (Feet): 90 Feet Assistive device: Rolling walker (2 wheels) Gait Pattern/deviations: Decreased step length - right, Decreased step length - left, Shuffle, Trunk flexed Gait velocity: decr     General Gait Details: Increased time with cues  for sequence, posture and position from RW   Stairs Stairs: Yes Stairs assistance: Min assist Stair Management: No rails, Step to pattern, Forwards, With walker Number of Stairs: 2 General stair comments: single step twice fwd with RW   Wheelchair Mobility     Tilt Bed    Modified Rankin (Stroke Patients Only)       Balance Overall balance assessment: Needs assistance Sitting-balance support: No upper extremity supported, Feet supported Sitting balance-Leahy Scale: Good     Standing balance support: Single extremity supported Standing balance-Leahy Scale: Poor                              Communication Communication Communication: No apparent difficulties  Cognition Arousal: Alert Behavior During Therapy: WFL for tasks assessed/performed, Impulsive   PT - Cognitive impairments: Safety/Judgement                         Following commands: Intact Following commands impaired: Follows one step commands inconsistently    Cueing Cueing Techniques: Verbal cues, Gestural cues  Exercises Total Joint Exercises Ankle Circles/Pumps: AROM, Both, 15 reps, Supine Quad Sets: AROM, Both, 10 reps, Supine Heel Slides: AAROM, Right, Supine, 20 reps Straight Leg Raises: AAROM, Right, Supine, 20 reps Goniometric ROM: AAROM R knee -5 - 60    General Comments        Pertinent Vitals/Pain Pain Assessment Pain Assessment: 0-10 Pain Score: 4  Pain Location: R knee Pain Descriptors / Indicators: Aching, Sore Pain Intervention(s): Limited activity within patient's tolerance,  Monitored during session, Premedicated before session    Home Living                          Prior Function            PT Goals (current goals can now be found in the care plan section) Acute Rehab PT Goals Patient Stated Goal: Regain IND PT Goal Formulation: With patient Time For Goal Achievement: 12/24/23 Potential to Achieve Goals: Good Progress towards PT goals:  Progressing toward goals    Frequency    7X/week      PT Plan      Co-evaluation              AM-PAC PT 6 Clicks Mobility   Outcome Measure  Help needed turning from your back to your side while in a flat bed without using bedrails?: A Little Help needed moving from lying on your back to sitting on the side of a flat bed without using bedrails?: A Little Help needed moving to and from a bed to a chair (including a wheelchair)?: A Little Help needed standing up from a chair using your arms (e.g., wheelchair or bedside chair)?: A Little Help needed to walk in hospital room?: A Little Help needed climbing 3-5 steps with a railing? : A Little 6 Click Score: 18    End of Session Equipment Utilized During Treatment: Gait belt Activity Tolerance: Patient tolerated treatment well Patient left: with call bell/phone within reach;in bed;with bed alarm set;with family/visitor present Nurse Communication: Mobility status PT Visit Diagnosis: Unsteadiness on feet (R26.81);Difficulty in walking, not elsewhere classified (R26.2);Pain Pain - Right/Left: Right Pain - part of body: Knee     Time: 1247-1311 PT Time Calculation (min) (ACUTE ONLY): 24 min  Charges:    $Gait Training: 8-22 mins PT General Charges $$ ACUTE PT VISIT: 1 Visit                     [facility] PT Acute Rehabilitation Services Office [phone]    Vanessa Stewart 12/26/2023, 1:28 PM

## 2023-12-26 NOTE — Progress Notes (Signed)
 Patient ID: Vanessa Stewart, female   DOB: [birth year], 78 y.o.   MRN: [id number] Subjective: 2 Days Post-Op Procedure(s) (LRB): ARTHROPLASTY, KNEE, TOTAL (Right)    Patient reports pain as moderate. Not much reported activity as of now No events over night  Objective:   VITALS:   Vitals:   12/25/23 2128 12/26/23 0545  BP: (!) 164/89 (!) 159/75  Pulse: 86 83  Resp: 18 14  Temp: 97.9 F (36.6 C) 98 F (36.7 C)  SpO2: 92% 98%    Neurovascular intact Incision: dressing C/D/I  LABS No results for input(s): HGB, HCT, WBC, PLT in the last 72 hours.  No results for input(s): NA, K, BUN, CREATININE, GLUCOSE in the last 72 hours.  No results for input(s): LABPT, INR in the last 72 hours.   Assessment/Plan: 2 Days Post-Op Procedure(s) (LRB): ARTHROPLASTY, KNEE, TOTAL (Right)   Up with therapy Nursing can remove her ACE wrap today Discharge pending increased safe OOB activity as reviewed Will add short course low dose toradol  to help with pain Contact Rosina Calin if discharge possible today

## 2023-12-26 NOTE — Progress Notes (Signed)
 Physical Therapy Treatment Patient Details Name: Vanessa Stewart MRN: [id number] DOB: 05-10-1945 Today's Date: 12/26/2023   History of Present Illness Pt R TKR  and with hx of CAD, RBBB, essential tremor, carotid artery stenosis, and R RTSR (2/25)    PT Comments  Pt very cooperative, with pain better controlled and reports able to sleep last night.  Pt performed therex program with assist and improved tolerance.  Gait training deferred to later on arrival of bfast.  Pt hopeful for dc home this date.    If plan is discharge home, recommend the following: A little help with walking and/or transfers;A little help with bathing/dressing/bathroom;Assistance with cooking/housework;Assist for transportation;Help with stairs or ramp for entrance   Can travel by private vehicle        Equipment Recommendations  None recommended by PT    Recommendations for Other Services       Precautions / Restrictions Precautions Precautions: Knee;Fall Restrictions Weight Bearing Restrictions Per Provider Order: No     Mobility  Bed Mobility               General bed mobility comments: Pt up in chair    Transfers                        Ambulation/Gait                   Stairs             Wheelchair Mobility     Tilt Bed    Modified Rankin (Stroke Patients Only)       Balance                                            Communication Communication Communication: No apparent difficulties  Cognition Arousal: Alert Behavior During Therapy: WFL for tasks assessed/performed, Impulsive   PT - Cognitive impairments: Safety/Judgement                         Following commands: Intact      Cueing Cueing Techniques: Verbal cues, Gestural cues  Exercises Total Joint Exercises Ankle Circles/Pumps: AROM, Both, 15 reps, Supine Quad Sets: AROM, Both, 10 reps, Supine Heel Slides: AAROM, Right, Supine, 20 reps Straight Leg  Raises: AAROM, Right, Supine, 20 reps Goniometric ROM: AAROM R knee -5 - 60    General Comments        Pertinent Vitals/Pain Pain Assessment Pain Assessment: 0-10 Pain Score: 4  Pain Location: R knee Pain Descriptors / Indicators: Aching, Sore Pain Intervention(s): Limited activity within patient's tolerance, Monitored during session, Premedicated before session, Ice applied    Home Living                          Prior Function            PT Goals (current goals can now be found in the care plan section) Acute Rehab PT Goals Patient Stated Goal: Regain IND PT Goal Formulation: With patient Time For Goal Achievement: 12/24/23 Potential to Achieve Goals: Good Progress towards PT goals: Progressing toward goals    Frequency    7X/week      PT Plan      Co-evaluation  AM-PAC PT 6 Clicks Mobility   Outcome Measure  Help needed turning from your back to your side while in a flat bed without using bedrails?: A Little Help needed moving from lying on your back to sitting on the side of a flat bed without using bedrails?: A Little Help needed moving to and from a bed to a chair (including a wheelchair)?: A Little Help needed standing up from a chair using your arms (e.g., wheelchair or bedside chair)?: A Little Help needed to walk in hospital room?: A Little Help needed climbing 3-5 steps with a railing? : A Lot 6 Click Score: 17    End of Session Equipment Utilized During Treatment: Gait belt Activity Tolerance: Patient tolerated treatment well;Patient limited by pain Patient left: in chair;with call bell/phone within reach;with chair alarm set Nurse Communication: Mobility status PT Visit Diagnosis: Unsteadiness on feet (R26.81);Difficulty in walking, not elsewhere classified (R26.2);Pain Pain - Right/Left: Right Pain - part of body: Knee     Time: 9175-9159 PT Time Calculation (min) (ACUTE ONLY): 16 min  Charges:     $Therapeutic Exercise: 8-22 mins PT General Charges $$ ACUTE PT VISIT: 1 Visit                     [facility] PT Acute Rehabilitation Services Office [phone]    Oluwatosin Bracy 12/26/2023, 8:41 AM

## 2023-12-26 NOTE — Progress Notes (Signed)
Patient discharged to home w/ family. Given all belongings, instructions. Verbalized understanding of instructions. Escorted to pov via w/c. 

## 2023-12-26 NOTE — Progress Notes (Signed)
 Physical Therapy Treatment Patient Details Name: Vanessa Stewart MRN: [id number] DOB: 01/06/1946 Today's Date: 12/26/2023   History of Present Illness Pt R TKR  and with hx of CAD, RBBB, essential tremor, carotid artery stenosis, and R RTSR (2/25)    PT Comments  Pt continues cooperative with marked improvement in pain control and progressing well with mobility.  Pt up to ambulate increased distance in hall with marked improvement in stability.  Will follow up with stair training.   If plan is discharge home, recommend the following: A little help with walking and/or transfers;A little help with bathing/dressing/bathroom;Assistance with cooking/housework;Assist for transportation;Help with stairs or ramp for entrance   Can travel by private vehicle        Equipment Recommendations  None recommended by PT    Recommendations for Other Services       Precautions / Restrictions Precautions Precautions: Knee;Fall Restrictions Weight Bearing Restrictions Per Provider Order: No RLE Weight Bearing Per Provider Order: Weight bearing as tolerated     Mobility  Bed Mobility Overal bed mobility: Needs Assistance Bed Mobility: Sit to Supine       Sit to supine: Min assist   General bed mobility comments: cues for sequence and min assist for R LE    Transfers Overall transfer level: Needs assistance Equipment used: Rolling walker (2 wheels) Transfers: Sit to/from Stand Sit to Stand: Contact guard assist           General transfer comment: cues for LE management and use of UEs to self assist    Ambulation/Gait Ambulation/Gait assistance: Contact guard assist Gait Distance (Feet): 100 Feet Assistive device: Rolling walker (2 wheels) Gait Pattern/deviations: Decreased step length - right, Decreased step length - left, Shuffle, Trunk flexed Gait velocity: decr     General Gait Details: Increased time with cues for sequence, posture and position from [employer]              Wheelchair Mobility     Tilt Bed    Modified Rankin (Stroke Patients Only)       Balance Overall balance assessment: Needs assistance Sitting-balance support: No upper extremity supported, Feet supported Sitting balance-Leahy Scale: Good     Standing balance support: Single extremity supported Standing balance-Leahy Scale: Poor                              Communication Communication Communication: No apparent difficulties  Cognition Arousal: Alert Behavior During Therapy: WFL for tasks assessed/performed, Impulsive   PT - Cognitive impairments: Safety/Judgement                         Following commands: Intact Following commands impaired: Follows one step commands inconsistently    Cueing Cueing Techniques: Verbal cues, Gestural cues  Exercises Total Joint Exercises Ankle Circles/Pumps: AROM, Both, 15 reps, Supine Quad Sets: AROM, Both, 10 reps, Supine Heel Slides: AAROM, Right, Supine, 20 reps Straight Leg Raises: AAROM, Right, Supine, 20 reps Goniometric ROM: AAROM R knee -5 - 60    General Comments        Pertinent Vitals/Pain Pain Assessment Pain Assessment: 0-10 Pain Score: 4  Pain Location: R knee Pain Descriptors / Indicators: Aching, Sore Pain Intervention(s): Limited activity within patient's tolerance    Home Living  Prior Function            PT Goals (current goals can now be found in the care plan section) Acute Rehab PT Goals Patient Stated Goal: Regain IND PT Goal Formulation: With patient Time For Goal Achievement: 12/24/23 Potential to Achieve Goals: Good Progress towards PT goals: Progressing toward goals    Frequency    7X/week      PT Plan      Co-evaluation              AM-PAC PT 6 Clicks Mobility   Outcome Measure  Help needed turning from your back to your side while in a flat bed without using bedrails?: A Little Help needed moving  from lying on your back to sitting on the side of a flat bed without using bedrails?: A Little Help needed moving to and from a bed to a chair (including a wheelchair)?: A Little Help needed standing up from a chair using your arms (e.g., wheelchair or bedside chair)?: A Little Help needed to walk in hospital room?: A Little Help needed climbing 3-5 steps with a railing? : A Lot 6 Click Score: 17    End of Session Equipment Utilized During Treatment: Gait belt Activity Tolerance: Patient tolerated treatment well Patient left: with call bell/phone within reach;in bed;with bed alarm set;with family/visitor present Nurse Communication: Mobility status PT Visit Diagnosis: Unsteadiness on feet (R26.81);Difficulty in walking, not elsewhere classified (R26.2);Pain Pain - Right/Left: Right Pain - part of body: Knee     Time: 9066-9047 PT Time Calculation (min) (ACUTE ONLY): 19 min  Charges:    $Gait Training: 8-22 mins PT General Charges $$ ACUTE PT VISIT: 1 Visit                     [facility] PT Acute Rehabilitation Services Office [phone]    Anelis Hrivnak 12/26/2023, 1:22 PM

## 2023-12-27 ENCOUNTER — Encounter (HOSPITAL_COMMUNITY): Payer: Self-pay | Admitting: Orthopedic Surgery

## 2023-12-27 DIAGNOSIS — M25561 Pain in right knee: Secondary | ICD-10-CM | POA: Diagnosis not present

## 2023-12-29 DIAGNOSIS — M25561 Pain in right knee: Secondary | ICD-10-CM | POA: Diagnosis not present

## 2023-12-31 DIAGNOSIS — M25561 Pain in right knee: Secondary | ICD-10-CM | POA: Diagnosis not present

## 2024-01-05 DIAGNOSIS — Z4789 Encounter for other orthopedic aftercare: Secondary | ICD-10-CM | POA: Diagnosis not present

## 2024-01-05 DIAGNOSIS — M25561 Pain in right knee: Secondary | ICD-10-CM | POA: Diagnosis not present

## 2024-01-10 DIAGNOSIS — M25561 Pain in right knee: Secondary | ICD-10-CM | POA: Diagnosis not present

## 2024-01-10 NOTE — Discharge Summary (Signed)
 Patient ID: SESILIA POUCHER MRN: [id number] DOB/AGE: 11-04-1945 78 y.o.  Admit date: 12/24/2023 Discharge date: 12/26/2023  Admission Diagnoses:  Right knee osteoarthritis  Discharge Diagnoses:  Principal Problem:   Status post total knee replacement, right   Past Medical History:  Diagnosis Date   Allergy    Anxiety    Arthritis    Carotid artery stenosis 07/03/2015   1-39% bilateral by  dopplers 07/2015   Coronary artery disease    Depression    Essential tremor    Headache    Hyperlipidemia    Hypertension    Hypothyroidism    Osteopenia    PONV (postoperative nausea and vomiting)    Pre-diabetes    Prinzmetal's angina    normal coronary arteries with coronary artery vasospasm at time of cath   RBBB    Chronic    Surgeries: Procedure(s): ARTHROPLASTY, KNEE, TOTAL on 12/24/2023   Consultants:   Discharged Condition: Improved  Hospital Course: ZETHA KUHAR is an 78 y.o. female who was admitted 12/24/2023 for operative treatment ofStatus post total knee replacement, right. Patient has severe unremitting pain that affects sleep, daily activities, and work/hobbies. After pre-op clearance the patient was taken to the operating room on 12/24/2023 and underwent  Procedure(s): ARTHROPLASTY, KNEE, TOTAL.    Patient was given perioperative antibiotics:  Anti-infectives (From admission, onward)    Start     Dose/Rate Route Frequency Ordered Stop   12/24/23 1530  ceFAZolin  (ANCEF ) IVPB 2g/100 mL premix        2 g 200 mL/hr over 30 Minutes Intravenous Every 6 hours 12/24/23 1308 12/24/23 2141   12/24/23 0745  ceFAZolin  (ANCEF ) IVPB 2g/100 mL premix        2 g 200 mL/hr over 30 Minutes Intravenous On call to O.R. 12/24/23 9266 12/24/23 0937        Patient was given sequential compression devices, early ambulation, and chemoprophylaxis to prevent DVT. Patient worked with PT and was meeting their goals regarding safe ambulation and transfers.  Patient benefited  maximally from hospital stay and there were no complications.    Recent vital signs: No data found.   Recent laboratory studies: No results for input(s): WBC, HGB, HCT, PLT, NA, K, CL, CO2, BUN, CREATININE, GLUCOSE, INR, CALCIUM  in the last 72 hours.  Invalid input(s): PT, 2   Discharge Medications:   Allergies as of 12/26/2023       Reactions   Elemental Sulfur Other (See Comments)   GI Issues   Fosamax [alendronate] Other (See Comments)   Jaw pain   Lipitor [atorvastatin ] Other (See Comments)   Myalgias   Pravachol  [pravastatin ] Other (See Comments)   Myalgias   Septra [sulfamethoxazole-trimethoprim] Other (See Comments)   GI issues   Sulfa Antibiotics Nausea And Vomiting   GI issues   Zetia  [ezetimibe ] Other (See Comments)   Myalgias   Crestor  [rosuvastatin ] Swelling, Other (See Comments)   Myalgias         Medication List     TAKE these medications    acidophilus Caps capsule Take 1 capsule by mouth daily.   ALPRAZolam  0.5 MG tablet Commonly known as: XANAX  Take 0.5 mg by mouth 2 (two) times daily as needed for anxiety.   amLODipine  2.5 MG tablet Commonly known as: NORVASC  Take 2.5 mg by mouth daily.   aspirin  81 MG tablet Take 1 tablet (81 mg total) by mouth in the morning and at bedtime. What changed: when to take this   BLACK ELDERBERRY  PO Take 1 capsule by mouth daily.   CALCIUM  + D3 PO Take 2 tablets by mouth in the morning.   carboxymethylcellulose 0.5 % Soln Commonly known as: REFRESH PLUS Place 1 drop into both eyes daily.   cetirizine 10 MG tablet Commonly known as: ZYRTEC Take 10 mg by mouth in the morning.   chlorhexidine  4 % external liquid Commonly known as: HIBICLENS  Apply 15 mLs (1 Application total) topically as directed for 30 doses. Use as directed daily for 5 days every other week for 6 weeks.   ezetimibe  10 MG tablet Commonly known as: ZETIA  Take 10 mg by mouth daily.   famotidine  20 MG  tablet Commonly known as: PEPCID  Take 20 mg by mouth daily.   Fish Oil 1200 MG Caps Take 1,200 mg by mouth every evening.   guaiFENesin  600 MG 12 hr tablet Commonly known as: MUCINEX  Take 600 mg by mouth 2 (two) times daily as needed for cough or to loosen phlegm.   levothyroxine  50 MCG tablet Commonly known as: SYNTHROID  Take 50 mcg by mouth daily before breakfast.   lisinopril -hydrochlorothiazide  20-25 MG tablet Commonly known as: ZESTORETIC  Take 1 tablet by mouth daily.   mupirocin  ointment 2 % Commonly known as: BACTROBAN  Place 1 Application into the nose 2 (two) times daily for 60 doses. Use as directed 2 times daily for 5 days every other week for 6 weeks.   OCUVITE ADULT 50+ PO Take 1 tablet by mouth daily.   ondansetron  4 MG tablet Commonly known as: Zofran  Take 1 tablet (4 mg total) by mouth every 8 (eight) hours as needed for nausea, vomiting or refractory nausea / vomiting.   PARoxetine  20 MG tablet Commonly known as: PAXIL  Take 20 mg by mouth daily.   potassium chloride  SA 20 MEQ tablet Commonly known as: KLOR-CON  M Take 1 tablet (20 mEq total) by mouth 2 (two) times daily. Please make overdue appt with Dr. Shlomo before anymore refills. 1st attempt   predniSONE  10 MG tablet Commonly known as: DELTASONE  Prednisone  10mg  : Take 2 tablets twice a day for 3 more days, Then take 2 tablets once a day for 1 day., then take 1 tablet once a day for 1 day.   risedronate  35 MG tablet Commonly known as: ACTONEL  Take 35 mg by mouth every Saturday.   rosuvastatin  5 MG tablet Commonly known as: CRESTOR  Take 5 mg by mouth daily.               Discharge Care Instructions  (From admission, onward)           Start     Ordered   12/26/23 0000  Change dressing       Comments: Maintain surgical dressing until follow up in the clinic. If the edges start to pull up, may reinforce with tape. If the dressing is no longer working, may remove and cover with gauze  and tape, but must keep the area dry and clean.  Call with any questions or concerns.   12/26/23 1340            Diagnostic Studies: No results found.  Disposition: Discharge disposition: 01-Home or Self Care       Discharge Instructions     Call MD / Call 911   Complete by: As directed    If you experience chest pain or shortness of breath, CALL 911 and be transported to the hospital emergency room.  If you develope a fever above 101 F, pus (white  drainage) or increased drainage or redness at the wound, or calf pain, call your surgeon's office.   Change dressing   Complete by: As directed    Maintain surgical dressing until follow up in the clinic. If the edges start to pull up, may reinforce with tape. If the dressing is no longer working, may remove and cover with gauze and tape, but must keep the area dry and clean.  Call with any questions or concerns.   Constipation Prevention   Complete by: As directed    Drink plenty of fluids.  Prune juice may be helpful.  You may use a stool softener, such as Colace (over the counter) 100 mg twice a day.  Use MiraLax  (over the counter) for constipation as needed.   Diet - low sodium heart healthy   Complete by: As directed    Increase activity slowly as tolerated   Complete by: As directed    Weight bearing as tolerated with assist device (walker, cane, etc) as directed, use it as long as suggested by your surgeon or therapist, typically at least 4-6 weeks.   Post-operative opioid taper instructions:   Complete by: As directed    POST-OPERATIVE OPIOID TAPER INSTRUCTIONS: It is important to wean off of your opioid medication as soon as possible. If you do not need pain medication after your surgery it is ok to stop day one. Opioids include: Codeine, Hydrocodone (Norco, Vicodin), Oxycodone (Percocet, oxycontin ) and hydromorphone amongst others.  Long term and even short term use of opiods can cause: Increased pain  response Dependence Constipation Depression Respiratory depression And more.  Withdrawal symptoms can include Flu like symptoms Nausea, vomiting And more Techniques to manage these symptoms Hydrate well Eat regular healthy meals Stay active Use relaxation techniques(deep breathing, meditating, yoga) Do Not substitute Alcohol  to help with tapering If you have been on opioids for less than two weeks and do not have pain than it is ok to stop all together.  Plan to wean off of opioids This plan should start within one week post op of your joint replacement. Maintain the same interval or time between taking each dose and first decrease the dose.  Cut the total daily intake of opioids by one tablet each day Next start to increase the time between doses. The last dose that should be eliminated is the evening dose.      TED hose   Complete by: As directed    Use stockings (TED hose) for 2 weeks on both leg(s).  You may remove them at night for sleeping.        Follow-up Information     Melvenia Debby Cain, PA-C. Go on 01/05/2024.   Specialty: Physician Assistant Why: You are scheduled for first postop appointment on Wednesday November 26 at 1:30pm. Contact information: [address] [place] [address] [phone]                  Signed: Rosina JONELLE Calin 01/10/2024, 4:07 PM

## 2024-01-11 NOTE — Telephone Encounter (Signed)
 APPROVED-Start Date:12/20/2023  -Expiration Date:06/17/2024 Authorization Number: [id number] Case Number: [id number]

## 2024-01-13 DIAGNOSIS — R300 Dysuria: Secondary | ICD-10-CM | POA: Diagnosis not present

## 2024-01-17 DIAGNOSIS — M25561 Pain in right knee: Secondary | ICD-10-CM | POA: Diagnosis not present

## 2024-01-21 DIAGNOSIS — M25561 Pain in right knee: Secondary | ICD-10-CM | POA: Diagnosis not present

## 2024-01-25 DIAGNOSIS — M25561 Pain in right knee: Secondary | ICD-10-CM | POA: Diagnosis not present

## 2024-01-27 DIAGNOSIS — M25561 Pain in right knee: Secondary | ICD-10-CM | POA: Diagnosis not present

## 2024-02-16 ENCOUNTER — Telehealth: Payer: Self-pay | Admitting: Cardiology

## 2024-02-16 NOTE — Telephone Encounter (Signed)
*  STAT* If patient is at the pharmacy, call can be transferred to refill team.   1. Which medications need to be refilled? (please list name of each medication and dose if known) rosuvastatin (CRESTOR) 5 MG tablet   2. Which pharmacy/location (including street and city if local pharmacy) is medication to be sent to? [facility] [place] to [facility]   3. Do they need a 30 day or 90 day supply? [place]

## 2024-02-18 MED ORDER — ROSUVASTATIN CALCIUM 5 MG PO TABS: 5.0000 mg | ORAL_TABLET | Freq: Every day | ORAL | 1 refills | Status: AC

## 2024-02-18 NOTE — Telephone Encounter (Signed)
 Refills has been sent to the pharmacy.

## 2024-02-24 ENCOUNTER — Telehealth: Payer: Self-pay | Admitting: Cardiology

## 2024-02-24 NOTE — Telephone Encounter (Signed)
 Pt would like to know if she's able to take any over the counter medication for pain.

## 2024-02-25 ENCOUNTER — Telehealth: Payer: Self-pay | Admitting: Cardiology

## 2024-02-25 NOTE — Telephone Encounter (Signed)
 Spoke with pt, aware of the recommendations. She is having knee pain during the night.

## 2024-02-25 NOTE — Telephone Encounter (Signed)
 Pt is confused on what her next steps after sleep study. Pt states she already had study. Please advise.

## 2024-02-25 NOTE — Telephone Encounter (Signed)
 Pt calling again to f/u on this question. Please advise.

## 2024-02-25 NOTE — Telephone Encounter (Addendum)
**Note De-Identified Vanessa Stewart Obfuscation** I called the pt and advised her that Dr Shlomo recommended that she have a CPAP Titration after her home sleep study as it did show that she has OSA. I advised her that we have done a PA trough [employer] that was approved until 06/17/2024.  She stated that she does not want to do the CPAP Titration any time soon as she just had knee surgery and is in a lot of pain at night.  I advised her that when the Sleep Lab calls her to to schedule her CPAP Titration, to take the latest appointment they offer her that does not go past the date of 06/17/2024 if she is feeling better at that time.  She verbalized understanding, is in agreement with plan and thanked me for calling her back.

## 2024-05-01 ENCOUNTER — Ambulatory Visit: Admitting: Cardiology
